# Patient Record
Sex: Male | Born: 1955 | Race: White | Hispanic: No | Marital: Married | Smoking: Former smoker
Health system: Southern US, Community
[De-identification: ages and names within clinical notes are randomized; demographics above are authoritative.]

## PROBLEM LIST (undated history)

## (undated) DIAGNOSIS — I219 Acute myocardial infarction, unspecified: Secondary | ICD-10-CM

## (undated) DIAGNOSIS — I10 Essential (primary) hypertension: Secondary | ICD-10-CM

## (undated) DIAGNOSIS — M1990 Unspecified osteoarthritis, unspecified site: Secondary | ICD-10-CM

## (undated) DIAGNOSIS — E119 Type 2 diabetes mellitus without complications: Secondary | ICD-10-CM

## (undated) DIAGNOSIS — K219 Gastro-esophageal reflux disease without esophagitis: Secondary | ICD-10-CM

## (undated) DIAGNOSIS — I2109 ST elevation (STEMI) myocardial infarction involving other coronary artery of anterior wall: Secondary | ICD-10-CM

## (undated) DIAGNOSIS — I2510 Atherosclerotic heart disease of native coronary artery without angina pectoris: Secondary | ICD-10-CM

## (undated) DIAGNOSIS — F419 Anxiety disorder, unspecified: Secondary | ICD-10-CM

## (undated) DIAGNOSIS — L4050 Arthropathic psoriasis, unspecified: Secondary | ICD-10-CM

## (undated) HISTORY — PX: CARDIAC CATHETERIZATION: SHX172

## (undated) HISTORY — PX: COLONOSCOPY: SHX174

## (undated) HISTORY — DX: Type 2 diabetes mellitus without complications: E11.9

## (undated) HISTORY — PX: EYE SURGERY: SHX253

## (undated) HISTORY — DX: Essential (primary) hypertension: I10

## (undated) HISTORY — PX: OTHER SURGICAL HISTORY: SHX169

## (undated) HISTORY — DX: Atherosclerotic heart disease of native coronary artery without angina pectoris: I25.10

## (undated) HISTORY — PX: KNEE ARTHROPLASTY: SHX992

---
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
## 1993-05-25 DIAGNOSIS — I2109 ST elevation (STEMI) myocardial infarction involving other coronary artery of anterior wall: Secondary | ICD-10-CM

## 1993-05-25 HISTORY — DX: ST elevation (STEMI) myocardial infarction involving other coronary artery of anterior wall: I21.09

## 1999-07-31 ENCOUNTER — Ambulatory Visit (HOSPITAL_COMMUNITY): Admission: RE | Admit: 1999-07-31 | Discharge: 1999-07-31 | Payer: Self-pay | Admitting: Cardiovascular Disease

## 2007-01-19 ENCOUNTER — Ambulatory Visit: Payer: Self-pay | Admitting: Cardiology

## 2007-03-09 ENCOUNTER — Encounter: Admission: RE | Admit: 2007-03-09 | Discharge: 2007-03-09 | Payer: Self-pay | Admitting: Rheumatology

## 2008-08-05 IMAGING — CR DG CHEST 2V
2 series · 2 of 2 positions shown · non-contrast
Comparison: None.

CLINICAL DATA: TB screening.  Ex-smoker.
 CHEST - 2 VIEW:

[w chest pa]
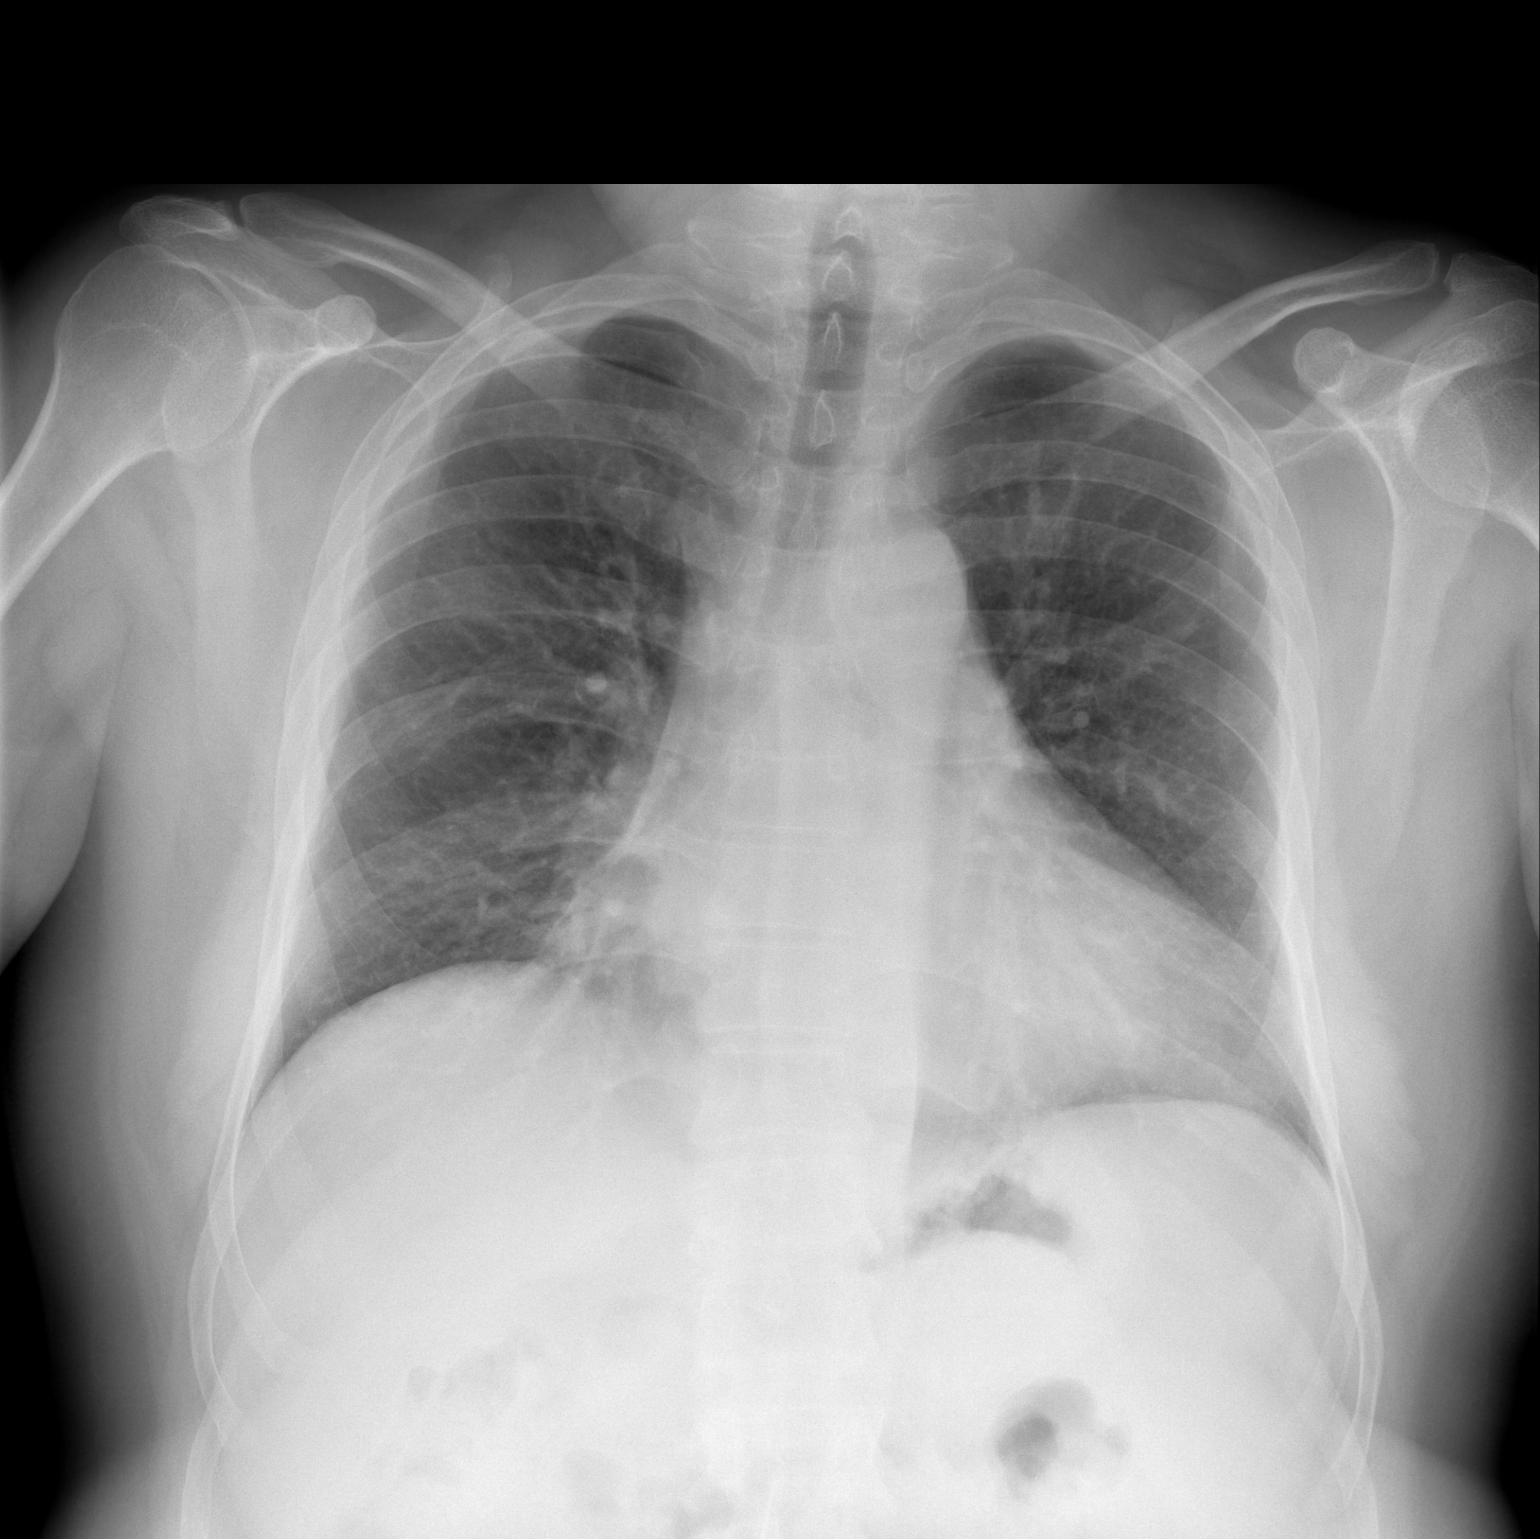

[w chest lat]
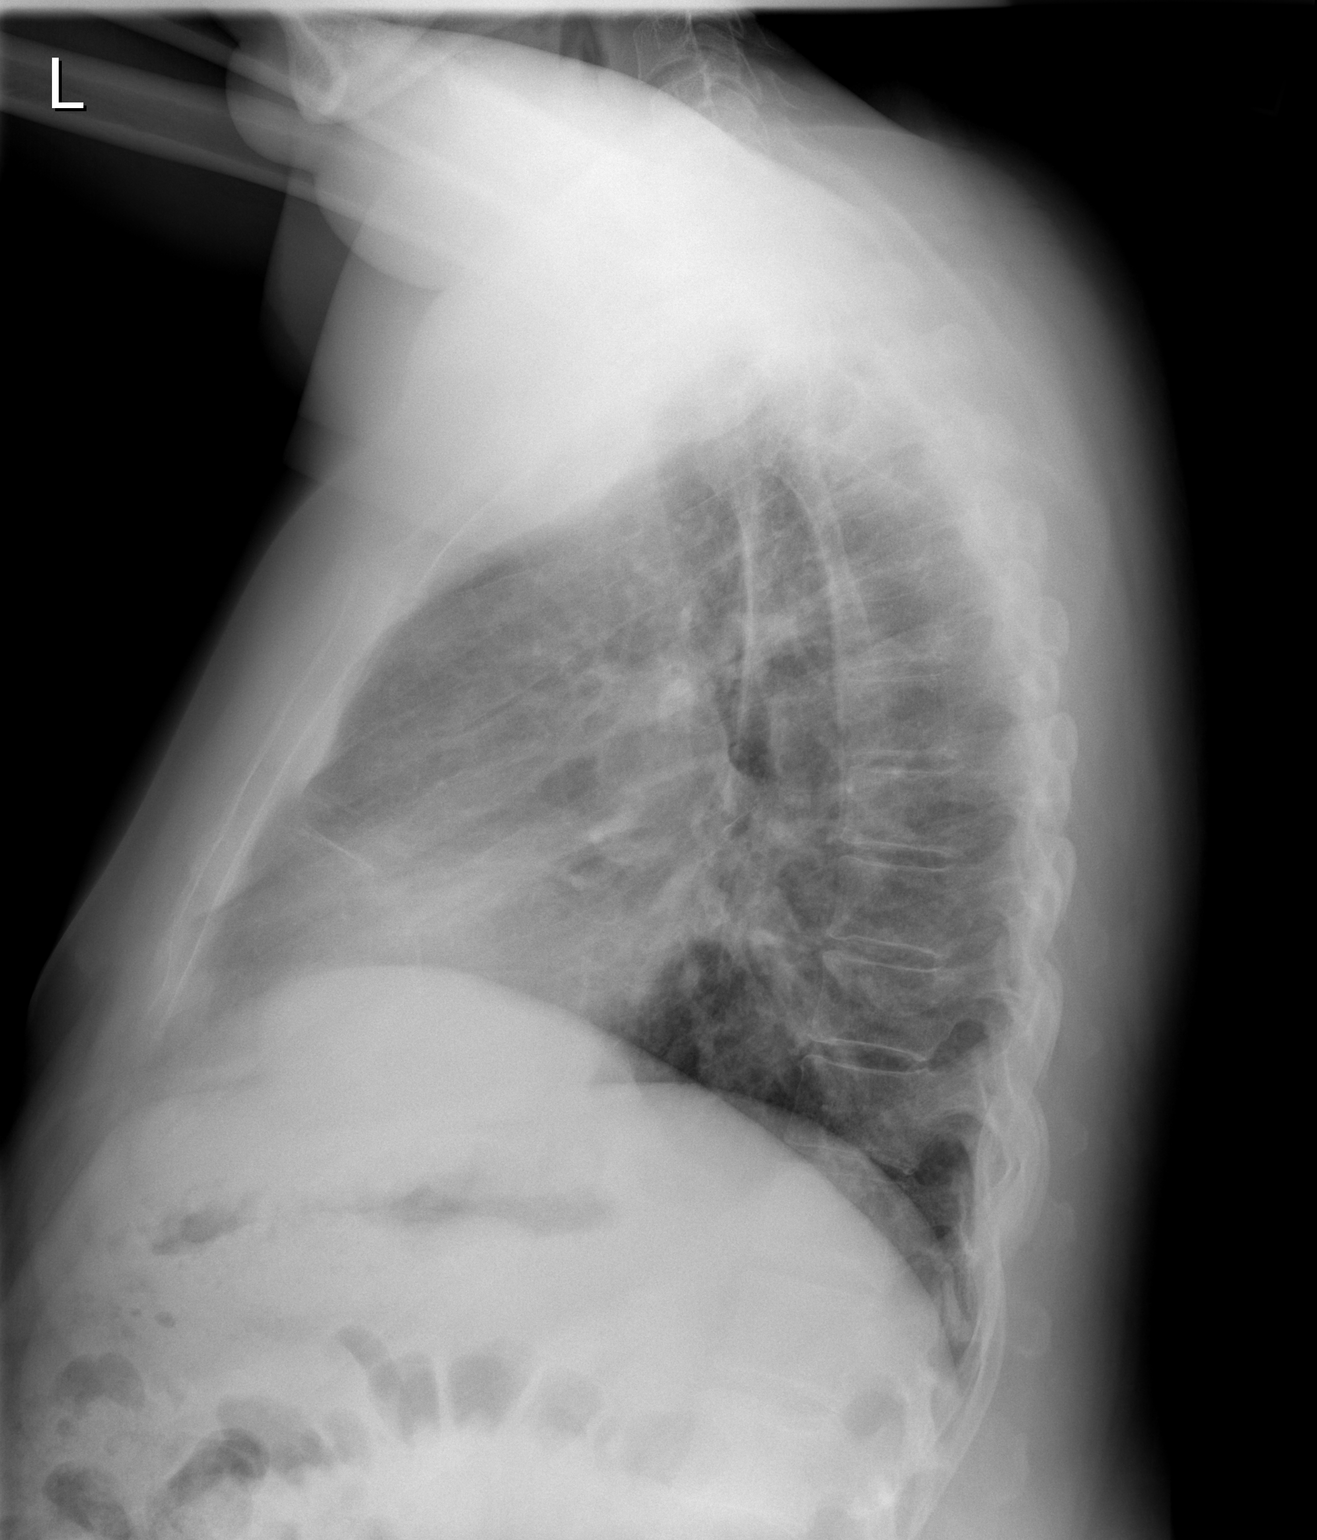

[2 of 2 positions shown; findings below may reference images not displayed]

FINDINGS: Trachea is midline.  Heart size is mildly enlarged.   There is a scar anteriorly on the lateral view.    Lungs are otherwise clear.  No pleural fluid.
IMPRESSION: No acute findings.   No evidence of active tuberculosis.

## 2010-10-10 NOTE — Cardiovascular Report (Signed)
[place]. [facility]  Patient:    Brandon Frazier, Brandon Frazier                          MRN: [id number] Adm. Date:  [id number] Disc. Date: [id number] Attending:  Colon Branch CC:         Dr. Margo Common                        Cardiac Catheterization  INDICATIONS:  Previous angioplasty of the left anterior descending artery in 1995 with an acute anterior MI, recurrent chest pain.  FINDINGS: 1. The left main coronary artery had 20% discrete stenosis. 2. The left anterior descending artery was widely patent at the site of the    previous angioplasty site, and there was 30% multiple discrete lesions in the    mid and distal vessel. 3. The diagonal branches were all small and unchanged from previously. 4. The circumflex coronary artery consisted primarily of one large obtuse marginal    branch which was normal. 5. The right coronary artery was small in caliber.  There were no significant    stenoses.  There was one posterolateral branch and one PDA.  An RAO ventriculography revealed mild anterior apical wall hypokinesis which is  unchanged from previous catheterization.  The ejection fraction is in the 50% range.  There was no gradient across the aortic valve and no MR.  HEMODYNAMIC DATA:  Left ventricular pressure was 130/21, and aortic pressure was 138/74.  IMPRESSION:  The patients chest pain would appear to be noncardiac.  He had no restenosis in his LAD and has not progressed any disease in his right coronary r circumflex.  Continued medical therapy is warranted.  We did Perclose the right  femoral artery at the end of the case with moderately good hemostasis.  The patient had a small hematoma.  He will be watched in the day hospital area and probably  discharged later today.  One gram of Ancef was given in the lab. DD:  07/31/99 TD:  08/01/99 Job: [id number]

## 2010-10-30 ENCOUNTER — Encounter: Payer: Self-pay | Admitting: Family Medicine

## 2010-10-30 ENCOUNTER — Ambulatory Visit (INDEPENDENT_AMBULATORY_CARE_PROVIDER_SITE_OTHER): Payer: BC Managed Care – PPO | Admitting: Family Medicine

## 2010-10-30 VITALS — BP 127/70 | HR 38 | Ht 68.0 in | Wt 175.0 lb

## 2010-10-30 DIAGNOSIS — M25552 Pain in left hip: Secondary | ICD-10-CM | POA: Insufficient documentation

## 2010-10-30 DIAGNOSIS — M25559 Pain in unspecified hip: Secondary | ICD-10-CM

## 2010-10-30 DIAGNOSIS — M25562 Pain in left knee: Secondary | ICD-10-CM

## 2010-10-30 DIAGNOSIS — M25569 Pain in unspecified knee: Secondary | ICD-10-CM

## 2010-10-30 DIAGNOSIS — M2170 Unequal limb length (acquired), unspecified site: Secondary | ICD-10-CM

## 2010-10-30 DIAGNOSIS — M25561 Pain in right knee: Secondary | ICD-10-CM | POA: Insufficient documentation

## 2010-10-30 DIAGNOSIS — M25551 Pain in right hip: Secondary | ICD-10-CM

## 2010-10-30 NOTE — Assessment & Plan Note (Signed)
Rt leg 1.5cm shorter than the left - likely contributing to knee & hip pain. - Heel wedge placed in right shoe to correct difference - this was comfortable in office & provided adequate correction.  Should wear this in all shoes. - Advance activity as tolerated. - f/u prn

## 2010-10-30 NOTE — Assessment & Plan Note (Signed)
Bilateral hip pain likely secondary to leg length difference, although noted to have tight hip muscles. - Heel lift given as noted above - Start lower extremity stretches as shown - Advance activity as tolerated - f/u prn

## 2010-10-30 NOTE — Progress Notes (Signed)
Subjective:    Patient ID: Brandon Frazier, male    DOB: 16-May-1956, 55 y.o.   MRN: [id number]  Knee Pain   Hip Pain    Brandon Frazier present for bilateral knee pain x 2-3 months, with L > R. He is an avid runner but can no longer run one block without experiencing pain.  Pain is located on medial aspects of the knees. Pain flares periodically and is worse with running vs walking. Pt denies any injury but has a history of L knee arthroscopy in the past due to meniscal dysfunction x 2, by Dr Sharion Settler.  He has had Synvisc injections in the last year, but he is not sure if they were helpful. He also has noticed bilateral hip pain, esp with standing for long period of time or getting out of a car.  He takes Aleve occasionally, which has helped, but he is concerned about taking this for an extended period of time.  He has a history of psoriatic arthritis and receiving methotrexate and embrel injections under the care of Dr Nicholes Rough.  PMH: psoriatic arthritis, CAD, HTN, GERD, hyperlipidemia PSH: Lt knee arthroscopy x 2 as noted above ALL: NKDA  Review of Systems  No fever, chills, falling, injury    Objective:   Physical Exam  GEN:  Nad, ao x 3 SKIN: no rashes/lesions RESP: respirations 15 & unlabored MUSC: - Knees: No swelling or redness on exam.  No effusion. No tenderness along joint line.  FROM with mild patello-femoral crepitus.  No ligamentous laxity.  Neg McMurray, neg patellar apprehension, neg patellar grind. - Hips: Palpation did not elicit pain at hip joint, greater troch, or piriformis.  Slightly limited hip internal rotation - Lt >Rt, otherwise Hip flexion and extension intact.  Hip adductors and abductors with good strength.  Neg FABER, neg Ober b/l. - Feet: moderate arch, no foot breakdown - Gait: walks with right foot externally rotated.  Elevation of left hemi-pelvis & noted to have left leg 1.5cm longer than right.   Neurovascularly intact distally.  Assessment & Plan:

## 2010-10-30 NOTE — Progress Notes (Signed)
Subjective:    Patient ID: Brandon Frazier, male    DOB: Jan 10, 1956, 55 y.o.   MRN: [id number]  HPI Brandon Frazier present for bilateral knee pain x 2-3 months, with L > R. He is an avid runner but can no longer run one block without experiencing pain.  Pain is located on medial aspects of the knees. Pain flares periodically and is worse with running vs walking. Pt denies any injury but has a history of L knee arthroscopy in the past due to meniscal dysfunction x 2, by Dr Sharion Settler.  He has had Synvisc injections in the last year, but he is not sure if they were helpful. He also has noticed bilateral hip pain, esp with standing for long period of time or getting out of a car.  He takes Aleve occasionally, which has helped, but he is concerned about taking this for an extended period of time.  He has a history of psoriatic arthritis and receiving methotrexate and embrel injections under the care of Dr Nicholes Rough.  Review of Systems No fever, chills, falling, injury    Objective:   Physical Exam GEN:  Nad, aox 3 MUSC: Knees: No swelling or redness on exam.  No effusion. No tenderness along joint line.  +tenderness along medial epicondyle. +Crepitus.  ROM intact to flexion and extension, anterior and posterior drawer signs wnl.  Lachman's wnl.   Hips:  Palpation did not elicit pain at hip joint.  Hip internal rotation limited compared to external rotation.  Hip flexion and extension intact.  Hip adductors and abductors intact Straight leg raises against  bilateral internal rotation mildly limited.  Leg length: 90 cm on R, 91.5 cm on L    Assessment & Plan:

## 2010-10-30 NOTE — Assessment & Plan Note (Addendum)
Bilateral knee pain - Lt>Rt, likely from leg length discrepancy - We added heel lift to Rt shoe for leg length difference, this should also help with pain. - May have underlying DJD given hx of arthroscopy x 2 which may also be contributing to knee pain - Advance activity as tolerated - f/u prn

## 2010-12-09 ENCOUNTER — Ambulatory Visit (INDEPENDENT_AMBULATORY_CARE_PROVIDER_SITE_OTHER): Payer: BC Managed Care – PPO | Admitting: Sports Medicine

## 2010-12-09 ENCOUNTER — Encounter: Payer: Self-pay | Admitting: Sports Medicine

## 2010-12-09 VITALS — BP 127/82 | HR 41

## 2010-12-09 DIAGNOSIS — M1712 Unilateral primary osteoarthritis, left knee: Secondary | ICD-10-CM

## 2010-12-09 DIAGNOSIS — M2170 Unequal limb length (acquired), unspecified site: Secondary | ICD-10-CM

## 2010-12-09 DIAGNOSIS — M1710 Unilateral primary osteoarthritis, unspecified knee: Secondary | ICD-10-CM

## 2010-12-09 DIAGNOSIS — M25562 Pain in left knee: Secondary | ICD-10-CM

## 2010-12-09 DIAGNOSIS — L409 Psoriasis, unspecified: Secondary | ICD-10-CM

## 2010-12-09 DIAGNOSIS — L408 Other psoriasis: Secondary | ICD-10-CM

## 2010-12-09 DIAGNOSIS — M25569 Pain in unspecified knee: Secondary | ICD-10-CM

## 2010-12-09 MED ORDER — TRAMADOL HCL 50 MG PO TABS: 50.0000 mg | ORAL_TABLET | Freq: Four times a day (QID) | ORAL | Status: AC | PRN

## 2010-12-09 NOTE — Patient Instructions (Addendum)
1. Wear wedges daily in the left shoe.  2. Do straight leg raises daily, 3 sets of 15.  3. Try Devil's Claw or Glucosamine Chondroitin for pain.  4. May use tramadol for continued pain.  5. Limit running for now. Walking, elliptical, and the stationary bike are cardio options.  6. Follow up in 2 months.

## 2010-12-09 NOTE — Progress Notes (Signed)
Subjective:    Patient ID: Brandon Frazier, male    DOB: Aug 07, 1955, 55 y.o.   MRN: [id number]  HPI  Pt presents to clinic for f/u of rt knee pain which he states has completely resolved.  Continues to use heel lift for leg length discrepency. Now presents with lt medial knee pain which is painful intermittently.    Notices pain mostly during and after running or leg workouts, does not have pain with walking, biking, stair climber.  Has hx of arthroscopic surgery on lt knee 1990 and 1997.  Also has had synvisc and cortisone injections for arthritis done by rheumatologist on lt knee.  He has psoriatic arthritis which primarily affected his hands and responded to Enabrel and MTX. Also has bilat posterior heel and AT pain L>R, worse in the mornings.  Pt runs/walks 2-3 times per week, and does weight training 3-4 days per week.    Review of Systems     Objective:   Physical Exam NAD    L knee - Lacks full extension by 5 deg, rt knee lack sfull extension by 2 deg L knee - 125 deg flexion, rt knee 160 deg flexion Neg Mcmurray's  Good abduction strength lt VMO atrophy on lt Lt leg 92.5 cm, rt leg 94 cm    MSK ultrasound There is an effusion that extends about 6 cm above his patella in the left knee. The right medial joint line show significant spurring and arthritic change. There is a loss of cartilage in the mid joint line Quadriceps tendon shows some spurring Patellar tendon unremarkable  Scans of bilateral Achilles tendons were unremarkable    Assessment & Plan:

## 2010-12-10 DIAGNOSIS — L409 Psoriasis, unspecified: Secondary | ICD-10-CM | POA: Insufficient documentation

## 2010-12-10 DIAGNOSIS — M1712 Unilateral primary osteoarthritis, left knee: Secondary | ICD-10-CM | POA: Insufficient documentation

## 2010-12-10 NOTE — Assessment & Plan Note (Signed)
Felt wedged heel lift to left shoe that corrects the left by about 1 cm

## 2010-12-10 NOTE — Assessment & Plan Note (Signed)
This is pretty well controlled with Advil or Aleve and he will continue with these medications  Continue other medications as before  He can consider cortisone injection of the left knee if the pain worsens but the right knee seems very stable

## 2010-12-10 NOTE — Assessment & Plan Note (Signed)
He was given some alternative medication choices  We also placed a felt wedge and left shoe  The flexion contracture the left knee effectively makes the left leg 1/2 cm shorter He was more comfortable walking after this was placed  We discussed limiting activities that might aggravate the knee and substituting other exercises

## 2011-06-04 ENCOUNTER — Other Ambulatory Visit: Payer: Self-pay | Admitting: *Deleted

## 2011-06-04 MED ORDER — KETOPROFEN POWD: Status: DC

## 2011-06-08 ENCOUNTER — Other Ambulatory Visit: Payer: Self-pay | Admitting: *Deleted

## 2011-06-08 MED ORDER — KETOPROFEN POWD: Status: DC

## 2013-08-17 ENCOUNTER — Encounter (HOSPITAL_COMMUNITY): Payer: Self-pay | Admitting: Dietician

## 2013-08-17 NOTE — Progress Notes (Signed)
[name] Diabetes Class Completion  Date:August 17, 2013  Time: 1730  Pt attended [facility]'s Diabetes Group Education Class on August 17, 2013.   Patient was educated on the following topics:   -Survival skills (signs and symptoms of hyperglycemia and hypoglycemia, treatment for hypoglycemia, ideal levels for fasting and postprandial blood sugars, goal Hgb A1c level, foot care basics)  -Recommendations for physical activity   -Carbohydrate metabolism in relation to diabetes   -Meal planning (sources of carbohydrate, carbohydrate counting, meal planning strategies, food label reading, and portion control).  Handouts provided:  -"Diabetes and You: Taking Charge of Your Health"  -"Carbohydrate Counting and Meal Planning"  -"Your Guide to Better Office Visits"   Brandon Kjos A. Brandon Frazier, RD, LDN  

## 2014-02-13 ENCOUNTER — Ambulatory Visit: Payer: Self-pay | Admitting: Orthopedic Surgery

## 2014-02-14 ENCOUNTER — Encounter (HOSPITAL_BASED_OUTPATIENT_CLINIC_OR_DEPARTMENT_OTHER): Payer: Self-pay | Admitting: *Deleted

## 2014-02-14 NOTE — Progress Notes (Signed)
Bring all medications and pack an overnight bag. Dr. Al Corpus reviewed note from Dr. Evette Doffing for surgery. Coming tomorrow for BMET,CBC and EKG.

## 2014-02-15 ENCOUNTER — Encounter (HOSPITAL_BASED_OUTPATIENT_CLINIC_OR_DEPARTMENT_OTHER)
Admission: RE | Admit: 2014-02-15 | Discharge: 2014-02-15 | Disposition: A | Discharge status: Home / Self Care | Payer: BC Managed Care – PPO | Source: Ambulatory Visit | Attending: Orthopedic Surgery | Admitting: Orthopedic Surgery

## 2014-02-15 DIAGNOSIS — M1710 Unilateral primary osteoarthritis, unspecified knee: Secondary | ICD-10-CM | POA: Insufficient documentation

## 2014-02-15 DIAGNOSIS — Z01810 Encounter for preprocedural cardiovascular examination: Secondary | ICD-10-CM | POA: Insufficient documentation

## 2014-02-15 DIAGNOSIS — I10 Essential (primary) hypertension: Secondary | ICD-10-CM | POA: Diagnosis not present

## 2014-02-15 DIAGNOSIS — K219 Gastro-esophageal reflux disease without esophagitis: Secondary | ICD-10-CM | POA: Diagnosis not present

## 2014-02-15 DIAGNOSIS — Z87891 Personal history of nicotine dependence: Secondary | ICD-10-CM | POA: Diagnosis not present

## 2014-02-15 DIAGNOSIS — I252 Old myocardial infarction: Secondary | ICD-10-CM | POA: Diagnosis not present

## 2014-02-15 DIAGNOSIS — L4050 Arthropathic psoriasis, unspecified: Secondary | ICD-10-CM | POA: Diagnosis not present

## 2014-02-15 DIAGNOSIS — F411 Generalized anxiety disorder: Secondary | ICD-10-CM | POA: Diagnosis not present

## 2014-02-15 LAB — CBC
HEMATOCRIT: 43.3 % (ref 39.0–52.0)
HEMOGLOBIN: 15.1 g/dL (ref 13.0–17.0)
MCH: 31.7 pg (ref 26.0–34.0)
MCHC: 34.9 g/dL (ref 30.0–36.0)
MCV: 91 fL (ref 78.0–100.0)
Platelets: 209 10*3/uL (ref 150–400)
RBC: 4.76 MIL/uL (ref 4.22–5.81)
RDW: 13.1 % (ref 11.5–15.5)
WBC: 5.1 10*3/uL (ref 4.0–10.5)

## 2014-02-15 LAB — BASIC METABOLIC PANEL
ANION GAP: 13 (ref 5–15)
BUN: 15 mg/dL (ref 6–23)
CALCIUM: 9.4 mg/dL (ref 8.4–10.5)
CHLORIDE: 102 meq/L (ref 96–112)
CO2: 28 meq/L (ref 19–32)
CREATININE: 0.8 mg/dL (ref 0.50–1.35)
GFR calc Af Amer: >90 mL/min (ref >90)
GFR calc non Af Amer: >90 mL/min (ref >90)
Glucose, Bld: 137 mg/dL — ABNORMAL HIGH (ref 70–99)
Potassium: 4.7 mEq/L (ref 3.7–5.3)
Sodium: 143 mEq/L (ref 137–147)

## 2014-02-16 ENCOUNTER — Ambulatory Visit (HOSPITAL_BASED_OUTPATIENT_CLINIC_OR_DEPARTMENT_OTHER)
Admission: RE | Admit: 2014-02-16 | Discharge: 2014-02-17 | Disposition: A | Discharge status: Home / Self Care | Payer: 59 | Source: Ambulatory Visit | Attending: Orthopedic Surgery | Admitting: Orthopedic Surgery

## 2014-02-16 ENCOUNTER — Encounter (HOSPITAL_BASED_OUTPATIENT_CLINIC_OR_DEPARTMENT_OTHER): Payer: 59 | Admitting: Anesthesiology

## 2014-02-16 ENCOUNTER — Ambulatory Visit (HOSPITAL_BASED_OUTPATIENT_CLINIC_OR_DEPARTMENT_OTHER): Payer: 59 | Admitting: Anesthesiology

## 2014-02-16 ENCOUNTER — Encounter (HOSPITAL_BASED_OUTPATIENT_CLINIC_OR_DEPARTMENT_OTHER)
Admission: RE | Disposition: A | Discharge status: Home / Self Care | Payer: Self-pay | Source: Ambulatory Visit | Attending: Orthopedic Surgery

## 2014-02-16 ENCOUNTER — Encounter (HOSPITAL_BASED_OUTPATIENT_CLINIC_OR_DEPARTMENT_OTHER): Payer: Self-pay

## 2014-02-16 DIAGNOSIS — I252 Old myocardial infarction: Secondary | ICD-10-CM | POA: Insufficient documentation

## 2014-02-16 DIAGNOSIS — F411 Generalized anxiety disorder: Secondary | ICD-10-CM | POA: Insufficient documentation

## 2014-02-16 DIAGNOSIS — I10 Essential (primary) hypertension: Secondary | ICD-10-CM | POA: Insufficient documentation

## 2014-02-16 DIAGNOSIS — Z87891 Personal history of nicotine dependence: Secondary | ICD-10-CM | POA: Insufficient documentation

## 2014-02-16 DIAGNOSIS — L4050 Arthropathic psoriasis, unspecified: Secondary | ICD-10-CM | POA: Insufficient documentation

## 2014-02-16 DIAGNOSIS — M1710 Unilateral primary osteoarthritis, unspecified knee: Secondary | ICD-10-CM | POA: Diagnosis present

## 2014-02-16 DIAGNOSIS — M1712 Unilateral primary osteoarthritis, left knee: Secondary | ICD-10-CM

## 2014-02-16 DIAGNOSIS — K219 Gastro-esophageal reflux disease without esophagitis: Secondary | ICD-10-CM | POA: Insufficient documentation

## 2014-02-16 HISTORY — DX: Gastro-esophageal reflux disease without esophagitis: K21.9

## 2014-02-16 HISTORY — DX: Essential (primary) hypertension: I10

## 2014-02-16 HISTORY — PX: PARTIAL KNEE ARTHROPLASTY: SHX2174

## 2014-02-16 HISTORY — DX: Anxiety disorder, unspecified: F41.9

## 2014-02-16 HISTORY — DX: Unspecified osteoarthritis, unspecified site: M19.90

## 2014-02-16 HISTORY — DX: Acute myocardial infarction, unspecified: I21.9

## 2014-02-16 SURGERY — ARTHROPLASTY, KNEE, UNICOMPARTMENTAL
Anesthesia: General | Site: Knee | Laterality: Left

## 2014-02-16 MED ORDER — ONDANSETRON HCL 4 MG PO TABS: 4.0000 mg | ORAL_TABLET | Freq: Three times a day (TID) | ORAL | Status: DC | PRN

## 2014-02-16 MED ORDER — FENTANYL CITRATE 0.05 MG/ML IJ SOLN
INTRAMUSCULAR | Status: AC
  Filled 2014-02-16: qty 2

## 2014-02-16 MED ORDER — MORPHINE SULFATE 10 MG/ML IJ SOLN
INTRAMUSCULAR | Status: AC
  Filled 2014-02-16: qty 1

## 2014-02-16 MED ORDER — HYDROMORPHONE HCL 1 MG/ML IJ SOLN: 0.5000 mg | INTRAMUSCULAR | Status: DC | PRN

## 2014-02-16 MED ORDER — CEFAZOLIN SODIUM 1-5 GM-% IV SOLN
1.0000 g | Freq: Four times a day (QID) | INTRAVENOUS | Status: AC
  Administered 2014-02-16 – 2014-02-17 (×3): 1 g via INTRAVENOUS
  Filled 2014-02-16: qty 50

## 2014-02-16 MED ORDER — OXYCODONE HCL 5 MG PO TABS: 5.0000 mg | ORAL_TABLET | ORAL | Status: DC | PRN

## 2014-02-16 MED ORDER — EPHEDRINE SULFATE 50 MG/ML IJ SOLN
INTRAMUSCULAR | Status: DC | PRN
  Administered 2014-02-16: 10 mg via INTRAVENOUS

## 2014-02-16 MED ORDER — CEFAZOLIN SODIUM 1-5 GM-% IV SOLN
INTRAVENOUS | Status: AC
  Filled 2014-02-16: qty 50

## 2014-02-16 MED ORDER — METHOCARBAMOL 500 MG PO TABS: 500.0000 mg | ORAL_TABLET | Freq: Four times a day (QID) | ORAL | Status: DC | PRN

## 2014-02-16 MED ORDER — OXYCODONE HCL 5 MG PO TABS: 5.0000 mg | ORAL_TABLET | Freq: Once | ORAL | Status: AC | PRN

## 2014-02-16 MED ORDER — PANTOPRAZOLE SODIUM 40 MG PO TBEC: 40.0000 mg | DELAYED_RELEASE_TABLET | Freq: Every day | ORAL | Status: DC

## 2014-02-16 MED ORDER — MIDAZOLAM HCL 2 MG/2ML IJ SOLN
INTRAMUSCULAR | Status: AC
  Filled 2014-02-16: qty 2

## 2014-02-16 MED ORDER — ONDANSETRON HCL 4 MG/2ML IJ SOLN: 4.0000 mg | Freq: Once | INTRAMUSCULAR | Status: AC | PRN

## 2014-02-16 MED ORDER — OXYCODONE HCL 5 MG/5ML PO SOLN: 5.0000 mg | Freq: Once | ORAL | Status: AC | PRN

## 2014-02-16 MED ORDER — FENTANYL CITRATE 0.05 MG/ML IJ SOLN
50.0000 ug | INTRAMUSCULAR | Status: DC | PRN
  Administered 2014-02-16: 100 ug via INTRAVENOUS

## 2014-02-16 MED ORDER — METHOCARBAMOL 1000 MG/10ML IJ SOLN: 500.0000 mg | Freq: Four times a day (QID) | INTRAVENOUS | Status: DC | PRN

## 2014-02-16 MED ORDER — METOCLOPRAMIDE HCL 5 MG PO TABS: 5.0000 mg | ORAL_TABLET | Freq: Three times a day (TID) | ORAL | Status: DC | PRN

## 2014-02-16 MED ORDER — PHENYLEPHRINE HCL 10 MG/ML IJ SOLN
10.0000 mg | INTRAVENOUS | Status: DC | PRN
  Administered 2014-02-16: 50 ug/min via INTRAVENOUS

## 2014-02-16 MED ORDER — MORPHINE SULFATE 10 MG/ML IJ SOLN
INTRAMUSCULAR | Status: DC | PRN
Start: 2014-02-16 — End: 2014-02-16
  Administered 2014-02-16: 2 mg via INTRAVENOUS

## 2014-02-16 MED ORDER — SIMVASTATIN 20 MG PO TABS
20.0000 mg | ORAL_TABLET | Freq: Every day | ORAL | Status: DC
  Administered 2014-02-16: 20 mg via ORAL

## 2014-02-16 MED ORDER — ZOLPIDEM TARTRATE 5 MG PO TABS: 5.0000 mg | ORAL_TABLET | Freq: Every evening | ORAL | Status: DC | PRN

## 2014-02-16 MED ORDER — DOCUSATE SODIUM 100 MG PO CAPS
100.0000 mg | ORAL_CAPSULE | Freq: Two times a day (BID) | ORAL | Status: DC
Start: 2014-02-16 — End: 2014-02-17

## 2014-02-16 MED ORDER — BENAZEPRIL-HYDROCHLOROTHIAZIDE 20-25 MG PO TABS: 1.0000 | ORAL_TABLET | Freq: Every day | ORAL | Status: DC

## 2014-02-16 MED ORDER — BISACODYL 10 MG RE SUPP: 10.0000 mg | Freq: Every day | RECTAL | Status: DC | PRN

## 2014-02-16 MED ORDER — SODIUM CHLORIDE 0.9 % IV SOLN
INTRAVENOUS | Status: DC
  Administered 2014-02-16: 15:00:00 via INTRAVENOUS

## 2014-02-16 MED ORDER — METOCLOPRAMIDE HCL 5 MG/ML IJ SOLN: 5.0000 mg | Freq: Three times a day (TID) | INTRAMUSCULAR | Status: DC | PRN

## 2014-02-16 MED ORDER — LACTATED RINGERS IV SOLN
INTRAVENOUS | Status: DC
  Administered 2014-02-16: 11:00:00 via INTRAVENOUS

## 2014-02-16 MED ORDER — PROPOFOL 10 MG/ML IV BOLUS
INTRAVENOUS | Status: DC | PRN
  Administered 2014-02-16: 200 mg via INTRAVENOUS

## 2014-02-16 MED ORDER — MAGNESIUM CITRATE PO SOLN: 1.0000 | Freq: Once | ORAL | Status: AC | PRN

## 2014-02-16 MED ORDER — OXYCODONE-ACETAMINOPHEN 5-325 MG PO TABS
1.0000 | ORAL_TABLET | ORAL | Status: DC | PRN
  Administered 2014-02-16: 1 via ORAL
  Administered 2014-02-16: 2 via ORAL
  Administered 2014-02-16: 1 via ORAL
  Administered 2014-02-17 (×2): 2 via ORAL
  Filled 2014-02-16 (×2): qty 2
  Filled 2014-02-16 (×2): qty 1
  Filled 2014-02-16: qty 2

## 2014-02-16 MED ORDER — BACLOFEN 10 MG PO TABS: 10.0000 mg | ORAL_TABLET | Freq: Three times a day (TID) | ORAL | Status: DC

## 2014-02-16 MED ORDER — POLYETHYLENE GLYCOL 3350 17 G PO PACK: 17.0000 g | PACK | Freq: Every day | ORAL | Status: DC | PRN

## 2014-02-16 MED ORDER — FOLIC ACID 1 MG PO TABS: 1.0000 mg | ORAL_TABLET | Freq: Every day | ORAL | Status: DC

## 2014-02-16 MED ORDER — PAROXETINE HCL 10 MG PO TABS
10.0000 mg | ORAL_TABLET | Freq: Every day | ORAL | Status: DC
  Administered 2014-02-16: 10 mg via ORAL

## 2014-02-16 MED ORDER — MIDAZOLAM HCL 2 MG/2ML IJ SOLN
1.0000 mg | INTRAMUSCULAR | Status: DC | PRN
  Administered 2014-02-16: 2 mg via INTRAVENOUS

## 2014-02-16 MED ORDER — DEXAMETHASONE SODIUM PHOSPHATE 4 MG/ML IJ SOLN
INTRAMUSCULAR | Status: DC | PRN
  Administered 2014-02-16: 10 mg via INTRAVENOUS

## 2014-02-16 MED ORDER — RIVAROXABAN 10 MG PO TABS: 10.0000 mg | ORAL_TABLET | Freq: Every day | ORAL | Status: DC

## 2014-02-16 MED ORDER — ONDANSETRON HCL 4 MG PO TABS: 4.0000 mg | ORAL_TABLET | Freq: Four times a day (QID) | ORAL | Status: DC | PRN

## 2014-02-16 MED ORDER — HYDROMORPHONE HCL 1 MG/ML IJ SOLN: 0.2500 mg | INTRAMUSCULAR | Status: DC | PRN

## 2014-02-16 MED ORDER — BUPIVACAINE-EPINEPHRINE (PF) 0.5% -1:200000 IJ SOLN
INTRAMUSCULAR | Status: DC | PRN
  Administered 2014-02-16: 30 mL via PERINEURAL

## 2014-02-16 MED ORDER — LIDOCAINE HCL (CARDIAC) 20 MG/ML IV SOLN
INTRAVENOUS | Status: DC | PRN
  Administered 2014-02-16: 60 mg via INTRAVENOUS

## 2014-02-16 MED ORDER — SENNA-DOCUSATE SODIUM 8.6-50 MG PO TABS: 2.0000 | ORAL_TABLET | Freq: Every day | ORAL | Status: DC

## 2014-02-16 MED ORDER — CEFAZOLIN SODIUM-DEXTROSE 2-3 GM-% IV SOLR
2.0000 g | INTRAVENOUS | Status: AC
  Administered 2014-02-16: 2 g via INTRAVENOUS

## 2014-02-16 MED ORDER — SENNA 8.6 MG PO TABS: 1.0000 | ORAL_TABLET | Freq: Two times a day (BID) | ORAL | Status: DC

## 2014-02-16 MED ORDER — ONDANSETRON HCL 4 MG/2ML IJ SOLN: 4.0000 mg | Freq: Four times a day (QID) | INTRAMUSCULAR | Status: DC | PRN

## 2014-02-16 MED ORDER — CEFAZOLIN SODIUM-DEXTROSE 2-3 GM-% IV SOLR
INTRAVENOUS | Status: AC
  Filled 2014-02-16: qty 50

## 2014-02-16 MED ORDER — OXYCODONE-ACETAMINOPHEN 10-325 MG PO TABS: 1.0000 | ORAL_TABLET | Freq: Four times a day (QID) | ORAL | Status: DC | PRN

## 2014-02-16 SURGICAL SUPPLY — 71 items
BANDAGE ELASTIC 6 VELCRO ST LF (GAUZE/BANDAGES/DRESSINGS) ×3 IMPLANT
BANDAGE ESMARK 6X9 LF (GAUZE/BANDAGES/DRESSINGS) ×1 IMPLANT
BENZOIN TINCTURE PRP APPL 2/3 (GAUZE/BANDAGES/DRESSINGS) ×3 IMPLANT
BLADE SURG 10 STRL SS (BLADE) ×3 IMPLANT
BLADE SURG 15 STRL LF DISP TIS (BLADE) ×2 IMPLANT
BLADE SURG 15 STRL SS (BLADE) ×4
BNDG ESMARK 6X9 LF (GAUZE/BANDAGES/DRESSINGS) ×3
BOWL SMART MIX CTS (DISPOSABLE) ×3 IMPLANT
CANISTER SUCT 1200ML W/VALVE (MISCELLANEOUS) IMPLANT
CAPT KNEE OXFORD ×3 IMPLANT
CEMENT HV SMART SET (Cement) ×3 IMPLANT
CLOSURE STERI-STRIP 1/2X4 (GAUZE/BANDAGES/DRESSINGS) ×1
CLSR STERI-STRIP ANTIMIC 1/2X4 (GAUZE/BANDAGES/DRESSINGS) ×2 IMPLANT
COVER TABLE BACK 60X90 (DRAPES) ×3 IMPLANT
CUFF TOURNIQUET SINGLE 34IN LL (TOURNIQUET CUFF) ×3 IMPLANT
DECANTER SPIKE VIAL GLASS SM (MISCELLANEOUS) IMPLANT
DRAPE EXTREMITY TIBURON (DRAPES) ×3 IMPLANT
DRAPE U 20/CS (DRAPES) ×3 IMPLANT
DRAPE U-SHAPE 47X51 STRL (DRAPES) ×3 IMPLANT
DRSG PAD ABDOMINAL 8X10 ST (GAUZE/BANDAGES/DRESSINGS) ×3 IMPLANT
DURAPREP 26ML APPLICATOR (WOUND CARE) ×3 IMPLANT
ELECT REM PT RETURN 9FT ADLT (ELECTROSURGICAL) ×3
ELECTRODE REM PT RTRN 9FT ADLT (ELECTROSURGICAL) ×1 IMPLANT
FACESHIELD WRAPAROUND (MASK) ×6 IMPLANT
GAUZE SPONGE 4X4 12PLY STRL (GAUZE/BANDAGES/DRESSINGS) ×3 IMPLANT
GLOVE BIO SURGEON STRL SZ7.5 (GLOVE) ×3 IMPLANT
GLOVE BIO SURGEON STRL SZ8 (GLOVE) ×3 IMPLANT
GLOVE BIOGEL PI IND STRL 7.0 (GLOVE) ×2 IMPLANT
GLOVE BIOGEL PI IND STRL 8 (GLOVE) ×2 IMPLANT
GLOVE BIOGEL PI INDICATOR 7.0 (GLOVE) ×4
GLOVE BIOGEL PI INDICATOR 8 (GLOVE) ×4
GLOVE ECLIPSE 6.5 STRL STRAW (GLOVE) ×3 IMPLANT
GLOVE ORTHO TXT STRL SZ7.5 (GLOVE) ×3 IMPLANT
GOWN STRL REUS W/ TWL LRG LVL3 (GOWN DISPOSABLE) ×1 IMPLANT
GOWN STRL REUS W/ TWL XL LVL3 (GOWN DISPOSABLE) ×2 IMPLANT
GOWN STRL REUS W/TWL LRG LVL3 (GOWN DISPOSABLE) ×2
GOWN STRL REUS W/TWL XL LVL3 (GOWN DISPOSABLE) ×4
HANDPIECE INTERPULSE COAX TIP (DISPOSABLE) ×2
IMMOBILIZER KNEE 22 UNIV (SOFTGOODS) IMPLANT
IMMOBILIZER KNEE 24 THIGH 36 (MISCELLANEOUS) ×1 IMPLANT
IMMOBILIZER KNEE 24 UNIV (MISCELLANEOUS) ×3
MANIFOLD NEPTUNE II (INSTRUMENTS) ×3 IMPLANT
NS IRRIG 1000ML POUR BTL (IV SOLUTION) ×3 IMPLANT
PACK ARTHROSCOPY DSU (CUSTOM PROCEDURE TRAY) ×3 IMPLANT
PACK BASIN DAY SURGERY FS (CUSTOM PROCEDURE TRAY) ×3 IMPLANT
PENCIL BUTTON HOLSTER BLD 10FT (ELECTRODE) ×3 IMPLANT
SAWBLADE OXFORD PARTIAL (BLADE) ×3 IMPLANT
SET HNDPC FAN SPRY TIP SCT (DISPOSABLE) ×1 IMPLANT
SHEET MEDIUM DRAPE 40X70 STRL (DRAPES) IMPLANT
SLEEVE SCD COMPRESS KNEE MED (MISCELLANEOUS) ×3 IMPLANT
SPONGE LAP 18X18 X RAY DECT (DISPOSABLE) ×3 IMPLANT
STAPLER VISISTAT 35W (STAPLE) IMPLANT
SUCTION FRAZIER TIP 10 FR DISP (SUCTIONS) ×3 IMPLANT
SUT ETHILON 3 0 PS 1 (SUTURE) IMPLANT
SUT ETHILON 4 0 PS 2 18 (SUTURE) IMPLANT
SUT MNCRL AB 4-0 PS2 18 (SUTURE) IMPLANT
SUT VIC AB 0 CT1 27 (SUTURE) ×2
SUT VIC AB 0 CT1 27XBRD ANBCTR (SUTURE) ×1 IMPLANT
SUT VIC AB 2-0 SH 27 (SUTURE) ×2
SUT VIC AB 2-0 SH 27XBRD (SUTURE) ×1 IMPLANT
SUT VIC AB 3-0 SH 27 (SUTURE)
SUT VIC AB 3-0 SH 27X BRD (SUTURE) IMPLANT
SUT VICRYL 3-0 CR8 SH (SUTURE) ×3 IMPLANT
SUT VICRYL 4-0 PS2 18IN ABS (SUTURE) IMPLANT
SYR BULB IRRIGATION 50ML (SYRINGE) ×3 IMPLANT
TOWEL OR 17X24 6PK STRL BLUE (TOWEL DISPOSABLE) ×6 IMPLANT
TOWEL OR NON WOVEN STRL DISP B (DISPOSABLE) ×6 IMPLANT
TUBE CONNECTING 20'X1/4 (TUBING)
TUBE CONNECTING 20X1/4 (TUBING) IMPLANT
UNDERPAD 30X30 INCONTINENT (UNDERPADS AND DIAPERS) IMPLANT
YANKAUER SUCT BULB TIP NO VENT (SUCTIONS) ×3 IMPLANT

## 2014-02-16 NOTE — Anesthesia Preprocedure Evaluation (Addendum)
Anesthesia Evaluation  Patient identified by MRN, date of birth, ID band Patient awake    Reviewed: Allergy & Precautions, H&P , NPO status , Patient's Chart, lab work & pertinent test results  Airway Mallampati: I TM Distance: >3 FB Neck ROM: Full    Dental  (+) Teeth Intact, Dental Advisory Given   Pulmonary former smoker,  breath sounds clear to auscultation        Cardiovascular hypertension, Pt. on medications + Past MI Rhythm:Regular Rate:Normal     Neuro/Psych    GI/Hepatic GERD-  Medicated and Controlled,  Endo/Other    Renal/GU      Musculoskeletal   Abdominal   Peds  Hematology   Anesthesia Other Findings   Reproductive/Obstetrics                          Anesthesia Physical Anesthesia Plan  ASA: III  Anesthesia Plan: General   Post-op Pain Management:    Induction: Intravenous  Airway Management Planned: LMA  Additional Equipment:   Intra-op Plan:   Post-operative Plan: Extubation in OR  Informed Consent: I have reviewed the patients History and Physical, chart, labs and discussed the procedure including the risks, benefits and alternatives for the proposed anesthesia with the patient or authorized representative who has indicated his/her understanding and acceptance.   Dental advisory given  Plan Discussed with: CRNA and Anesthesiologist  Anesthesia Plan Comments:         Anesthesia Quick Evaluation

## 2014-02-16 NOTE — H&P (Signed)
PREOPERATIVE H&P  Chief Complaint: LEFT KNEE DEGENERATIVE ARTRITIS  HPI: Brandon Frazier is a 58 y.o. male who presents for preoperative history and physical with a diagnosis of LEFT KNEE DEGENERATIVE ARTRITIS. Symptoms are rated as moderate to severe, and have been worsening.  This is significantly impairing activities of daily living.  He has elected for surgical management. He has failed injections, activity modification, anti-inflammatories, and assistive devices.    Past Medical History  Diagnosis Date  . Hypertension   . Myocardial infarction     1997  . Anxiety   . GERD (gastroesophageal reflux disease)   . Arthritis     psoriatic arthritis   Past Surgical History  Procedure Laterality Date  . Left knee arthroscopy      1990, 1997  . Cardiac catheterization      [id number]   History   Social History  . Marital Status: Married    Spouse Name: N/A    Number of Children: N/A  . Years of Education: N/A   Social History Main Topics  . Smoking status: Former Research scientist (life sciences)  . Smokeless tobacco: Never Used     Comment: Quit smoking 18 years ago  . Alcohol Use: Yes     Comment: Drinks 1-2 times a year  . Drug Use: No  . Sexual Activity: Not on file   Other Topics Concern  . Not on file   Social History Narrative  . No narrative on file   No family history on file. Allergies  Allergen Reactions  . Aspirin Other (See Comments)    Burns stomach   Prior to Admission medications   Medication Sig Start Date End Date Taking? Authorizing Provider  Adalimumab (HUMIRA PEN [place]) Inject 40 mg into the skin.   Yes Historical Provider, MD  benazepril-hydrochlorthiazide (LOTENSIN HCT) 20-25 MG per tablet Take 1 tablet by mouth daily.      Historical Provider, MD  folic acid (FOLVITE) 1 MG tablet Take 1 mg by mouth Twice daily. 12/03/10   Historical Provider, MD  METHOTREXATE SODIUM IJ Inject 1 mL as directed once a week.     Historical Provider, MD  omeprazole (PRILOSEC) 20 MG capsule  Take 20 mg by mouth daily.      Historical Provider, MD  PARoxetine (PAXIL) 10 MG tablet Take 10 mg by mouth daily.      Historical Provider, MD  pravastatin (PRAVACHOL) 40 MG tablet Take 40 mg by mouth daily.      Historical Provider, MD     Positive ROS: All other systems have been reviewed and were otherwise negative with the exception of those mentioned in the HPI and as above.  Physical Exam: General: Alert, no acute distress Cardiovascular: No pedal edema Respiratory: No cyanosis, no use of accessory musculature GI: No organomegaly, abdomen is soft and non-tender Skin: No lesions in the area of chief complaint Neurologic: Sensation intact distally Psychiatric: Patient is competent for consent with normal mood and affect Lymphatic: No axillary or cervical lymphadenopathy  MUSCULOSKELETAL: Anterior cruciate ligament is intact, range of motion is 0-130, stable to varus and valgus stress but he does have 0 laxity. No patellar grind.  X-rays demonstrate varus osteoarthritis with anteromedial degenerative changes with subchondral sclerosis, osteophyte formation and loss of joint space.  Assessment: LEFT KNEE DEGENERATIVE ARTRITIS  Plan: Plan for Procedure(s): LEFT KNEE UNI ARTHROPLASTY  The risks benefits and alternatives were discussed with the patient including but not limited to the risks of nonoperative treatment, versus surgical intervention  including infection, bleeding, nerve injury,  blood clots, cardiopulmonary complications, morbidity, mortality, among others, and they were willing to proceed.   Johnny Bridge, MD Cell [phone]   02/16/2014 7:27 AM  '

## 2014-02-16 NOTE — Op Note (Signed)
02/16/2014  1:27 PM  PATIENT:  Brandon Frazier    PRE-OPERATIVE DIAGNOSIS:  LEFT KNEE DEGENERATIVE ARTRITIS  POST-OPERATIVE DIAGNOSIS:  Same  PROCEDURE:  LEFT KNEE UNI ARTHROPLASTY  SURGEON:  Johnny Bridge, MD  PHYSICIAN ASSISTANT: Joya Gaskins, OPA-C, present and scrubbed throughout the case, critical for completion in a timely fashion, and for retraction, instrumentation, and closure.  ANESTHESIA:   General  PREOPERATIVE INDICATIONS:  Brandon Frazier is a  58 y.o. male with a diagnosis of LEFT KNEE DEGENERATIVE ARTRITIS who failed conservative measures and elected for surgical management.    The risks benefits and alternatives were discussed with the patient preoperatively including but not limited to the risks of infection, bleeding, nerve injury, cardiopulmonary complications, blood clots, the need for revision surgery, among others, and the patient was willing to proceed.  OPERATIVE IMPLANTS: Biomet Oxford mobile bearing medial compartment arthroplasty femur size small, tibia size C, bearing size 4.  OPERATIVE FINDINGS: Endstage grade 4 medial compartment osteoarthritis. No significant changes in the lateral or patellofemoral joint.  The ACL was intact.  OPERATIVE PROCEDURE: The patient was brought to the operating room placed in supine position. General anesthesia was administered. IV antibiotics were given. The lower extremity was placed in the legholder and prepped and draped in usual sterile fashion.  Time out was performed.  The leg was elevated and exsanguinated and the tourniquet was inflated. Anteromedial incision was performed, and I took care to preserve the MCL. Parapatellar incision was carried out, and the osteophytes were excised, along with the medial meniscus and a small portion of the fat pad.  The extra medullary tibial cutting jig was applied, using the spoon and the 76mm G-Clamp and the 2 mm shim, and I took care to protect the anterior cruciate ligament  insertion and the tibial spine. The medial collateral ligament was also protected, and I resected my proximal tibia, matching the anatomic slope.   The proximal tibial bony cut was removed in one piece, and I turned my attention to the femur.  The intramedullary femoral rod was placed using the drill, and then using the appropriate reference, I assembled the femoral jig, setting my posterior cutting block. I resected my posterior femur, used the 0 spigot for the anterior femur, and then measured my gap.   I then used the 4 mill to match the extension gap to the flexion gap.  The gaps were then measured again with the appropriate feeler gauges. Once I had balanced flexion and extension gaps, I then completed the preparation of the femur.  I milled off the anterior aspect of the distal femur to prevent impingement. I also exposed the tibia, and selected the above-named component, and then used the cutting jig to prepare the keel slot on the tibia. I also used the awl to curette out the bone to complete the preparation of the keel. The back wall was intact.  I then placed trial components, and it was found to have excellent motion, and appropriate balance.  I then cemented the components into place, cementing the tibia first, removing all excess cement, and then cementing the femur.  All loose cement was removed.  The real polyethylene insert was applied manually, and the knee was taken through functional range of motion, and found to have excellent stability and restoration of joint motion, with excellent balance.  The wounds were irrigated copiously, and the parapatellar tissue closed with Vicryl, followed by Vicryl for the subcutaneous tissue, with routine closure with Steri-Strips and  sterile gauze.  The tourniquet was released, and the patient was awakened and extubated and returned to PACU in stable and satisfactory condition. There were no complications.

## 2014-02-16 NOTE — Transfer of Care (Signed)
Immediate Anesthesia Transfer of Care Note  Patient: Brandon Frazier  Procedure(s) Performed: Procedure(s): LEFT KNEE UNI ARTHROPLASTY (Left)  Patient Location: PACU  Anesthesia Type:General  Level of Consciousness: awake and sedated  Airway & Oxygen Therapy: Patient Spontanous Breathing and Patient connected to face mask oxygen  Post-op Assessment: Report given to PACU RN and Post -op Vital signs reviewed and stable  Post vital signs: Reviewed and stable  Complications: No apparent anesthesia complications

## 2014-02-16 NOTE — Progress Notes (Signed)
Assisted Dr. Al Corpus with left, ultrasound guided, femoral block. Side rails up, monitors on throughout procedure. See vital signs in flow sheet. Tolerated Procedure well.

## 2014-02-16 NOTE — Anesthesia Postprocedure Evaluation (Signed)
  Anesthesia Post-op Note  Patient: Brandon Frazier  Procedure(s) Performed: Procedure(s): LEFT KNEE UNI ARTHROPLASTY (Left)  Patient Location: PACU  Anesthesia Type: General with femoral block   Level of Consciousness: awake, alert  and oriented  Airway and Oxygen Therapy: Patient Spontanous Breathing  Post-op Pain: none  Post-op Assessment: Post-op Vital signs reviewed  Post-op Vital Signs: Reviewed  Last Vitals:  Filed Vitals:   02/16/14 1425  BP:   Pulse: 65  Temp:   Resp: 18    Complications: No apparent anesthesia complications

## 2014-02-16 NOTE — Anesthesia Procedure Notes (Addendum)
Anesthesia Regional Block:  Femoral nerve block  Pre-Anesthetic Checklist: ,, timeout performed, Correct Patient, Correct Site, Correct Laterality, Correct Procedure, Correct Position, site marked, Risks and benefits discussed,  Surgical consent,  Pre-op evaluation,  At surgeon's request and post-op pain management  Laterality: Left and Lower  Prep: chloraprep       Needles:  Injection technique: Single-shot  Needle Type: Echogenic Needle     Needle Length: 9cm 9 cm Needle Gauge: 21 and 21 G    Additional Needles:  Procedures: ultrasound guided (picture in chart) Femoral nerve block Narrative:  Start time: 02/16/2014 11:23 AM End time: 02/16/2014 11:29 AM Injection made incrementally with aspirations every 5 mL.  Performed by: Personally  Anesthesiologist: Lorrene Reid, MD   Procedure Name: LMA Insertion Performed by: Terrance Mass Pre-anesthesia Checklist: Patient identified, Timeout performed, Emergency Drugs available, Suction available and Patient being monitored Patient Re-evaluated:Patient Re-evaluated prior to inductionOxygen Delivery Method: Circle system utilized Preoxygenation: Pre-oxygenation with 100% oxygen Ventilation: Mask ventilation without difficulty LMA: LMA inserted LMA Size: 4.0 Number of attempts: 1 Placement Confirmation: positive ETCO2 Tube secured with: Tape Dental Injury: Teeth and Oropharynx as per pre-operative assessment

## 2014-02-16 NOTE — Discharge Instructions (Signed)
Diet: As you were doing prior to hospitalization  ° °Shower:  May shower but keep the wounds dry, use an occlusive plastic wrap, NO SOAKING IN TUB.  If the bandage gets wet, change with a clean dry gauze. ° °Dressing:  You may change your dressing 3-5 days after surgery.  Then change the dressing daily with sterile gauze dressing.   ° °There are sticky tapes (steri-strips) on your wounds and all the stitches are absorbable.  Leave the steri-strips in place when changing your dressings, they will peel off with time, usually 2-3 weeks. ° °Activity:  Increase activity slowly as tolerated, but follow the weight bearing instructions below.  No lifting or driving for 6 weeks. ° °Weight Bearing:   As tolerated.   ° °To prevent constipation: you may use a stool softener such as - ° °Colace (over the counter) 100 mg by mouth twice a day  °Drink plenty of fluids (prune juice may be helpful) and high fiber foods °Miralax (over the counter) for constipation as needed.   ° °Itching:  If you experience itching with your medications, try taking only a single pain pill, or even half a pain pill at a time.  You may take up to 10 pain pills per day, and you can also use benadryl over the counter for itching or also to help with sleep.  ° °Precautions:  If you experience chest pain or shortness of breath - call 911 immediately for transfer to the hospital emergency department!! ° °If you develop a fever greater that 101 F, purulent drainage from wound, increased redness or drainage from wound, or calf pain -- Call the office at [phone]                                                °Follow- Up Appointment:  Please call for an appointment to be seen in 2 weeks [place] - [phone] ° ° ° ° ° °Post Anesthesia Home Care Instructions ° °Activity: °Get plenty of rest for the remainder of the day. A responsible adult should stay with you for 24 hours following the procedure.  °For the next 24 hours, DO NOT: °-Drive a car °-Operate  machinery °-Drink alcoholic beverages °-Take any medication unless instructed by your physician °-Make any legal decisions or sign important papers. ° °Meals: °Start with liquid foods such as gelatin or soup. Progress to regular foods as tolerated. Avoid greasy, spicy, heavy foods. If nausea and/or vomiting occur, drink only clear liquids until the nausea and/or vomiting subsides. Call your physician if vomiting continues. ° °Special Instructions/Symptoms: °Your throat may feel dry or sore from the anesthesia or the breathing tube placed in your throat during surgery. If this causes discomfort, gargle with warm salt water. The discomfort should disappear within 24 hours. ° ° °Regional Anesthesia Blocks ° °1. Numbness or the inability to move the "blocked" extremity may last from 3-48 hours after placement. The length of time depends on the medication injected and your individual response to the medication. If the numbness is not going away after 48 hours, call your surgeon. ° °2. The extremity that is blocked will need to be protected until the numbness is gone and the  Strength has returned. Because you cannot feel it, you will need to take extra care to avoid injury. Because it may be weak, you may have difficulty   moving it or using it. You may not know what position it is in without looking at it while the block is in effect.  3. For blocks in the legs and feet, returning to weight bearing and walking needs to be done carefully. You will need to wait until the numbness is entirely gone and the strength has returned. You should be able to move your leg and foot normally before you try and bear weight or walk. You will need someone to be with you when you first try to ensure you do not fall and possibly risk injury.  4. Bruising and tenderness at the needle site are common side effects and will resolve in a few days.  5. Persistent numbness or new problems with movement should be communicated to the surgeon  or the [place] [phone] [place] [phone]).   Call your surgeon if you experience:   1.  Fever over 101.0. 2.  Inability to urinate. 3.  Nausea and/or vomiting. 4.  Extreme swelling or bruising at the surgical site. 5.  Continued bleeding from the incision. 6.  Increased pain, redness or drainage from the incision. 7.  Problems related to your pain medication. 8. Any change in color, movement and/or sensation 9. Any problems and/or concerns

## 2014-02-17 DIAGNOSIS — M1710 Unilateral primary osteoarthritis, unspecified knee: Secondary | ICD-10-CM | POA: Diagnosis not present

## 2014-02-19 ENCOUNTER — Encounter (HOSPITAL_BASED_OUTPATIENT_CLINIC_OR_DEPARTMENT_OTHER): Payer: Self-pay | Admitting: Orthopedic Surgery

## 2016-04-01 ENCOUNTER — Telehealth: Payer: Self-pay | Admitting: Radiology

## 2016-04-01 DIAGNOSIS — Z9225 Personal history of immunosupression therapy: Secondary | ICD-10-CM

## 2016-04-01 NOTE — Telephone Encounter (Signed)
Refill request received via fax for Humira /briova  

## 2016-04-01 NOTE — Telephone Encounter (Signed)
Last visit 7.20.17 Next visit 1.15.18 Labs 02/03/16 Missing TB gold was due in July   Will call patient

## 2016-04-02 ENCOUNTER — Other Ambulatory Visit: Payer: Self-pay | Admitting: Surgery

## 2016-04-03 MED ORDER — ADALIMUMAB 40 MG/0.8ML ~~LOC~~ AJKT: 1.0000 "pen " | AUTO-INJECTOR | SUBCUTANEOUS | 2 refills | Status: DC

## 2016-04-03 NOTE — Telephone Encounter (Signed)
I spoke to patient he states TB gold not drawn, was due in July, he is asking if okay to wait unitl his next set of labs in Dec, or if you need this now. Please advise, if ok to refill Humira, and let him get TB gold with next labs

## 2016-04-03 NOTE — Telephone Encounter (Signed)
Ok to wait till December for labs. Please fill Humira.

## 2016-04-03 NOTE — Telephone Encounter (Signed)
Called patient/ Humira has been sent in lab order has been mailed to him ok to have the TB gold with  Next set of labs

## 2016-04-21 ENCOUNTER — Other Ambulatory Visit (HOSPITAL_COMMUNITY): Payer: Self-pay | Admitting: *Deleted

## 2016-04-21 NOTE — Pre-Procedure Instructions (Signed)
Brandon Frazier  04/21/2016     Your procedure is scheduled on Thursday, April 23, 2016 at 3:30 PM.   Report to [facility] Entrance "A" Admitting Office at 1:30 PM.   Call this number if you have problems the morning of surgery: [phone]   Remember:  Do not eat food or drink liquids after midnight tonight.  Take these medicines the morning of surgery with A SIP OF WATER: Levothyroxine (Synthroid), Omeprazole (Prilosec)   Do not wear jewelry.  Do not wear lotions, powders, or cologne.  Men may shave face and neck.  Do not bring valuables to the hospital.  [facility] is not responsible for any belongings or valuables.  Contacts, dentures or bridgework may not be worn into surgery.  Leave your suitcase in the car.  After surgery it may be brought to your room.  For patients admitted to the hospital, discharge time will be determined by your treatment team.  Patients discharged the day of surgery will not be allowed to drive home.   Special instructions:  [facility] - Preparing for Surgery  Before surgery, you can play an important role.  Because skin is not sterile, your skin needs to be as free of germs as possible.  You can reduce the number of germs on you skin by washing with CHG (chlorahexidine gluconate) soap before surgery.  CHG is an antiseptic cleaner which kills germs and bonds with the skin to continue killing germs even after washing.  Please DO NOT use if you have an allergy to CHG or antibacterial soaps.  If your skin becomes reddened/irritated stop using the CHG and inform your nurse when you arrive at Short Stay.  Do not shave (including legs and underarms) for at least 48 hours prior to the first CHG shower.  You may shave your face.  Please follow these instructions carefully:   1.  Shower with CHG Soap the night before surgery and the                    morning of Surgery.  2.  If you choose to wash your hair, wash your hair first as usual with  your       normal shampoo.  3.  After you shampoo, rinse your hair and body thoroughly to remove the shampoo.  4.  Use CHG as you would any other liquid soap.  You can apply chg directly       to the skin and wash gently with scrungie or a clean washcloth.  5.  Apply the CHG Soap to your body ONLY FROM THE NECK DOWN.        Do not use on open wounds or open sores.  Avoid contact with your eyes, ears, mouth and genitals (private parts).  Wash genitals (private parts) with your normal soap.  6.  Wash thoroughly, paying special attention to the area where your surgery        will be performed.  7.  Thoroughly rinse your body with warm water from the neck down.  8.  DO NOT shower/wash with your normal soap after using and rinsing off       the CHG Soap.  9.  Pat yourself dry with a clean towel.            10.  Wear clean pajamas.            11.  Place clean sheets on your bed the night  of your first shower and do not        sleep with pets.  Day of Surgery  Do not apply any lotions the morning of surgery.  Please wear clean clothes to the hospital.   Please read over the fact sheets that you were given.

## 2016-04-22 ENCOUNTER — Encounter (HOSPITAL_COMMUNITY): Payer: Self-pay

## 2016-04-22 ENCOUNTER — Encounter (HOSPITAL_COMMUNITY)
Admission: RE | Admit: 2016-04-22 | Discharge: 2016-04-22 | Disposition: A | Discharge status: Home / Self Care | Payer: 59 | Source: Ambulatory Visit | Attending: Surgery | Admitting: Surgery

## 2016-04-22 DIAGNOSIS — Z885 Allergy status to narcotic agent status: Secondary | ICD-10-CM | POA: Diagnosis not present

## 2016-04-22 DIAGNOSIS — E785 Hyperlipidemia, unspecified: Secondary | ICD-10-CM | POA: Diagnosis not present

## 2016-04-22 DIAGNOSIS — Z96652 Presence of left artificial knee joint: Secondary | ICD-10-CM | POA: Diagnosis not present

## 2016-04-22 DIAGNOSIS — K420 Umbilical hernia with obstruction, without gangrene: Secondary | ICD-10-CM | POA: Diagnosis not present

## 2016-04-22 DIAGNOSIS — I2510 Atherosclerotic heart disease of native coronary artery without angina pectoris: Secondary | ICD-10-CM | POA: Diagnosis not present

## 2016-04-22 DIAGNOSIS — Z886 Allergy status to analgesic agent status: Secondary | ICD-10-CM | POA: Diagnosis not present

## 2016-04-22 DIAGNOSIS — I252 Old myocardial infarction: Secondary | ICD-10-CM | POA: Diagnosis not present

## 2016-04-22 DIAGNOSIS — K219 Gastro-esophageal reflux disease without esophagitis: Secondary | ICD-10-CM | POA: Diagnosis not present

## 2016-04-22 DIAGNOSIS — I10 Essential (primary) hypertension: Secondary | ICD-10-CM | POA: Diagnosis not present

## 2016-04-22 DIAGNOSIS — D119 Benign neoplasm of major salivary gland, unspecified: Secondary | ICD-10-CM | POA: Diagnosis not present

## 2016-04-22 DIAGNOSIS — L4050 Arthropathic psoriasis, unspecified: Secondary | ICD-10-CM | POA: Diagnosis not present

## 2016-04-22 DIAGNOSIS — Z87891 Personal history of nicotine dependence: Secondary | ICD-10-CM | POA: Diagnosis not present

## 2016-04-22 DIAGNOSIS — M1990 Unspecified osteoarthritis, unspecified site: Secondary | ICD-10-CM | POA: Diagnosis not present

## 2016-04-22 HISTORY — DX: Type 2 diabetes mellitus without complications: E11.9

## 2016-04-22 HISTORY — DX: Arthropathic psoriasis, unspecified: L40.50

## 2016-04-22 LAB — BASIC METABOLIC PANEL
ANION GAP: 8 (ref 5–15)
BUN: 18 mg/dL (ref 6–20)
CO2: 27 mmol/L (ref 22–32)
Calcium: 9.3 mg/dL (ref 8.9–10.3)
Chloride: 102 mmol/L (ref 101–111)
Creatinine, Ser: 0.84 mg/dL (ref 0.61–1.24)
GLUCOSE: 106 mg/dL — AB (ref 65–99)
POTASSIUM: 4 mmol/L (ref 3.5–5.1)
Sodium: 137 mmol/L (ref 135–145)

## 2016-04-22 LAB — CBC
HEMATOCRIT: 44.8 % (ref 39.0–52.0)
HEMOGLOBIN: 14.6 g/dL (ref 13.0–17.0)
MCH: 28.3 pg (ref 26.0–34.0)
MCHC: 32.6 g/dL (ref 30.0–36.0)
MCV: 86.8 fL (ref 78.0–100.0)
Platelets: 257 10*3/uL (ref 150–400)
RBC: 5.16 MIL/uL (ref 4.22–5.81)
RDW: 17.4 % — ABNORMAL HIGH (ref 11.5–15.5)
WBC: 5 10*3/uL (ref 4.0–10.5)

## 2016-04-22 NOTE — H&P (Signed)
Brandon Frazier  Location: [facility] Surgery Patient #: [id number] DOB: [birth year] Married / Language: English / Race: White Male  History of Present Illness   The patient is a 60 year old male who presents with a complaint of umbilical hernia.   The PCP is Dr. Dorris Carnes. Karilyn Cota  The patient was referred by Dr. Dorris Carnes. Gos[place].  He comes by himself.   He has decided to fix his umbilical hernia. He thinks he is active enough and the hernia bothers him enough to getting repaired seems appropriate. He also over the last several weeks developed left groin pain. It is unclear whether has a hernia there or not. He feels no specific incident cuased the pain. He does not see a bulge or mass in his left groin. He does with certain sitting positions or activities have left groin pain. I did not feel a hernia on my exam. I can check his peritoneal cavity at the time of surgery, but would not fix a hernia is seen since I cannot feel one.  History of health and umbilical hernia: The patient has done a good job find to improve his health. At one time he was a smoker. He had a heart attack about 20 years ago. He has stopped smoking, lost weight, and increasing exercise. He noticed an umbilical hernia about 2007. The hernia has not changed much in size. He has no pain on exercise. He has increased his physical exercise to start [employer]. He notices the hernia, but it does not bother him. In trying to lose weight he does not want the hernia to get worse.  Plan: 1) laparoscopic repair of umbilical hernia. He has some specific dates for the hernia. He wants to wait until after Thanksgiving. So he wants to wait until after 11/29. Then he does not want the surgery 12/11. These are payroll days.  I discussed the indications and complications of hernia surgery with the patient. I discussed both the laparoscopic and open approach to hernia repair.. The potential risks  of hernia surgery include, but are not limited to, bleeding, infection, open surgery, nerve injury, and recurrence of the hernia. I provided the patient literature about hernia surgery.  Past Medical History: 1. HTN 2. CAD MI in 1997 3. Hyperlipidemia 4. Left knee arthroplasty - 9/265/2015 - [facility] 5. Psoriatic arthritis - on Humira and methotrexate sees dr. Titus Dubin 6. GERD 7. Left pelvic kidney 8. Last colonoscopy - 2010 - [facility]  Social History: Married. Has two children - 43 and 1 yo Works as a Catering manager for [employer]   Other Problems Kandis Cocking, MD; 04/02/2016 4:59 PM) Arthritis Gastroesophageal Reflux Disease Myocardial infarction Thyroid Disease  Allergies (Sonya Bynum, CMA; 04/02/2016 4:05 PM) Aspirin Low Dose *ANALGESICS - NonNarcotic* Codeine Phosphate *ANALGESICS - OPIOID*  Medication History (Sonya Bynum, CMA; 04/02/2016 4:05 PM) Benazepril-Hydrochlorothiazide (20-25MG  Tablet, Oral) Active. Folic Acid (1MG  Tablet, Oral) Active. Pravastatin Sodium (40MG  Tablet, Oral) Active. Methotrexate Sodium (50MG /2ML Solution, Injection) Active. PriLOSEC OTC (20MG  Tablet DR, Oral) Active. Levothyroxine Sodium ( Tablet, Oral) Active. Testosterone Cypionate (200MG /ML Solution, Intramuscular) Active. PARoxetine HCl (10MG  Tablet, Oral) Active. Humira (40MG /0.8ML Prefill Syr Kit, Subcutaneous) Active. Vitamin D3 (10000UNIT Capsule, Oral) Active. Medications Reconciled  Vitals (Sonya Bynum CMA; 04/02/2016 4:05 PM) 04/02/2016 4:05 PM Weight: 177 lb Height: 68in Body Surface Area: 1.94 m Body Mass Index: 26.91 kg/m  Pulse: 73 (Regular)  BP: 130/76 (Sitting, Left Arm, Standard)   Physical Exam  General: WN tanned WM alert and generally healthy  appearing. Skin: Inspection and palpation of the skin unremarkable.  Eyes: Conjunctivae white, pupils equal. Face, ears, nose, mouth, and throat: Face - normal.  Normal ears and nose. Lips and teeth normal.  Neck: Supple. No mass. Trachea midline. No thyroid mass.  Lymph Nodes: No supraclavicular or cervical adenopathy. No inguinal adenopathy.  Lungs: Normal respiratory effort. Clear to auscultation and symmetric breath sounds. Cardiovascular: Regular rate and rythm. Normal auscultation of the heart. No murmur or rub.  Abdomen: Soft. No mass. Liver and spleen not palpable. Normal bowel sounds. No abdominal scars.  He has a small umbilical hernia - the fascial defect is about 1 cm. The skin is slighly thinned over the umbilicus. In a standing and laying position I examined both groins. I do no feel a hernia or mass.  Musculoskeletal/extremities: Normal gait. Good strength and ROM in upper and lower extremities.   Neurologic: Grossly intact to motor and sensory function.    Assessment & Plan Brandon Hua H. Ezzard Standing MD; 04/02/2016 7:12 PM) 1.  UMBILICAL HERNIA WITHOUT OBSTRUCTION OR GANGRENE (K42.9)  Plan:   1) laparoscopic umbilical hernia repair   Pt Education - Pamphlet Given - Hernia Surgery: discussed with patient and provided information.  2.  LEFT GROIN PAIN (R10.32)  3. HTN 4. CAD MI in 1997 5. Hyperlipidemia 6. Left knee arthroplasty - 9/265/2015 - [facility] 7. Psoriatic arthritis - on Humira and methotrexate sees dr. Titus Dubin 8. GERD 9. Left pelvic kidney   Ovidio Kin, MD, [facility] Surgery Pager: [phone] Office phone:  [phone]

## 2016-04-22 NOTE — Progress Notes (Signed)
Pt has hx of MI in 1997 and had "Repro" given for it. Saw a cardiologist for several years, but no longer has one. States Dr. Anastasio Champion is his PCP. Pt denies any chest pain or sob. Pt very active with Crossfit and running. Pt states he was on Metformin for about 2 years around 2010 to 2012, has lost weight and gotten into shape. States his last A1C was 5.5 in August, 2017 and he does not check his blood sugar at home.

## 2016-04-23 ENCOUNTER — Encounter (HOSPITAL_COMMUNITY)
Admission: RE | Disposition: A | Discharge status: Home / Self Care | Payer: Self-pay | Source: Ambulatory Visit | Attending: Surgery

## 2016-04-23 ENCOUNTER — Ambulatory Visit (HOSPITAL_COMMUNITY)
Admission: RE | Admit: 2016-04-23 | Discharge: 2016-04-23 | Disposition: A | Discharge status: Home / Self Care | Payer: 59 | Source: Ambulatory Visit | Attending: Surgery | Admitting: Surgery

## 2016-04-23 ENCOUNTER — Ambulatory Visit (HOSPITAL_COMMUNITY): Payer: 59 | Admitting: Certified Registered Nurse Anesthetist

## 2016-04-23 ENCOUNTER — Encounter (HOSPITAL_COMMUNITY): Payer: Self-pay | Admitting: *Deleted

## 2016-04-23 DIAGNOSIS — I2510 Atherosclerotic heart disease of native coronary artery without angina pectoris: Secondary | ICD-10-CM | POA: Insufficient documentation

## 2016-04-23 DIAGNOSIS — Z885 Allergy status to narcotic agent status: Secondary | ICD-10-CM | POA: Insufficient documentation

## 2016-04-23 DIAGNOSIS — L4050 Arthropathic psoriasis, unspecified: Secondary | ICD-10-CM | POA: Insufficient documentation

## 2016-04-23 DIAGNOSIS — I10 Essential (primary) hypertension: Secondary | ICD-10-CM | POA: Insufficient documentation

## 2016-04-23 DIAGNOSIS — Z96652 Presence of left artificial knee joint: Secondary | ICD-10-CM | POA: Insufficient documentation

## 2016-04-23 DIAGNOSIS — E785 Hyperlipidemia, unspecified: Secondary | ICD-10-CM | POA: Insufficient documentation

## 2016-04-23 DIAGNOSIS — D119 Benign neoplasm of major salivary gland, unspecified: Secondary | ICD-10-CM | POA: Insufficient documentation

## 2016-04-23 DIAGNOSIS — Z886 Allergy status to analgesic agent status: Secondary | ICD-10-CM | POA: Insufficient documentation

## 2016-04-23 DIAGNOSIS — M1990 Unspecified osteoarthritis, unspecified site: Secondary | ICD-10-CM | POA: Insufficient documentation

## 2016-04-23 DIAGNOSIS — K219 Gastro-esophageal reflux disease without esophagitis: Secondary | ICD-10-CM | POA: Insufficient documentation

## 2016-04-23 DIAGNOSIS — Z87891 Personal history of nicotine dependence: Secondary | ICD-10-CM | POA: Insufficient documentation

## 2016-04-23 DIAGNOSIS — K420 Umbilical hernia with obstruction, without gangrene: Secondary | ICD-10-CM | POA: Insufficient documentation

## 2016-04-23 DIAGNOSIS — I252 Old myocardial infarction: Secondary | ICD-10-CM | POA: Insufficient documentation

## 2016-04-23 HISTORY — PX: INSERTION OF MESH: SHX5868

## 2016-04-23 HISTORY — PX: UMBILICAL HERNIA REPAIR: SHX196

## 2016-04-23 LAB — GLUCOSE, CAPILLARY: Glucose-Capillary: 95 mg/dL (ref 65–99)

## 2016-04-23 SURGERY — REPAIR, HERNIA, UMBILICAL, LAPAROSCOPIC
Anesthesia: General | Site: Abdomen

## 2016-04-23 MED ORDER — GABAPENTIN 300 MG PO CAPS
300.0000 mg | ORAL_CAPSULE | ORAL | Status: AC
  Administered 2016-04-23: 300 mg via ORAL
  Filled 2016-04-23: qty 1

## 2016-04-23 MED ORDER — FENTANYL CITRATE (PF) 100 MCG/2ML IJ SOLN
INTRAMUSCULAR | Status: AC
  Filled 2016-04-23: qty 4

## 2016-04-23 MED ORDER — LIDOCAINE 2% (20 MG/ML) 5 ML SYRINGE
INTRAMUSCULAR | Status: AC
  Filled 2016-04-23: qty 5

## 2016-04-23 MED ORDER — BUPIVACAINE HCL (PF) 0.25 % IJ SOLN
INTRAMUSCULAR | Status: AC
  Filled 2016-04-23: qty 30

## 2016-04-23 MED ORDER — KETOROLAC TROMETHAMINE 30 MG/ML IJ SOLN
INTRAMUSCULAR | Status: AC
  Filled 2016-04-23: qty 1

## 2016-04-23 MED ORDER — ROCURONIUM BROMIDE 10 MG/ML (PF) SYRINGE
PREFILLED_SYRINGE | INTRAVENOUS | Status: AC
  Filled 2016-04-23: qty 10

## 2016-04-23 MED ORDER — CHLORHEXIDINE GLUCONATE CLOTH 2 % EX PADS: 6.0000 | MEDICATED_PAD | Freq: Once | CUTANEOUS | Status: DC

## 2016-04-23 MED ORDER — ROCURONIUM BROMIDE 100 MG/10ML IV SOLN
INTRAVENOUS | Status: DC | PRN
  Administered 2016-04-23: 50 mg via INTRAVENOUS

## 2016-04-23 MED ORDER — LIDOCAINE HCL (CARDIAC) 20 MG/ML IV SOLN
INTRAVENOUS | Status: DC | PRN
  Administered 2016-04-23: 50 mg via INTRAVENOUS

## 2016-04-23 MED ORDER — ACETAMINOPHEN 500 MG PO TABS
1000.0000 mg | ORAL_TABLET | ORAL | Status: AC
  Administered 2016-04-23: 1000 mg via ORAL
  Filled 2016-04-23: qty 2

## 2016-04-23 MED ORDER — PROPOFOL 10 MG/ML IV BOLUS
INTRAVENOUS | Status: DC | PRN
  Administered 2016-04-23: 180 mg via INTRAVENOUS

## 2016-04-23 MED ORDER — ONDANSETRON HCL 4 MG/2ML IJ SOLN: 4.0000 mg | Freq: Once | INTRAMUSCULAR | Status: DC | PRN

## 2016-04-23 MED ORDER — ONDANSETRON HCL 4 MG/2ML IJ SOLN
INTRAMUSCULAR | Status: DC | PRN
  Administered 2016-04-23: 4 mg via INTRAVENOUS

## 2016-04-23 MED ORDER — SODIUM CHLORIDE 0.9 % IR SOLN
Status: DC | PRN
  Administered 2016-04-23: 1000 mL

## 2016-04-23 MED ORDER — ONDANSETRON HCL 4 MG/2ML IJ SOLN
INTRAMUSCULAR | Status: AC
  Filled 2016-04-23: qty 2

## 2016-04-23 MED ORDER — MIDAZOLAM HCL 5 MG/5ML IJ SOLN
INTRAMUSCULAR | Status: DC | PRN
  Administered 2016-04-23: 2 mg via INTRAVENOUS

## 2016-04-23 MED ORDER — KETOROLAC TROMETHAMINE 30 MG/ML IJ SOLN
30.0000 mg | Freq: Once | INTRAMUSCULAR | Status: AC
  Administered 2016-04-23: 30 mg via INTRAVENOUS

## 2016-04-23 MED ORDER — CEFAZOLIN SODIUM-DEXTROSE 2-4 GM/100ML-% IV SOLN
2.0000 g | INTRAVENOUS | Status: AC
  Administered 2016-04-23: 2 g via INTRAVENOUS
  Filled 2016-04-23: qty 100

## 2016-04-23 MED ORDER — HYDROCODONE-ACETAMINOPHEN 5-325 MG PO TABS: 1.0000 | ORAL_TABLET | Freq: Four times a day (QID) | ORAL | 0 refills | Status: DC | PRN

## 2016-04-23 MED ORDER — FENTANYL CITRATE (PF) 100 MCG/2ML IJ SOLN
INTRAMUSCULAR | Status: DC | PRN
  Administered 2016-04-23: 50 ug via INTRAVENOUS
  Administered 2016-04-23: 100 ug via INTRAVENOUS
  Administered 2016-04-23: 50 ug via INTRAVENOUS

## 2016-04-23 MED ORDER — SUGAMMADEX SODIUM 200 MG/2ML IV SOLN
INTRAVENOUS | Status: AC
  Filled 2016-04-23: qty 2

## 2016-04-23 MED ORDER — SUGAMMADEX SODIUM 200 MG/2ML IV SOLN
INTRAVENOUS | Status: DC | PRN
  Administered 2016-04-23: 200 mg via INTRAVENOUS

## 2016-04-23 MED ORDER — FENTANYL CITRATE (PF) 100 MCG/2ML IJ SOLN: 25.0000 ug | INTRAMUSCULAR | Status: DC | PRN

## 2016-04-23 MED ORDER — MIDAZOLAM HCL 2 MG/2ML IJ SOLN
INTRAMUSCULAR | Status: AC
  Filled 2016-04-23: qty 2

## 2016-04-23 MED ORDER — BUPIVACAINE HCL (PF) 0.25 % IJ SOLN
INTRAMUSCULAR | Status: DC | PRN
  Administered 2016-04-23: 30 mL

## 2016-04-23 MED ORDER — LACTATED RINGERS IV SOLN
INTRAVENOUS | Status: DC
  Administered 2016-04-23: 14:00:00 via INTRAVENOUS

## 2016-04-23 SURGICAL SUPPLY — 48 items
APPLIER CLIP ROT 10 11.4 M/L (STAPLE)
BINDER ABDOMINAL 12 ML 46-62 (SOFTGOODS) ×3 IMPLANT
BLADE SURG ROTATE 9660 (MISCELLANEOUS) IMPLANT
CANISTER SUCTION 2500CC (MISCELLANEOUS) IMPLANT
CLIP APPLIE ROT 10 11.4 M/L (STAPLE) IMPLANT
COVER SURGICAL LIGHT HANDLE (MISCELLANEOUS) ×3 IMPLANT
DECANTER SPIKE VIAL GLASS SM (MISCELLANEOUS) ×3 IMPLANT
DERMABOND ADHESIVE PROPEN (GAUZE/BANDAGES/DRESSINGS) ×2
DERMABOND ADVANCED (GAUZE/BANDAGES/DRESSINGS) ×2
DERMABOND ADVANCED .7 DNX12 (GAUZE/BANDAGES/DRESSINGS) ×1 IMPLANT
DERMABOND ADVANCED .7 DNX6 (GAUZE/BANDAGES/DRESSINGS) ×1 IMPLANT
DEVICE SECURE STRAP 25 ABSORB (INSTRUMENTS) ×3 IMPLANT
DEVICE TROCAR PUNCTURE CLOSURE (ENDOMECHANICALS) IMPLANT
DRAPE INCISE IOBAN 66X45 STRL (DRAPES) ×3 IMPLANT
DRAPE LAPAROSCOPIC ABDOMINAL (DRAPES) ×3 IMPLANT
ELECT REM PT RETURN 9FT ADLT (ELECTROSURGICAL) ×3
ELECTRODE REM PT RTRN 9FT ADLT (ELECTROSURGICAL) ×1 IMPLANT
GLOVE BIOGEL PI IND STRL 6.5 (GLOVE) ×1 IMPLANT
GLOVE BIOGEL PI IND STRL 7.0 (GLOVE) ×1 IMPLANT
GLOVE BIOGEL PI INDICATOR 6.5 (GLOVE) ×2
GLOVE BIOGEL PI INDICATOR 7.0 (GLOVE) ×2
GLOVE SURG SIGNA 7.5 PF LTX (GLOVE) ×3 IMPLANT
GLOVE SURG SS PI 6.5 STRL IVOR (GLOVE) ×3 IMPLANT
GOWN STRL REUS W/ TWL LRG LVL3 (GOWN DISPOSABLE) ×2 IMPLANT
GOWN STRL REUS W/ TWL XL LVL3 (GOWN DISPOSABLE) ×1 IMPLANT
GOWN STRL REUS W/TWL LRG LVL3 (GOWN DISPOSABLE) ×4
GOWN STRL REUS W/TWL XL LVL3 (GOWN DISPOSABLE) ×2
KIT BASIN OR (CUSTOM PROCEDURE TRAY) ×3 IMPLANT
KIT ROOM TURNOVER OR (KITS) ×3 IMPLANT
MARKER SKIN DUAL TIP RULER LAB (MISCELLANEOUS) ×3 IMPLANT
MESH PARIETEX 4.7 (Mesh General) ×3 IMPLANT
NEEDLE SPNL 22GX3.5 QUINCKE BK (NEEDLE) ×3 IMPLANT
NS IRRIG 1000ML POUR BTL (IV SOLUTION) ×3 IMPLANT
PAD ABD 8X10 STRL (GAUZE/BANDAGES/DRESSINGS) ×3 IMPLANT
PAD ARMBOARD 7.5X6 YLW CONV (MISCELLANEOUS) ×6 IMPLANT
SCALPEL HARMONIC ACE (MISCELLANEOUS) IMPLANT
SCISSORS LAP 5X35 DISP (ENDOMECHANICALS) ×3 IMPLANT
SET IRRIG TUBING LAPAROSCOPIC (IRRIGATION / IRRIGATOR) ×3 IMPLANT
SPONGE GAUZE 4X4 12PLY STER LF (GAUZE/BANDAGES/DRESSINGS) ×3 IMPLANT
SUT MON AB 5-0 PS2 18 (SUTURE) ×3 IMPLANT
SUT NOVA 0 T19/GS 22DT (SUTURE) ×3 IMPLANT
TOWEL OR 17X24 6PK STRL BLUE (TOWEL DISPOSABLE) ×3 IMPLANT
TOWEL OR 17X26 10 PK STRL BLUE (TOWEL DISPOSABLE) ×3 IMPLANT
TRAY FOLEY CATH 16FRSI W/METER (SET/KITS/TRAYS/PACK) IMPLANT
TRAY LAPAROSCOPIC MC (CUSTOM PROCEDURE TRAY) ×3 IMPLANT
TROCAR XCEL NON-BLD 11X100MML (ENDOMECHANICALS) ×3 IMPLANT
TROCAR XCEL NON-BLD 5MMX100MML (ENDOMECHANICALS) ×6 IMPLANT
TUBING INSUFFLATION (TUBING) ×3 IMPLANT

## 2016-04-23 NOTE — Anesthesia Procedure Notes (Signed)
Procedure Name: Intubation Date/Time: 04/23/2016 4:13 PM Performed by: Candis Shine Pre-anesthesia Checklist: Patient identified, Emergency Drugs available, Suction available and Patient being monitored Patient Re-evaluated:Patient Re-evaluated prior to inductionOxygen Delivery Method: Circle System Utilized Preoxygenation: Pre-oxygenation with 100% oxygen Intubation Type: IV induction Ventilation: Mask ventilation without difficulty Laryngoscope Size: Mac, 3 and 4 Grade View: Grade III Tube type: Oral Tube size: 7.5 mm Number of attempts: 1 Airway Equipment and Method: Stylet and Oral airway Placement Confirmation: ETT inserted through vocal cords under direct vision,  positive ETCO2 and breath sounds checked- equal and bilateral Secured at: 22 cm Tube secured with: Tape Dental Injury: Teeth and Oropharynx as per pre-operative assessment  Difficulty Due To: Difficulty was unanticipated and Difficult Airway- due to anterior larynx Future Recommendations: Recommend- induction with short-acting agent, and alternative techniques readily available Comments: DL x 1 by CRNA with Mac 3, grade III view, unable to pass ETT through cords. DL x 1 by Dr. Willona Phariss Robert with Mac 4, grade III view, 7.5 ETT successfully passed through open vocal cords with cricoid pressure.

## 2016-04-23 NOTE — Anesthesia Preprocedure Evaluation (Signed)
Anesthesia Evaluation  Patient identified by MRN, date of birth, ID band Patient awake    Reviewed: Allergy & Precautions, NPO status , Patient's Chart, lab work & pertinent test results  Airway Mallampati: II  TM Distance: >3 FB Neck ROM: Full    Dental  (+) Teeth Intact, Dental Advisory Given   Pulmonary former smoker,    breath sounds clear to auscultation       Cardiovascular hypertension,  Rhythm:Regular Rate:Normal     Neuro/Psych    GI/Hepatic   Endo/Other  diabetes  Renal/GU      Musculoskeletal   Abdominal   Peds  Hematology   Anesthesia Other Findings   Reproductive/Obstetrics                             Anesthesia Physical Anesthesia Plan  ASA: III  Anesthesia Plan: General   Post-op Pain Management:    Induction: Intravenous  Airway Management Planned: Oral ETT  Additional Equipment:   Intra-op Plan:   Post-operative Plan: Extubation in OR  Informed Consent: I have reviewed the patients History and Physical, chart, labs and discussed the procedure including the risks, benefits and alternatives for the proposed anesthesia with the patient or authorized representative who has indicated his/her understanding and acceptance.   Dental advisory given  Plan Discussed with: CRNA and Anesthesiologist  Anesthesia Plan Comments:         Anesthesia Quick Evaluation  

## 2016-04-23 NOTE — Transfer of Care (Signed)
Immediate Anesthesia Transfer of Care Note  Patient: Brandon Frazier  Procedure(s) Performed: Procedure(s): LAPAROSCOPIC UMBILICAL HERNIA (N/A) INSERTION OF MESH (N/A)  Patient Location: PACU  Anesthesia Type:General  Level of Consciousness: awake, alert  and oriented  Airway & Oxygen Therapy: Patient Spontanous Breathing  Post-op Assessment: Report given to RN, Post -op Vital signs reviewed and stable and Patient moving all extremities X 4  Post vital signs: Reviewed and stable  Last Vitals:  Vitals:   04/23/16 1343  BP: (!) 143/76  Pulse: 65  Resp: 20  Temp: 36.8 C    Last Pain:  Vitals:   04/23/16 1343  TempSrc: Oral      Patients Stated Pain Goal: 1 ([id number])  Complications: No apparent anesthesia complications

## 2016-04-23 NOTE — Interval H&P Note (Signed)
History and Physical Interval Note:  04/23/2016 3:40 PM  Brandon Frazier  has presented today for surgery, with the diagnosis of umbilical hernia  The various methods of treatment have been discussed with the patient and family.  Wife in room.  Will look laparoscopically for inguinal hernias, particularly on the left.  After consideration of risks, benefits and other options for treatment, the patient has consented to  Procedure(s): LAPAROSCOPIC UMBILICAL HERNIA (N/A) INSERTION OF MESH (N/A) as a surgical intervention .  The patient's history has been reviewed, patient examined, no change in status, stable for surgery.  I have reviewed the patient's chart and labs.  Questions were answered to the patient's satisfaction.     Johnnye Sandford H

## 2016-04-23 NOTE — Op Note (Signed)
OPERATIVE NOTE  04/23/2016  5:08 PM  PATIENT:  Brandon Frazier, 60 y.o., male, MRN: [id number]  PREOP DIAGNOSIS:  umbilical hernia  POSTOP DIAGNOSIS:   Incarcerated umbilical hernia, no evidence of inguinal hernias    (Photos at the end of the note)  PROCEDURE:   Procedure(s): LAPAROSCOPIC UMBILICAL HERNIA, INSERTION OF MESH  SURGEON:   Ovidio Kin, M.D.  ASSISTANT:   None  ANESTHESIA:   general  Anesthesiologist: Val Eagle, MD CRNA: Jed Limerick, CRNA; Nils Pyle, CRNA  General  EBL:  minimal  ml  BLOOD ADMINISTERED: none  DRAINS: none   LOCAL MEDICATIONS USED:   30 cc 1/4% marcaine  SPECIMEN:   None  COUNTS CORRECT:  YES  INDICATIONS FOR PROCEDURE:  Brandon Frazier is a 60 y.o. (DOB: [birth year]) white male whose primary care physician is Wilson Singer, MD and comes for repair of umbilical hernia.   The indications and risks of the surgery were explained to the patient.  The risks include, but are not limited to, infection, bleeding, and nerve injury.  OPERATIVE NOTE:  The patient was taken to Operating room 2 at [facility].  He underwent a general anesthesia.  He was given 2 grams of Ancef at the beginning of the operation.   A time out was held and the surgical checklist run.   The abdomen was prepped with Cholroprep and sterilely draped.  I covered the abdomen with a Ioban drape.   I accessed the LUQ with a 5 mm trocar. This trocar was upsized to a 10 mm trocar to insert the mesh.  An additional 5 mm trocar was placed in the left mid abdomen.   I carried out an abdominal exploration.  His liver, stomach, and gall bladder were unremarkable.  He has complained of some left groin pain and wondered whether he had a left inguinal hernia.  But I saw no evidence of a hernia in either groin.  Photos were taken.   He had a 2.5 cm defect at the umbilicus.  There was some omentum incarcerated in the defect.  The hernia was reduced without  difficulty.   Then I placed a 12 cm round Parietex mesh in the abdomen.  It had 4 holding sutures of 0 Novafil.  These were tied down to secure the mesh to the anterior abdominal wall.  I then used 25 Securestrap tacks to tack the mesh to the anterior abdominal wall.   The abdomen was deflated.  The mesh was inspected and there were no defects around the edge.   The trocars were then removed.  There was no bleeding at the trocar sites.  The incisions were closed with 4-0 Monocryl and painted with DermaBond. The puncture sites were painted with DermaBond.   The sponge and needle count were correct.  The patient had an abdominal binder placed.  The patient was transferred to the recovery room in good condition.    Type of repair - mesh  (choices - primary suture, mesh, or component)  Name of mesh - Parietex  Size of mesh - Length 12 cm, Width 12 cm (round)  Mesh overlap - 5 cm  Placement of mesh - beneath fascia and into the peritoneal cavity  (choices - beneath fascia and into peritoneal cavity, beneath fascia but external to peritoneal cavity, between the muscle and fascia, above or external to fascia)   Parietex mesh in place   Left inguinal area - no hernia   Right inguinal area -  though it looks like a hernia - it is not   Omentum stuck in umbilical hernia   Ovidio Kin, MD, [facility] Pager: [phone] Office phone:  [phone]

## 2016-04-23 NOTE — Progress Notes (Signed)
Dr Lucia Gaskins at bedside, feels pt good to DC to home tonight and pt does not need to void prior to DC.  New order for IV Toradol.

## 2016-04-23 NOTE — Discharge Instructions (Signed)
[facility] [place] [facility] - DISCHARGE INSTRUCTIONS TO PATIENT  Activity:  Driving - may drive in 3 to 4 days, if doing well and off the pain meds   Lifting - No lifting more than 15 pounds for one month.  Wound Care:   Leave bandages on for 2 days.  Then you may remove the bandages and shower.        Wear the abdominal binder while out of bed for at least one month.  Diet:  As tolerated.        Drink plenty of water.  Follow up appointment:  You already have an appt in January 2018.  Call Dr. Pollie Friar office [facility]) at [phone] for any problem or question.  Medications and dosages:  Resume your home medications.  You have a prescription for:  Vicodin  Call Dr. Lucia Gaskins or his office  [phone]) if you have:  Temperature greater than 100.4,  Persistent nausea and vomiting,  Severe uncontrolled pain,  Redness, tenderness, or signs of infection (pain, swelling, redness, odor or green/yellow discharge around the site),  Difficulty breathing, headache or visual disturbances,  Any other questions or concerns you may have after discharge.  In an emergency, call 911 or go to an Emergency Department at a nearby hospital.

## 2016-04-23 NOTE — Anesthesia Postprocedure Evaluation (Signed)
Anesthesia Post Note  Patient: Brandon Frazier  Procedure(s) Performed: Procedure(s) (LRB): LAPAROSCOPIC UMBILICAL HERNIA (N/A) INSERTION OF MESH (N/A)  Patient location during evaluation: PACU Anesthesia Type: General Level of consciousness: awake and alert Pain management: pain level controlled Vital Signs Assessment: post-procedure vital signs reviewed and stable Respiratory status: spontaneous breathing, nonlabored ventilation, respiratory function stable and patient connected to nasal cannula oxygen Cardiovascular status: blood pressure returned to baseline and stable Postop Assessment: no signs of nausea or vomiting Anesthetic complications: no    Last Vitals:  Vitals:   04/23/16 1720 04/23/16 1753  BP: 121/78   Pulse: 72 66  Resp: 16 20  Temp: 36.8 C     Last Pain:  Vitals:   04/23/16 1753  TempSrc:   PainSc: [place] Eulogia Dismore

## 2016-04-24 ENCOUNTER — Encounter (HOSPITAL_COMMUNITY): Payer: Self-pay | Admitting: Surgery

## 2016-05-05 ENCOUNTER — Encounter: Payer: Self-pay | Admitting: Rheumatology

## 2016-05-11 ENCOUNTER — Telehealth: Payer: Self-pay | Admitting: Rheumatology

## 2016-05-11 NOTE — Telephone Encounter (Signed)
Patient had labs done on 05/05/16 and had the tb gold done. Patient would like to know if the results had been sent over. Please call patient if you haven't received lab work and he will have it resent.

## 2016-05-11 NOTE — Telephone Encounter (Signed)
Patient advised that we have not received the labs. Patient advised it would probably be best to have them resent. Patient verbalized understanding.

## 2016-05-12 ENCOUNTER — Telehealth: Payer: Self-pay | Admitting: Rheumatology

## 2016-05-12 NOTE — Telephone Encounter (Signed)
Patient states its been about 10 years since he had a pneumonia vaccine; can he have another one?

## 2016-05-13 NOTE — Telephone Encounter (Signed)
Patient advised to discuss with PCP if he needs another pneumonia vaccine. Patient advised if his PCP recommends it is okay for him to have.

## 2016-05-13 NOTE — Telephone Encounter (Signed)
Attempted to contact the patient and left message for patient to call the office.  

## 2016-06-05 DIAGNOSIS — Z79899 Other long term (current) drug therapy: Secondary | ICD-10-CM | POA: Insufficient documentation

## 2016-06-05 DIAGNOSIS — Z96652 Presence of left artificial knee joint: Secondary | ICD-10-CM | POA: Insufficient documentation

## 2016-06-05 DIAGNOSIS — Z8679 Personal history of other diseases of the circulatory system: Secondary | ICD-10-CM | POA: Insufficient documentation

## 2016-06-05 DIAGNOSIS — L4050 Arthropathic psoriasis, unspecified: Secondary | ICD-10-CM | POA: Insufficient documentation

## 2016-06-05 DIAGNOSIS — Z8639 Personal history of other endocrine, nutritional and metabolic disease: Secondary | ICD-10-CM | POA: Insufficient documentation

## 2016-06-05 NOTE — Progress Notes (Signed)
Office Visit Note  Patient: Brandon Frazier             Date of Birth: 1955-08-07           MRN: [id number]             PCP: Wilson Singer, MD Referring:  Visit Date: 06/08/2016 Occupation: @GUAROCC @    Subjective:  Pain hands   History of Present Illness: Brandon Frazier is a 61 y.o. male with history of psoriatic arthritis and psoriasis. According to heavy has been doing well since the last visit. His been very active exercising and has been taking these medications on regular basis. He has some stiffness in his hands. He has morning stiffness lasting for about an hour. He had umbilical hernia repair in November 2017 from which she recovered well. He is currently having a sinus infection. Now he is having some right-sided facial pain and  in his right ear is popping   He reports greenish brown discharge from his nose.  Activities of Daily Living:  Patient reports morning stiffness for1 hour.   Patient Denies nocturnal pain.  Difficulty dressing/grooming: Denies Difficulty climbing stairs: Denies Difficulty getting out of chair: Denies Difficulty using hands for taps, buttons, cutlery, and/or writing: Denies   Review of Systems  Constitutional: Positive for weight loss. Negative for fatigue, night sweats and weakness ( ).       Intentional weight loss  HENT: Negative for mouth sores, mouth dryness and nose dryness.   Eyes: Negative for redness and dryness.  Respiratory: Negative for shortness of breath and difficulty breathing.        Postnasal drip  Cardiovascular: Negative for chest pain, palpitations, hypertension, irregular heartbeat and swelling in legs/feet.  Gastrointestinal: Negative for constipation and diarrhea.  Endocrine: Negative for increased urination.  Musculoskeletal: Positive for arthralgias, joint pain and morning stiffness. Negative for joint swelling, myalgias, muscle weakness, muscle tenderness and myalgias.  Skin: Negative for color change, rash, hair  loss, nodules/bumps, skin tightness, ulcers and sensitivity to sunlight.  Allergic/Immunologic: Negative for susceptible to infections.  Neurological: Negative for dizziness, fainting, memory loss and night sweats.  Hematological: Negative for swollen glands.  Psychiatric/Behavioral: Negative for depressed mood and sleep disturbance. The patient is not nervous/anxious.     PMFS History:  Patient Active Problem List   Diagnosis Date Noted  . Psoriatic arthropathy (HCC) 06/05/2016  . History of hypertension 06/05/2016  . History of diabetes mellitus 06/05/2016  . History of coronary artery disease 06/05/2016  . History of hyperlipidemia 06/05/2016  . High risk medication use 06/05/2016  . Status post left partial knee replacement 06/05/2016  . Primary localized osteoarthrosis, lower leg 02/16/2014  . 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 Psoriasis 12/10/2010  . Knee pain, bilateral 10/30/2010  . Hip pain, bilateral 10/30/2010  . Leg length discrepancy 10/30/2010    Past Medical History:  Diagnosis Date  . Anxiety   . Arthritis    psoriatic arthritis  . Diabetes mellitus without complication (HCC)    no longer on medications, A1C was 5.5 in Aug. 2017. does not check blood sugar at home  . GERD (gastroesophageal reflux disease)   . Hypertension   . Myocardial infarction    1997  . Psoriatic arthritis (HCC)     Family History  Problem Relation Age of Onset  . GER disease Mother   . Heart disease Mother   . Liver cancer Father    Past Surgical History:  Procedure Laterality Date  .  CARDIAC CATHETERIZATION     5621,3086  . COLONOSCOPY    . EYE SURGERY Bilateral    lasik  . INSERTION OF MESH N/A 04/23/2016   Procedure: INSERTION OF MESH;  Surgeon: Ovidio Kin, MD;  [facility]: [facility] OR;  Service: General;  Laterality: N/A;  . left knee arthroscopy     1990, 1997  . PARTIAL KNEE ARTHROPLASTY Left 02/16/2014   Procedure: LEFT KNEE UNI ARTHROPLASTY;  Surgeon: Eulas Post, MD;   Location: [facility];  Service: Orthopedics;  Laterality: Left;  . UMBILICAL HERNIA REPAIR N/A 04/23/2016   Procedure: LAPAROSCOPIC UMBILICAL HERNIA;  Surgeon: Ovidio Kin, MD;  Location: [facility] OR;  Service: General;  Laterality: N/A;   Social History   Social History Narrative  . No narrative on file     Objective: Vital Signs: BP (!) 119/52 (BP Location: Left Arm, Patient Position: Sitting, Cuff Size: Normal)   Pulse (!) 51   Resp 14   Ht 5\' 8"  (1.727 m)   Wt 176 lb (79.8 kg)   BMI 26.76 kg/m    Physical Exam  Constitutional: He is oriented to person, place, and time. He appears well-developed and well-nourished.  HENT:  Head: Normocephalic and atraumatic.  Maxillary sinus tenderness some, postnasal drip. Positive cervical lymphadenopathy.  Eyes: Conjunctivae and EOM are normal. Pupils are equal, round, and reactive to light.  Neck: Normal range of motion. Neck supple.  Cardiovascular: Normal rate, regular rhythm and normal heart sounds.   Pulmonary/Chest: Effort normal and breath sounds normal.  Abdominal: Soft. Bowel sounds are normal.  Neurological: He is alert and oriented to person, place, and time.  Skin: Skin is warm and dry. Capillary refill takes less than 2 seconds.  Psychiatric: He has a normal mood and affect. His behavior is normal.  Nursing note and vitals reviewed.    Musculoskeletal Exam: C-spine and thoracic lumbar spine good range of motion. Shoulder joints elbow joints wrist joint MCPs PIPs DIPs were good range of motion. He has some thickening of PIP/DIP joints bilaterally. Hip joints knee joints ankles MTPs PIPs DIPs with good range of motion. With no synovitis. No SI joint tenderness.  CDAI Exam: CDAI Homunculus Exam:   Joint Counts:  CDAI Tender Joint count: 0 CDAI Swollen Joint count: 0  Global Assessments:  Patient Global Assessment: 1 Provider Global Assessment: 1  CDAI Calculated Score: 2    Investigation: Findings:    12/21/2014 negative TB gold 09/05/2014 X-rays of bilateral feet, 3 views, compared to March 2013 shows PIP and DIP joint space narrowing.  Posterior calcaneal spurring.  No change from 2013.    X-rays of hands, 2 views, shows minimal PIP joint space narrowing.  No erosions.  No change from July 2013 visit.   In May of 2009, SGPT was 42 and repeat on June 24th was within normal limits.  His last labs included CBC, SGOT, SGPT, and creatinine.   03/04/2007 negative hepatitis panel  02/04/2016 CBC normal, CMP normal,   Admission on 04/23/2016, Discharged on 04/23/2016  Component Date Value Ref Range Status  . Glucose-Capillary 04/23/2016 95  65 - 99 mg/dL Final  . Comment 1 57/84/6962 Notify RN   Final  Hospital Outpatient Visit on 04/22/2016  Component Date Value Ref Range Status  . Sodium 04/22/2016 137  135 - 145 mmol/L Final  . Potassium 04/22/2016 4.0  3.5 - 5.1 mmol/L Final  . Chloride 04/22/2016 102  101 - 111 mmol/L Final  . CO2 04/22/2016 27  22 - 32 mmol/L Final  . Glucose, Bld 04/22/2016 106* 65 - 99 mg/dL Final  . BUN 03/47/4259 18  6 - 20 mg/dL Final  . Creatinine, Ser 04/22/2016 0.84  0.61 - 1.24 mg/dL Final  . Calcium 56/38/7564 9.3  8.9 - 10.3 mg/dL Final  . GFR calc non Af Amer 04/22/2016 >60  >60 mL/min Final  . GFR calc Af Amer 04/22/2016 >60  >60 mL/min Final   Comment: (NOTE) The eGFR has been calculated using the CKD EPI equation. This calculation has not been validated in all clinical situations. eGFR's persistently <60 mL/min signify possible Chronic Kidney Disease.   . Anion gap 04/22/2016 8  5 - 15 Final  . WBC 04/22/2016 5.0  4.0 - 10.5 K/uL Final  . RBC 04/22/2016 5.16  4.22 - 5.81 MIL/uL Final  . Hemoglobin 04/22/2016 14.6  13.0 - 17.0 g/dL Final  . HCT 33/29/5188 44.8  39.0 - 52.0 % Final  . MCV 04/22/2016 86.8  78.0 - 100.0 fL Final  . MCH 04/22/2016 28.3  26.0 - 34.0 pg Final  . MCHC 04/22/2016 32.6  30.0 - 36.0 g/dL Final  . RDW 41/66/0630 17.4*  11.5 - 15.5 % Final  . Platelets 04/22/2016 257  150 - 400 K/uL Final     Imaging: No results found.  Speciality Comments: No specialty comments available.    Procedures:  No procedures performed Allergies: Aspirin and Codeine   Assessment / Plan:     Visit Diagnoses: Psoriatic arthropathy (HCC): He has been doing well she has no synovitis on examination today he is tolerating his medications well.  Psoriasis: He has no active lesions.  High risk medication use - he is on Humira every other week 40 mg subcutaneously, methotrexate 1 ML subcutaneously per week, folic acid 2 g by mouth daily. His labs have been normal. I'll give him a standing order Strid check labs in March and then every 3 months to monitor for drug toxicity.  Sinusitis with post nasal drip: He is requesting antibiotics he states his mother-in-law is very sick in the hospital and he won't be able to go to the doctor. Per his request Given him amoxicillin 500 mg by mouth 3 times a day 10 days supply will be given. Side effects were reviewed. I also advised him that he can stop has some Humira if this infection does not get better but he is taking his injection today.  Status post left partial knee replacement: Doing well  His other medical problems are listed as follows:  History of hypertension  History of diabetes mellitus  History of coronary artery disease  History of hyperlipidemia   TB Gold due on December 2018  Orders: Orders Placed This Encounter  Procedures  . CMP14+EGFR  . CBC with Differential/Platelet   Meds ordered this encounter  Medications  . amoxicillin (AMOXIL) 500 MG capsule    Sig: Take 1 capsule (500 mg total) by mouth 3 (three) times daily.    Dispense:  30 capsule    Refill:  0    Face-to-face time spent with patient was 30 minutes. 50% of time was spent in counseling and coordination of care.  Follow-Up Instructions: Return in about 5 months (around 11/06/2016) for Psoriatic  arthritis.   Pollyann Savoy, MD  Note - This record has been created using Animal nutritionist.  Chart creation errors have been sought, but may not always  have been located. Such creation errors do not reflect on  the  standard of medical care.

## 2016-06-08 ENCOUNTER — Ambulatory Visit (INDEPENDENT_AMBULATORY_CARE_PROVIDER_SITE_OTHER): Payer: 59 | Admitting: Rheumatology

## 2016-06-08 ENCOUNTER — Encounter: Payer: Self-pay | Admitting: Rheumatology

## 2016-06-08 VITALS — BP 119/52 | HR 51 | Resp 14 | Ht 68.0 in | Wt 176.0 lb

## 2016-06-08 DIAGNOSIS — Z79899 Other long term (current) drug therapy: Secondary | ICD-10-CM | POA: Diagnosis not present

## 2016-06-08 DIAGNOSIS — Z8679 Personal history of other diseases of the circulatory system: Secondary | ICD-10-CM | POA: Diagnosis not present

## 2016-06-08 DIAGNOSIS — L4050 Arthropathic psoriasis, unspecified: Secondary | ICD-10-CM

## 2016-06-08 DIAGNOSIS — Z96652 Presence of left artificial knee joint: Secondary | ICD-10-CM

## 2016-06-08 DIAGNOSIS — L409 Psoriasis, unspecified: Secondary | ICD-10-CM

## 2016-06-08 DIAGNOSIS — Z8639 Personal history of other endocrine, nutritional and metabolic disease: Secondary | ICD-10-CM | POA: Diagnosis not present

## 2016-06-08 MED ORDER — AMOXICILLIN 500 MG PO CAPS: 500.0000 mg | ORAL_CAPSULE | Freq: Three times a day (TID) | ORAL | 0 refills | Status: DC

## 2016-06-08 NOTE — Progress Notes (Signed)
Rheumatology Medication Review by a Pharmacist Does the patient feel that his/her medications are working for him/her?  Yes Has the patient been experiencing any side effects to the medications prescribed?  No Does the patient have any problems obtaining medications?  No  Issues to address at subsequent visits: None   Pharmacist comments:  Armeen Capulong is a pleasant 61 yo M who presents for follow up of his psoriatic arthritis.  He is currently taking Humira 40 mg every other week, methotrexate 25 mg SQ weekly, and folic acid 2 mg daily.  Patient had most recent standing labs on 05/05/16 at which time CBC was normal and CMP showed BUN of 20, CL of 95, and anion gap of 18.  Patient will be due for standing labs again in March 2018.  He reports he also had his TB Gold done at that same time.  We have not received the results of that test yet.  Patient called [facility] during visit and had them fax the TB Gold results.  TB Gold was negative (05/05/16).  He will be due for TB Gold again in December 2018.  Patient denies any questions or concerns regarding his medications at this time.    Elisabeth Most, Pharm.D., BCPS, CPP Clinical Pharmacist Pager: [phone] Phone: [phone] 06/08/2016 8:38 AM

## 2016-06-08 NOTE — Patient Instructions (Signed)
Standing Labs We placed an order today for your standing lab work.    Please come back and get your standing labs in March 2018 and every 3 months  We have open lab Monday through Friday from 8:30-11:30 AM and 1:30-4 PM at the office of Dr. Tresa Moore, PA.   The office is located at [address] [place], [place], [place] [address] No appointment is necessary.   Labs are drawn by [facility].  You may receive a bill from [facility] for your lab work.

## 2016-06-23 ENCOUNTER — Telehealth: Payer: Self-pay | Admitting: Radiology

## 2016-06-23 MED ORDER — ADALIMUMAB 40 MG/0.8ML ~~LOC~~ AJKT: 1.0000 "pen " | AUTO-INJECTOR | SUBCUTANEOUS | 2 refills | Status: DC

## 2016-06-23 NOTE — Telephone Encounter (Signed)
TB Gold was negative (05/05/16 Last visit 06/08/16 Next visit 11/06/16 CBC CMP 05/05/16 Ok to refill per Dr Estanislado Pandy

## 2016-06-23 NOTE — Telephone Encounter (Signed)
Refill request received via fax for Humira [place]   

## 2016-07-06 ENCOUNTER — Other Ambulatory Visit: Payer: Self-pay | Admitting: Rheumatology

## 2016-07-06 NOTE — Telephone Encounter (Signed)
Last Visit: 06/08/16 Next Visit: 11/06/16 Labs: 05/05/16 WNL  Okay to refill MTX?

## 2016-08-28 ENCOUNTER — Telehealth: Payer: Self-pay | Admitting: Radiology

## 2016-08-28 ENCOUNTER — Other Ambulatory Visit: Payer: Self-pay | Admitting: Radiology

## 2016-08-28 DIAGNOSIS — Z79899 Other long term (current) drug therapy: Secondary | ICD-10-CM

## 2016-08-28 NOTE — Telephone Encounter (Signed)
I have released labs/ [facility] called, they think they are [facility] so this is what was released.

## 2016-09-02 ENCOUNTER — Telehealth: Payer: Self-pay | Admitting: *Deleted

## 2016-09-02 NOTE — Telephone Encounter (Signed)
Lab received from [facility] drawn on 08/28/16  CBC and CMP WNL   Left message to advise patient

## 2016-09-08 ENCOUNTER — Telehealth: Payer: Self-pay | Admitting: Rheumatology

## 2016-09-08 NOTE — Telephone Encounter (Signed)
Ann from [facility] called wanting to get the ICD-10 code for the patient's Humira.  CB#[id number].  Thank you.

## 2016-09-08 NOTE — Telephone Encounter (Signed)
Spoke with [place] and provided ICD 10 code.

## 2016-09-14 ENCOUNTER — Other Ambulatory Visit: Payer: Self-pay | Admitting: Rheumatology

## 2016-09-14 NOTE — Telephone Encounter (Signed)
06/08/16 last visit 11/06/16 next visit  Labs this month WNL, sent for scanning Ok to refill per Dr Estanislado Pandy

## 2016-09-14 NOTE — Telephone Encounter (Signed)
Lab received from [facility] drawn on 08/28/16  CBC and CMP WNL

## 2016-11-06 ENCOUNTER — Ambulatory Visit: Payer: 59 | Admitting: Rheumatology

## 2016-11-10 ENCOUNTER — Other Ambulatory Visit: Payer: Self-pay | Admitting: Rheumatology

## 2016-11-10 NOTE — Telephone Encounter (Signed)
ok 

## 2016-11-10 NOTE — Telephone Encounter (Signed)
06/08/16 last visit 11/06/16 next visit  Labs: 08/28/16 WNL  Okay to refill MTX?

## 2016-11-24 NOTE — Progress Notes (Signed)
Office Visit Note  Patient: Brandon Frazier             Date of Birth: [birth year]           MRN: [id number]             PCP: Wilson Singer, MD Referring: Wilson Singer, MD Visit Date: 11/30/2016 Occupation: @GUAROCC @    Subjective:  Medication Management   History of Present Illness: Brandon Frazier is a 61 y.o. male with history of psoriatic arthritis and psoriasis. He states he's been experiencing some stiffness in his hands and hips today. He's been working out and going to [employer] 5 times a week. He denies any joint swelling. He has occasional stiffness in his left partial knee replacement.  Occasional stiffness in am hip joint is noted.  Activities of Daily Living:  Patient reports morning stiffness for 10 minutes.   Patient Denies nocturnal pain.  Difficulty dressing/grooming: Denies Difficulty climbing stairs: Denies Difficulty getting out of chair: Denies Difficulty using hands for taps, buttons, cutlery, and/or writing: Denies   Review of Systems  Constitutional: Negative for fatigue, night sweats and weakness ( ).  HENT: Negative for mouth sores, mouth dryness and nose dryness.   Eyes: Negative for redness and dryness.  Respiratory: Negative for shortness of breath and difficulty breathing.   Cardiovascular: Negative for chest pain, palpitations, hypertension, irregular heartbeat and swelling in legs/feet.  Gastrointestinal: Negative for constipation and diarrhea.  Endocrine: Negative for increased urination.  Musculoskeletal: Positive for arthralgias, joint pain and morning stiffness. Negative for joint swelling, myalgias, muscle weakness, muscle tenderness and myalgias.  Skin: Negative for color change, rash, hair loss, nodules/bumps, skin tightness, ulcers and sensitivity to sunlight.  Allergic/Immunologic: Negative for susceptible to infections.  Neurological: Negative for dizziness, fainting, memory loss and night sweats.  Hematological: Negative for  swollen glands.  Psychiatric/Behavioral: Negative for depressed mood and sleep disturbance. The patient is not nervous/anxious.     PMFS History:  Patient Active Problem List   Diagnosis Date Noted  . Psoriatic arthropathy (HCC) 06/05/2016  . History of hypertension 06/05/2016  . History of diabetes mellitus 06/05/2016  . History of coronary artery disease 06/05/2016  . History of hyperlipidemia 06/05/2016  . High risk medication use 06/05/2016  . Status post left partial knee replacement 06/05/2016  . Primary localized osteoarthrosis, lower leg 02/16/2014  . 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 Psoriasis 12/10/2010  . Knee pain, bilateral 10/30/2010  . Hip pain, bilateral 10/30/2010  . Leg length discrepancy 10/30/2010    Past Medical History:  Diagnosis Date  . Anxiety   . Arthritis    psoriatic arthritis  . Diabetes mellitus without complication (HCC)    no longer on medications, A1C was 5.5 in Aug. 2017. does not check blood sugar at home  . GERD (gastroesophageal reflux disease)   . Hypertension   . Myocardial infarction (HCC)    1997  . Psoriatic arthritis (HCC)     Family History  Problem Relation Age of Onset  . GER disease Mother   . Heart disease Mother   . Liver cancer Father    Past Surgical History:  Procedure Laterality Date  . CARDIAC CATHETERIZATION     2595,6387  . COLONOSCOPY    . EYE SURGERY Bilateral    lasik  . INSERTION OF MESH N/A 04/23/2016   Procedure: INSERTION OF MESH;  Surgeon: Ovidio Kin, MD;  Location: [facility] OR;  Service: General;  Laterality: N/A;  .  left knee arthroscopy     1990, 1997  . PARTIAL KNEE ARTHROPLASTY Left 02/16/2014   Procedure: LEFT KNEE UNI ARTHROPLASTY;  Surgeon: Eulas Post, MD;  Location: [facility];  Service: Orthopedics;  Laterality: Left;  . UMBILICAL HERNIA REPAIR N/A 04/23/2016   Procedure: LAPAROSCOPIC UMBILICAL HERNIA;  Surgeon: Ovidio Kin, MD;  Location: [facility] OR;  Service: General;  Laterality:  N/A;   Social History   Social History Narrative  . No narrative on file     Objective: Vital Signs: BP 134/64   Pulse 74   Resp 16   Ht 5\' 8"  (1.727 m)   Wt 175 lb (79.4 kg)   BMI 26.61 kg/m    Physical Exam  Constitutional: He is oriented to person, place, and time. He appears well-developed and well-nourished.  HENT:  Head: Normocephalic and atraumatic.  Eyes: Conjunctivae and EOM are normal. Pupils are equal, round, and reactive to light.  Neck: Normal range of motion. Neck supple.  Cardiovascular: Normal rate, regular rhythm and normal heart sounds.   Pulmonary/Chest: Effort normal and breath sounds normal.  Abdominal: Soft. Bowel sounds are normal.  Neurological: He is alert and oriented to person, place, and time.  Skin: Skin is warm and dry. Capillary refill takes less than 2 seconds.  Psychiatric: He has a normal mood and affect. His behavior is normal.  Nursing note and vitals reviewed.    Musculoskeletal Exam: C-spine and thoracic lumbar spine good range of motion. Shoulder joints elbow joints wrist joints MCPs PIPs with good range of motion. He has right first MCP bilateral DIP PIP thickening but no synovitis. Hip joints are good range of motion. He has left partial knee replacement which appears to be doing well without any warmth swelling or effusion. There was no evidence of plantar fasciitis or Achilles tendinitis. He has some DIP PIP thickening of his toes consistent with osteoarthritis.  CDAI Exam: CDAI Homunculus Exam:   Joint Counts:  CDAI Tender Joint count: 0 CDAI Swollen Joint count: 0  Global Assessments:  Patient Global Assessment: 4 Provider Global Assessment: 2  CDAI Calculated Score: 6    Investigation: Findings:  12/12 2017 negative TB gold  11/27/2016 CMP normal, CBC normal  CBC Latest Ref Rng & Units 04/22/2016 02/15/2014  WBC 4.0 - 10.5 K/uL 5.0 5.1  Hemoglobin 13.0 - 17.0 g/dL 60.6 30.1  Hematocrit 60.1 - 52.0 % 44.8 43.3    Platelets 150 - 400 K/uL 257 209   CMP Latest Ref Rng & Units 04/22/2016 02/15/2014  Glucose 65 - 99 mg/dL 093(A) 355(D)  BUN 6 - 20 mg/dL 18 15  Creatinine 3.22 - 1.24 mg/dL 0.25 4.27  Sodium 062 - 145 mmol/L 137 143  Potassium 3.5 - 5.1 mmol/L 4.0 4.7  Chloride 101 - 111 mmol/L 102 102  CO2 22 - 32 mmol/L 27 28  Calcium 8.9 - 10.3 mg/dL 9.3 9.4    Imaging: No results found.  Speciality Comments: No specialty comments available.    Procedures:  No procedures performed Allergies: Aspirin and Codeine   Assessment / Plan:     Visit Diagnoses: Psoriatic arthropathy (HCC): He does not have any active synovitis on examination today. Although he complains of some stiffness.  Psoriasis no active psoriasis lesions were noted.  High risk medication use - Methotrexate 1 mL subcutaneous every week, folic acid 2 mg by mouth daily and Humira 40 mg subcutaneous every other week - Plan: Quantiferon tb gold assay (blood). His labs  were normal in July. We will continue to monitor his labs every 3 months  Hip pain, bilateral: Pain is tolerable currently  Status post left partial knee replacement: Doing fairly well  History of coronary artery disease: Followed up by cardiology. He is on statins  History of hyperlipidemia  History of hypertension: His blood pressure is well controlled  History of diabetes mellitus    Orders: Orders Placed This Encounter  Procedures  . Quantiferon tb gold assay (blood)   Meds ordered this encounter  Medications  . folic acid (FOLVITE) 1 MG tablet    Sig: Take 2 tablets (2 mg total) by mouth daily.    Dispense:  180 tablet    Refill:  3    Face-to-face time spent with patient was 30 minutes. 50% of time was spent in counseling and coordination of care.  Follow-Up Instructions: Return in about 5 months (around 05/02/2017) for Psoriatic arthritis Psorisis.   Pollyann Savoy, MD  Note - This record has been created using Animal nutritionist.   Chart creation errors have been sought, but may not always  have been located. Such creation errors do not reflect on  the standard of medical care.

## 2016-11-26 ENCOUNTER — Ambulatory Visit: Payer: 59 | Admitting: Rheumatology

## 2016-11-30 ENCOUNTER — Encounter: Payer: Self-pay | Admitting: Rheumatology

## 2016-11-30 ENCOUNTER — Ambulatory Visit (INDEPENDENT_AMBULATORY_CARE_PROVIDER_SITE_OTHER): Payer: 59 | Admitting: Rheumatology

## 2016-11-30 VITALS — BP 134/64 | HR 74 | Resp 16 | Ht 68.0 in | Wt 175.0 lb

## 2016-11-30 DIAGNOSIS — Z96652 Presence of left artificial knee joint: Secondary | ICD-10-CM

## 2016-11-30 DIAGNOSIS — M25552 Pain in left hip: Secondary | ICD-10-CM

## 2016-11-30 DIAGNOSIS — Z79899 Other long term (current) drug therapy: Secondary | ICD-10-CM

## 2016-11-30 DIAGNOSIS — M25551 Pain in right hip: Secondary | ICD-10-CM

## 2016-11-30 DIAGNOSIS — L409 Psoriasis, unspecified: Secondary | ICD-10-CM

## 2016-11-30 DIAGNOSIS — L4050 Arthropathic psoriasis, unspecified: Secondary | ICD-10-CM | POA: Diagnosis not present

## 2016-11-30 DIAGNOSIS — Z8679 Personal history of other diseases of the circulatory system: Secondary | ICD-10-CM

## 2016-11-30 DIAGNOSIS — Z8639 Personal history of other endocrine, nutritional and metabolic disease: Secondary | ICD-10-CM

## 2016-11-30 MED ORDER — FOLIC ACID 1 MG PO TABS: 2.0000 mg | ORAL_TABLET | Freq: Every day | ORAL | 3 refills | Status: DC

## 2016-11-30 NOTE — Patient Instructions (Addendum)
Standing Labs We placed an order today for your standing lab work.    Please come back and get your standing labs in October 2018 and every 3 months.  You will be due for your TB Gold in December 2018.    We have open lab Monday through Friday from 8:30-11:30 AM and 1:30-4 PM at the office of Dr. Bo Merino.   The office is located at [address] [place], [place], [place] [address] No appointment is necessary.   Labs are drawn by [facility].  You may receive a bill from [facility] for your lab work. If you have any questions regarding directions or hours of operation,  please call [phone].    Natural anti-inflammatories  You can purchase these at [employer], [employer] or online.  . Turmeric (capsules)  . Ginger (ginger root or capsules)  . Omega 3 (Fish, flax seeds, chia seeds, walnuts, almonds)  . Tart cherry (dried or extract)   Patient should be under the care of a physician while taking these supplements. This may not be reproduced without the permission of Dr. Bo Merino.

## 2016-11-30 NOTE — Progress Notes (Signed)
Rheumatology Medication Review by a Pharmacist Does the patient feel that his/her medications are working for him/her?  Yes Has the patient been experiencing any side effects to the medications prescribed?  No Does the patient have any problems obtaining medications?  No  Issues to address at subsequent visits: None   Pharmacist comments:  Brandon Frazier is a pleasant 61 yo M who presents for follow up of his psoriatic arthritis.  He is currently taking Humira 40 mg every other week, methotrexate 25 mg SQ weekly, and folic acid 2 mg daily.  Patient had standing labs on 11/27/16 which were normal.  Patient will be due for standing labs again in October 2018.  Most recent TB Gold was negative on 05/05/16.  He will be due for TB Gold again in December 2018.  Patient denies any questions or concerns regarding his medications at this time.   Elisabeth Most, Pharm.D., BCPS, CPP Clinical Pharmacist Pager: [phone] Phone: [phone] 11/30/2016 9:48 AM

## 2017-01-22 ENCOUNTER — Telehealth: Payer: Self-pay | Admitting: Rheumatology

## 2017-01-22 NOTE — Telephone Encounter (Signed)
Patient has had a recent insurance change, and will need a rx for Humira called into a new pharmacy. Pharmacy [facility] Fax# [phone] Please call patient if any problems or concerns.

## 2017-01-27 ENCOUNTER — Other Ambulatory Visit: Payer: Self-pay | Admitting: *Deleted

## 2017-01-27 MED ORDER — ADALIMUMAB 40 MG/0.8ML ~~LOC~~ AJKT: 1.0000 "pen " | AUTO-INJECTOR | SUBCUTANEOUS | 0 refills | Status: DC

## 2017-01-27 NOTE — Telephone Encounter (Signed)
Refill request received via fax for Humira  Last Visit: 11/30/16 Next Visit: 05/03/17 Labs: 11/27/16 WNL TB Gold: 1212/17 Neg  Okay to refill per Dr. Estanislado Pandy

## 2017-01-29 ENCOUNTER — Telehealth: Payer: Self-pay | Admitting: Rheumatology

## 2017-01-29 NOTE — Telephone Encounter (Signed)
Called patient to verify new insurance information. The last card information uploaded to pt profile is from 06/08/16. Will need new information before I can submit a prior auth.   Left message for pt to call back.  Giovanni Biby, [place], CPhT 2:20 PM

## 2017-01-29 NOTE — Telephone Encounter (Signed)
[facility] needs prior authorization for Humira 40mg  injection. Patient has had a change in insurance. New insurance in system.

## 2017-02-01 NOTE — Telephone Encounter (Addendum)
Was able to find the patient's insurance information through the RX30 search option.   BIN: [id number] ID: [id number] GP: [id number]  A prior authorization for HUMIRA was submitted to patients insurance via cover my meds. Will update once we receive a response.   Kashia Brossard, [place], CPhT 10:02 AM

## 2017-02-01 NOTE — Telephone Encounter (Signed)
Received a fax from [facility] regarding a prior authorization approval for [place] from 02/01/2017 to 02/02/2019.   Reference number:[id number] Phone number:[phone]  Will send document to scan center.  Called patient to update him. Lett message on voicemail.  Lyllie Cobbins, [place], CPhT 11:12 AM

## 2017-02-02 NOTE — Telephone Encounter (Signed)
Okay to give sample per Dr. Estanislado Pandy

## 2017-02-02 NOTE — Telephone Encounter (Signed)
Patient called to say that his pharmacy is having processing his Humira Rx. They are getting are getting a rejection that a PA is needed. Verified the PA information with him. The authorizaiton was approved through [facility]. He now has a BCBS plan that started 12/23/16.  BIN: [id number] PCN: ADV ID: [id number] GP: [id number]  Will submit a PA to pt new plan. He is all out of meds. Can we give him a sample? His is able to come in on 02/03/17.   Thanks!  Heydy Montilla, [place], CPhT 4:38 PM

## 2017-02-03 ENCOUNTER — Telehealth: Payer: Self-pay

## 2017-02-03 MED ORDER — ADALIMUMAB 40 MG/0.8ML ~~LOC~~ AJKT
1.0000 | AUTO-INJECTOR | SUBCUTANEOUS | 0 refills | Status: DC
Start: 2017-02-03 — End: 2018-03-08

## 2017-02-03 NOTE — Telephone Encounter (Signed)
A prior authorization for HUMIRA was submitted to patients new insurance via cover my meds.  BIN: [id number] PCN: ADV ID: [id number] GP: [id number]   Received a favorable outcome from cover my meds. No additional information was given. Will update once we receive a response.   Azam Gervasi, [place], CPhT 11:27 AM

## 2017-02-03 NOTE — Telephone Encounter (Signed)
Received a fax from [place] regarding a prior authorization approval for Humira from 02/03/2017 to 02/03/2019.   Reference number:[id number]  Will send document to scan center.  Called patient to update him. Left message on voicemail.  Monroe Toure, [place], CPhT 1:47 PM

## 2017-02-03 NOTE — Telephone Encounter (Signed)
Signed out a sample to him it is in refrigerator. Brandon Frazier has advised patient to pick up

## 2017-02-03 NOTE — Addendum Note (Signed)
Addended byCandice Camp on: 02/03/2017 10:50 AM   Modules accepted: Orders

## 2017-02-07 ENCOUNTER — Other Ambulatory Visit: Payer: Self-pay | Admitting: Rheumatology

## 2017-02-08 ENCOUNTER — Telehealth: Payer: Self-pay

## 2017-02-08 MED ORDER — METHOTREXATE SODIUM CHEMO INJECTION 50 MG/2ML: 25.0000 mg | INTRAMUSCULAR | 0 refills | Status: DC

## 2017-02-08 NOTE — Telephone Encounter (Signed)
Patient returned call. He states that the pharmacy did not have any record on the pt profile. Patient is out of refills of MTX and would like to [facility].   Can you send new rx for pt? Thanks!  Jamaia Brum, [place], CPhT 1:57 PM

## 2017-02-08 NOTE — Addendum Note (Signed)
Addended by: Carole Binning on: 02/08/2017 02:22 PM   Modules accepted: Orders

## 2017-02-08 NOTE — Telephone Encounter (Signed)
Last Visit: 11/30/16 Next Visit: 05/03/17 Labs: 11/27/16 WNL  Okay to refill per Dr. Estanislado Pandy

## 2017-02-08 NOTE — Telephone Encounter (Signed)
Patient called with an update stating that he should received his HUMIRA from the specialty pharmacy within the next few days. He also states that he requested a refill for [facility] at [facility]. They told him that it was denied. After reviewing his profile, I did not find a refill request from the pharmacy. Have the pharmacy send the request again, it could have gone to the wrong clinic. Patient voiced understanding and denied any questions at this time.   Brandon Frazier, [place], CPhT 1:49 PM

## 2017-03-10 ENCOUNTER — Telehealth: Payer: Self-pay

## 2017-03-10 DIAGNOSIS — Z79899 Other long term (current) drug therapy: Secondary | ICD-10-CM

## 2017-03-10 NOTE — Telephone Encounter (Addendum)
Patient called to say that he is due for labs. The labs that was previously put in is no longer available. He would like to have have his labs done tomorrow evening. He gets his labs done at a [facility] facility:   [facility]  Fax: [phone] Phone: [phone]  Will you send in labs for him? Thanks!  Tykerria Mccubbins, [place], CPhT 3:41 PM

## 2017-03-11 NOTE — Telephone Encounter (Signed)
Lab orders faxed.

## 2017-03-19 ENCOUNTER — Ambulatory Visit (INDEPENDENT_AMBULATORY_CARE_PROVIDER_SITE_OTHER): Payer: BC Managed Care – PPO | Admitting: *Deleted

## 2017-03-19 DIAGNOSIS — Z23 Encounter for immunization: Secondary | ICD-10-CM | POA: Diagnosis not present

## 2017-04-20 NOTE — Progress Notes (Deleted)
Office Visit Note  Patient: Brandon Frazier             Date of Birth: 1956/01/16           MRN: [id number]             PCP: Wilson Singer, MD Referring: Wilson Singer, MD Visit Date: 05/03/2017 Occupation: @GUAROCC @    Subjective:  No chief complaint on file.   History of Present Illness: Brandon Frazier is a 61 y.o. male ***   Activities of Daily Living:  Patient reports morning stiffness for *** {minute/hour:19697}.   Patient {ACTIONS;DENIES/REPORTS:21021675::"Denies"} nocturnal pain.  Difficulty dressing/grooming: {ACTIONS;DENIES/REPORTS:21021675::"Denies"} Difficulty climbing stairs: {ACTIONS;DENIES/REPORTS:21021675::"Denies"} Difficulty getting out of chair: {ACTIONS;DENIES/REPORTS:21021675::"Denies"} Difficulty using hands for taps, buttons, cutlery, and/or writing: {ACTIONS;DENIES/REPORTS:21021675::"Denies"}   No Rheumatology ROS completed.   PMFS History:  Patient Active Problem List   Diagnosis Date Noted  . Psoriatic arthropathy (HCC) 06/05/2016  . History of hypertension 06/05/2016  . History of diabetes mellitus 06/05/2016  . History of coronary artery disease 06/05/2016  . History of hyperlipidemia 06/05/2016  . High risk medication use 06/05/2016  . Status post left partial knee replacement 06/05/2016  . Primary localized osteoarthrosis, lower leg 02/16/2014  . 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 Psoriasis 12/10/2010  . Knee pain, bilateral 10/30/2010  . Hip pain, bilateral 10/30/2010  . Leg length discrepancy 10/30/2010    Past Medical History:  Diagnosis Date  . Anxiety   . Arthritis    psoriatic arthritis  . Diabetes mellitus without complication (HCC)    no longer on medications, A1C was 5.5 in Aug. 2017. does not check blood sugar at home  . GERD (gastroesophageal reflux disease)   . Hypertension   . Myocardial infarction (HCC)    1997  . Psoriatic arthritis (HCC)     Family History  Problem Relation Age of Onset  . GER disease Mother   .  Heart disease Mother   . Liver cancer Father    Past Surgical History:  Procedure Laterality Date  . CARDIAC CATHETERIZATION     [id number]  . COLONOSCOPY    . EYE SURGERY Bilateral    lasik  . INSERTION OF MESH N/A 04/23/2016   Procedure: INSERTION OF MESH;  Surgeon: Ovidio Kin, MD;  Location: [facility] OR;  Service: General;  Laterality: N/A;  . left knee arthroscopy     1990, 1997  . PARTIAL KNEE ARTHROPLASTY Left 02/16/2014   Procedure: LEFT KNEE UNI ARTHROPLASTY;  Surgeon: Eulas Post, MD;  Location: [facility];  Service: Orthopedics;  Laterality: Left;  . UMBILICAL HERNIA REPAIR N/A 04/23/2016   Procedure: LAPAROSCOPIC UMBILICAL HERNIA;  Surgeon: Ovidio Kin, MD;  Location: [facility] OR;  Service: General;  Laterality: N/A;   Social History   Social History Narrative  . Not on file     Objective: Vital Signs: There were no vitals taken for this visit.   Physical Exam   Musculoskeletal Exam: ***  CDAI Exam: No CDAI exam completed.    Investigation: No additional findings. CBC CMP: 03/11/2017 elevated glucose, otherwise WNL  TB Gold: 05/05/2016 Negative  Imaging: No results found.  Speciality Comments: No specialty comments available.    Procedures:  No procedures performed Allergies: Aspirin and Codeine   Assessment / Plan:     Visit Diagnoses: No diagnosis found.    Orders: No orders of the defined types were placed in this encounter.  No orders of the defined types were placed in  this encounter.   Face-to-face time spent with patient was *** minutes. 50% of time was spent in counseling and coordination of care.  Follow-Up Instructions: No Follow-up on file.   Ellen Henri, CMA  Note - This record has been created using Animal nutritionist.  Chart creation errors have been sought, but may not always  have been located. Such creation errors do not reflect on  the standard of medical care.

## 2017-04-28 ENCOUNTER — Other Ambulatory Visit: Payer: Self-pay | Admitting: *Deleted

## 2017-04-28 MED ORDER — METHOTREXATE SODIUM CHEMO INJECTION 50 MG/2ML: 25.0000 mg | INTRAMUSCULAR | 0 refills | Status: DC

## 2017-04-28 NOTE — Telephone Encounter (Signed)
Refill request received via fax  Last Visit: 11/30/16 Next Visit: 05/03/17 Labs 03/11/17 Elevated Glucose   Okay to refill per Dr. Estanislado Pandy

## 2017-05-03 ENCOUNTER — Ambulatory Visit: Payer: 59 | Admitting: Rheumatology
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
## 2017-05-04 ENCOUNTER — Other Ambulatory Visit: Payer: Self-pay | Admitting: Rheumatology

## 2017-05-04 ENCOUNTER — Ambulatory Visit: Payer: BC Managed Care – PPO | Admitting: Rheumatology

## 2017-05-04 ENCOUNTER — Encounter: Payer: Self-pay | Admitting: Rheumatology

## 2017-05-04 VITALS — BP 125/50 | HR 70 | Resp 18 | Ht 68.0 in | Wt 184.0 lb

## 2017-05-04 DIAGNOSIS — Z8679 Personal history of other diseases of the circulatory system: Secondary | ICD-10-CM

## 2017-05-04 DIAGNOSIS — L4050 Arthropathic psoriasis, unspecified: Secondary | ICD-10-CM | POA: Diagnosis not present

## 2017-05-04 DIAGNOSIS — J069 Acute upper respiratory infection, unspecified: Secondary | ICD-10-CM

## 2017-05-04 DIAGNOSIS — L409 Psoriasis, unspecified: Secondary | ICD-10-CM | POA: Diagnosis not present

## 2017-05-04 DIAGNOSIS — Z96652 Presence of left artificial knee joint: Secondary | ICD-10-CM | POA: Diagnosis not present

## 2017-05-04 DIAGNOSIS — Z79899 Other long term (current) drug therapy: Secondary | ICD-10-CM | POA: Diagnosis not present

## 2017-05-04 DIAGNOSIS — Z8639 Personal history of other endocrine, nutritional and metabolic disease: Secondary | ICD-10-CM | POA: Diagnosis not present

## 2017-05-04 MED ORDER — CLOBETASOL PROPIONATE 0.05 % EX CREA: TOPICAL_CREAM | Freq: Two times a day (BID) | CUTANEOUS | Status: DC

## 2017-05-04 NOTE — Patient Instructions (Addendum)
Standing Labs We placed an order today for your standing lab work.    Please come back and get your standing labs in January and every 3 months TB Gold is due in January  We have open lab Monday through Friday from 8:30-11:30 AM and 1:30-4 PM at the office of Dr. Bo Merino.   The office is located at [address] [place], [place], [place] [address] No appointment is necessary.   Labs are drawn by [facility].  You may receive a bill from [facility] for your lab work. If you have any questions regarding directions or hours of operation,  please call [phone].     Please schedule appointment with a  Dermatologist.

## 2017-05-05 ENCOUNTER — Telehealth: Payer: Self-pay | Admitting: Rheumatology

## 2017-05-05 NOTE — Telephone Encounter (Signed)
[facility] [place] [facility]me doctor wanted him to try. Please send in.

## 2017-05-05 NOTE — Telephone Encounter (Signed)
Last Visit: 05/04/17 Next Visit: 10/01/17 Labs: 03/11/17 Elevated glucose TB Gold: 05/05/16 Neg   Okay to refill per Dr. Estanislado Pandy

## 2017-05-06 MED ORDER — CLOBETASOL PROPIONATE 0.05 % EX CREA
1.0000 | TOPICAL_CREAM | Freq: Two times a day (BID) | CUTANEOUS | 2 refills | Status: DC
Start: 2017-05-06 — End: 2018-01-11

## 2017-05-06 NOTE — Telephone Encounter (Signed)
Prescription sent to the pharmacy as it was originally put in for a clinic administered medication.

## 2017-05-07 ENCOUNTER — Telehealth: Payer: Self-pay

## 2017-05-07 ENCOUNTER — Telehealth: Payer: Self-pay | Admitting: Rheumatology

## 2017-05-07 MED ORDER — AZITHROMYCIN 250 MG PO TABS: ORAL_TABLET | ORAL | 0 refills | Status: DC

## 2017-05-07 NOTE — Telephone Encounter (Signed)
Please call in a prescription for Z-Pak. During his last visit we had discussion regarding congestion and cough. Advised him to hold Humira until infection clears up.

## 2017-05-07 NOTE — Telephone Encounter (Signed)
Patient states he was seen in the office on 05/04/17. Patient states he was offered a prescription for ATB and declined at that time. Patient states he is not feeling any better and cough is getting worse and getting into his chest. Patient's PCP in not in the office today and would like to know if you will be willing to send in the ATB.

## 2017-05-07 NOTE — Telephone Encounter (Signed)
Opened in error

## 2017-05-07 NOTE — Addendum Note (Signed)
Addended by: Carole Binning on: 05/07/2017 05:00 PM   Modules accepted: Orders

## 2017-05-07 NOTE — Telephone Encounter (Signed)
Patient left a message stating he was checking on rx. Please call to advise.

## 2017-05-07 NOTE — Telephone Encounter (Signed)
Patient called stating that he is not feeling any better and  has been coughing more.  [facility] [place]  CB# is [phone].  Please advise.  Thank you.

## 2017-05-07 NOTE — Telephone Encounter (Signed)
Prescription sent to the pharmacy. And patient aware.

## 2017-06-02 ENCOUNTER — Other Ambulatory Visit: Payer: Self-pay | Admitting: *Deleted

## 2017-06-02 ENCOUNTER — Other Ambulatory Visit: Payer: Self-pay

## 2017-06-02 DIAGNOSIS — Z79899 Other long term (current) drug therapy: Secondary | ICD-10-CM

## 2017-06-02 DIAGNOSIS — Z9225 Personal history of immunosupression therapy: Secondary | ICD-10-CM

## 2017-06-03 LAB — CBC WITH DIFFERENTIAL/PLATELET
BASOS ABS: 49 {cells}/uL (ref 0–200)
Basophils Relative: 0.8 %
EOS ABS: 140 {cells}/uL (ref 15–500)
Eosinophils Relative: 2.3 %
HCT: 45.3 % (ref 38.5–50.0)
HEMOGLOBIN: 15.2 g/dL (ref 13.2–17.1)
Lymphs Abs: 2532 cells/uL (ref 850–3900)
MCH: 29.1 pg (ref 27.0–33.0)
MCHC: 33.6 g/dL (ref 32.0–36.0)
MCV: 86.8 fL (ref 80.0–100.0)
MONOS PCT: 12.6 %
MPV: 9.9 fL (ref 7.5–12.5)
NEUTROS ABS: 2611 {cells}/uL (ref 1500–7800)
Neutrophils Relative %: 42.8 %
Platelets: 256 10*3/uL (ref 140–400)
RBC: 5.22 10*6/uL (ref 4.20–5.80)
RDW: 16.1 % — ABNORMAL HIGH (ref 11.0–15.0)
Total Lymphocyte: 41.5 %
WBC mixed population: 769 cells/uL (ref 200–950)
WBC: 6.1 10*3/uL (ref 3.8–10.8)

## 2017-06-03 LAB — COMPLETE METABOLIC PANEL WITH GFR
AG RATIO: 1.6 (calc) (ref 1.0–2.5)
ALBUMIN MSPROF: 4.4 g/dL (ref 3.6–5.1)
ALT: 49 U/L — AB (ref 9–46)
AST: 34 U/L (ref 10–35)
Alkaline phosphatase (APISO): 46 U/L (ref 40–115)
BUN: 25 mg/dL (ref 7–25)
CALCIUM: 9.7 mg/dL (ref 8.6–10.3)
CO2: 29 mmol/L (ref 20–32)
Chloride: 100 mmol/L (ref 98–110)
Creat: 0.77 mg/dL (ref 0.70–1.25)
GFR, EST AFRICAN AMERICAN: 112 mL/min/{1.73_m2} (ref >=60)
GFR, EST NON AFRICAN AMERICAN: 97 mL/min/{1.73_m2} (ref >=60)
Globulin: 2.7 g/dL (calc) (ref 1.9–3.7)
Glucose, Bld: 115 mg/dL — ABNORMAL HIGH (ref 65–99)
POTASSIUM: 4.4 mmol/L (ref 3.5–5.3)
Sodium: 137 mmol/L (ref 135–146)
Total Bilirubin: 1.2 mg/dL (ref 0.2–1.2)
Total Protein: 7.1 g/dL (ref 6.1–8.1)

## 2017-06-03 LAB — TIQ-MISC

## 2017-06-03 LAB — QUANTIFERON-TB GOLD PLUS

## 2017-06-03 NOTE — Progress Notes (Signed)
Avoid NSAIDs and alcohol

## 2017-06-21 ENCOUNTER — Other Ambulatory Visit: Payer: Self-pay

## 2017-06-21 ENCOUNTER — Other Ambulatory Visit: Payer: Self-pay | Admitting: *Deleted

## 2017-06-21 DIAGNOSIS — Z79899 Other long term (current) drug therapy: Secondary | ICD-10-CM

## 2017-06-21 DIAGNOSIS — Z9225 Personal history of immunosupression therapy: Secondary | ICD-10-CM

## 2017-06-23 ENCOUNTER — Telehealth: Payer: Self-pay | Admitting: Rheumatology

## 2017-06-23 LAB — QUANTIFERON-TB GOLD PLUS
Mitogen-NIL: >10 IU/mL
NIL: 0.08 IU/mL
QUANTIFERON-TB GOLD PLUS: NEGATIVE
TB1-NIL: <0 IU/mL

## 2017-06-23 NOTE — Telephone Encounter (Signed)
Patient states he has a cold for the third time since December 2018. Patient states he woke up yesterday morning with a head cold. Patient advised to hold until feeling better.

## 2017-06-23 NOTE — Telephone Encounter (Signed)
Patient called stating that he had questions regarding his medications: Methotrexate and Humira.

## 2017-08-06 ENCOUNTER — Other Ambulatory Visit: Payer: Self-pay | Admitting: Rheumatology

## 2017-08-06 NOTE — Telephone Encounter (Signed)
Last Visit: 05/04/17 Next Visit: 10/01/17 Labs: 06/02/17  ALT 49 TB Gold: 06/02/17 Neg   Okay to refill per Dr. Estanislado Pandy

## 2017-08-31 ENCOUNTER — Other Ambulatory Visit: Payer: Self-pay | Admitting: Rheumatology

## 2017-08-31 NOTE — Telephone Encounter (Signed)
Last Visit: 05/04/17 Next Visit: 10/01/17 Labs: 06/02/17  ALT 49  Okay to refill per Dr. Estanislado Pandy

## 2017-09-17 NOTE — Progress Notes (Signed)
Office Visit Note  Patient: Brandon Frazier             Date of Birth: 1955/06/22           MRN: [id number]             PCP: Wilson Singer, MD Referring: Wilson Singer, MD Visit Date: 10/01/2017 Occupation: @GUAROCC @    Subjective:  Pain in right knee.   History of Present Illness: Brandon Frazier is a 62 y.o. male with history of psoriasis and psoriatic arthritis.  He states about 6 weeks ago he injured his right knee while working out at [employer]  He has been seeing a chiropractor and having massage and TENS unit treatment.  He states the symptoms improved after resting his right knee.  Once the symptoms improved he started working out again and had recurrence of swelling in his knee joint.  He states he had nocturnal pain last night.  None of the other joints or swelling.  He has no recurrence of psoriasis.  The left partial knee replacement is doing well.  Activities of Daily Living:  Patient reports morning stiffness for 5 minute.   Patient Reports nocturnal pain.  Difficulty dressing/grooming: Denies Difficulty climbing stairs: Reports Difficulty getting out of chair: Denies Difficulty using hands for taps, buttons, cutlery, and/or writing: Denies   Review of Systems  Constitutional: Negative for fatigue and night sweats.  HENT: Negative for mouth sores, mouth dryness and nose dryness.   Eyes: Negative for redness and dryness.  Respiratory: Negative for shortness of breath and difficulty breathing.   Cardiovascular: Negative for chest pain, palpitations, hypertension, irregular heartbeat and swelling in legs/feet.  Gastrointestinal: Negative for constipation and diarrhea.  Endocrine: Negative for increased urination.  Musculoskeletal: Positive for arthralgias, joint pain, joint swelling and morning stiffness. Negative for myalgias, muscle weakness, muscle tenderness and myalgias.  Skin: Negative for color change, rash, hair loss, nodules/bumps, skin tightness, ulcers  and sensitivity to sunlight.  Allergic/Immunologic: Negative for susceptible to infections.  Neurological: Negative for dizziness, fainting, memory loss, night sweats and weakness ( ).  Hematological: Negative for swollen glands.  Psychiatric/Behavioral: Positive for sleep disturbance. Negative for depressed mood. The patient is not nervous/anxious.     PMFS History:  Patient Active Problem List   Diagnosis Date Noted  . Psoriatic arthropathy (HCC) 06/05/2016  . History of hypertension 06/05/2016  . History of diabetes mellitus 06/05/2016  . History of coronary artery disease 06/05/2016  . History of hyperlipidemia 06/05/2016  . High risk medication use 06/05/2016  . Status post left partial knee replacement 06/05/2016  . Primary localized osteoarthrosis, lower leg 02/16/2014  . 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 Psoriasis 12/10/2010  . Knee pain, bilateral 10/30/2010  . Hip pain, bilateral 10/30/2010  . Leg length discrepancy 10/30/2010    Past Medical History:  Diagnosis Date  . Anxiety   . Arthritis    psoriatic arthritis  . Diabetes mellitus without complication (HCC)    no longer on medications, A1C was 5.5 in Aug. 2017. does not check blood sugar at home  . GERD (gastroesophageal reflux disease)   . Hypertension   . Myocardial infarction (HCC)    1997  . Psoriatic arthritis (HCC)     Family History  Problem Relation Age of Onset  . GER disease Mother   . Heart disease Mother   . Liver cancer Father    Past Surgical History:  Procedure Laterality Date  . CARDIAC CATHETERIZATION  8119,1478  . COLONOSCOPY    . EYE SURGERY Bilateral    lasik  . INSERTION OF MESH N/A 04/23/2016   Procedure: INSERTION OF MESH;  Surgeon: Ovidio Kin, MD;  Location: [facility] OR;  Service: General;  Laterality: N/A;  . KNEE ARTHROPLASTY    . left knee arthroscopy     1990, 1997  . PARTIAL KNEE ARTHROPLASTY Left 02/16/2014   Procedure: LEFT KNEE UNI ARTHROPLASTY;  Surgeon: Eulas Post,  MD;  Location: [facility];  Service: Orthopedics;  Laterality: Left;  . UMBILICAL HERNIA REPAIR N/A 04/23/2016   Procedure: LAPAROSCOPIC UMBILICAL HERNIA;  Surgeon: Ovidio Kin, MD;  Location: [facility] OR;  Service: General;  Laterality: N/A;   Social History   Social History Narrative  . Not on file     Objective: Vital Signs: BP 133/76 (BP Location: Left Arm, Patient Position: Sitting, Cuff Size: Normal)   Pulse 65   Resp 15   Ht 5\' 8"  (1.727 m)   Wt 190 lb (86.2 kg)   BMI 28.89 kg/m    Physical Exam  Constitutional: He is oriented to person, place, and time. He appears well-developed and well-nourished.  HENT:  Head: Normocephalic and atraumatic.  Eyes: Pupils are equal, round, and reactive to light. Conjunctivae and EOM are normal.  Neck: Normal range of motion. Neck supple.  Cardiovascular: Normal rate, regular rhythm and normal heart sounds.  Pulmonary/Chest: Effort normal and breath sounds normal.  Abdominal: Soft. Bowel sounds are normal.  Neurological: He is alert and oriented to person, place, and time.  Skin: Skin is warm and dry. Capillary refill takes less than 2 seconds.  Psychiatric: He has a normal mood and affect. His behavior is normal.  Nursing note and vitals reviewed.    Musculoskeletal Exam: C-spine some limitation with range of motion.  Thoracic and lumbar spine good range of motion.  Shoulder joints elbow joints wrist joints are good range of motion.  He has DIP PIP thickening in his hands consistent with osteoarthritis.  He has good range of motion of his knee joints.  Left knee is partially replaced which is doing well.  He has warmth and swelling in his right knee joint without any effusion.  Ankle joints MTPs PIPs were in good range of motion.   CDAI Exam: CDAI Homunculus Exam:   Tenderness:  RLE: tibiofemoral  Swelling:  RLE: tibiofemoral  Joint Counts:  CDAI Tender Joint count: 1 CDAI Swollen Joint count: 1  Global  Assessments:  Patient Global Assessment: 1 Provider Global Assessment: 1  CDAI Calculated Score: 4    Investigation: No additional findings.TB Gold: 06/21/2017 Negative  CBC Latest Ref Rng & Units 09/28/2017 06/02/2017 04/22/2016  WBC 3.8 - 10.8 Thousand/uL 5.0 6.1 5.0  Hemoglobin 13.2 - 17.1 g/dL 29.5 62.1 30.8  Hematocrit 38.5 - 50.0 % 47.2 45.3 44.8  Platelets 140 - 400 Thousand/uL 274 256 257   CMP Latest Ref Rng & Units 09/28/2017 06/02/2017 04/22/2016  Glucose 65 - 99 mg/dL 95 657(Q) 469(G)  BUN 7 - 25 mg/dL 18 25 18   Creatinine 0.70 - 1.25 mg/dL 2.95 2.84 1.32  Sodium 135 - 146 mmol/L 136 137 137  Potassium 3.5 - 5.3 mmol/L 4.1 4.4 4.0  Chloride 98 - 110 mmol/L 98 100 102  CO2 20 - 32 mmol/L 30 29 27   Calcium 8.6 - 10.3 mg/dL 9.6 9.7 9.3  Total Protein 6.1 - 8.1 g/dL 7.2 7.1 -  Total Bilirubin 0.2 - 1.2 mg/dL 1.0 1.2 -  AST 10 - 35 U/L 34 34 -  ALT 9 - 46 U/L 47(H) 49(H) -    Imaging: Xr Knee 3 View Right  Result Date: 10/01/2017 Moderate medial compartment narrowing.  Moderate patellofemoral narrowing.  No chondrocalcinosis. Impression: Moderate osteoarthritis and moderate chondromalacia patella.   Speciality Comments: No specialty comments available.    Procedures:  Large Joint Inj on 10/01/2017 9:04 AM Indications: pain Details: 27 G 1.5 in needle, medial approach  Arthrogram: No  Medications: 1.5 mL lidocaine (PF) 1 %; 60 mg triamcinolone acetonide 40 MG/ML Aspirate: 0 mL Outcome: tolerated well, no immediate complications Procedure, treatment alternatives, risks and benefits explained, specific risks discussed. Consent was given by the patient. Immediately prior to procedure a time out was called to verify the correct patient, procedure, equipment, support staff and site/side marked as required. Patient was prepped and draped in the usual sterile fashion.     Allergies: Aspirin and Codeine   Assessment / Plan:     Visit Diagnoses: Psoriatic arthropathy  (HCC)-she has no active synovitis.  He is doing quite well on combination therapy.  Psoriasis-he has no active lesions of psoriasis.  High risk medication use - Methotrexate 1 mL subcutaneous every week, folic acid 2 mg by mouth daily and Humira 40 mg subcutaneous every other week .  His labs have been stable we will continue to monitor every 3 months.  Acute pain of right knee -he had a knee injury while working out at the gym about 6 weeks ago.  He has been having persistent swelling and discomfort.  Plan: XR KNEE 3 VIEW RIGHT.  The x-ray showed moderate osteoarthritis and moderate chondromalacia patella.  As he is having ongoing discomfort and nocturnal pain different treatment options were discussed in the right knee joint was injected with cortisone as described above.  He tolerated the procedure well.  If he has ongoing discomfort he will call us back and we may consider MRI of the knee joint to rule out any internal derangement.  Status post left partial knee replacement-doing well.  History of coronary artery disease - Followed up by cardiology. He is on statins.  History of hypertension-blood pressure is well controlled.  History of diabetes mellitus-his blood sugars have been within normal limits. History of hyperlipidemia -he has been trying weight loss and exercise.   Orders: Orders Placed This Encounter  Procedures  . XR KNEE 3 VIEW RIGHT   Meds ordered this encounter  Medications  . diclofenac sodium (VOLTAREN) 1 % GEL    Sig: Apply 2 g topically 4 (four) times daily.    Dispense:  5 Tube    Refill:  0    Face-to-face time spent with patient was 30 minutes. >50% of time was spent in counseling and coordination of care.  Follow-Up Instructions: Return in about 5 months (around 03/03/2018) for Psoriatic arthritis.   Pollyann Savoy, MD  Note - This record has been created using Animal nutritionist.  Chart creation errors have been sought, but may not always  have been  located. Such creation errors do not reflect on  the standard of medical care.

## 2017-09-28 ENCOUNTER — Other Ambulatory Visit: Payer: Self-pay

## 2017-09-28 DIAGNOSIS — Z79899 Other long term (current) drug therapy: Secondary | ICD-10-CM

## 2017-09-29 LAB — CBC WITH DIFFERENTIAL/PLATELET
BASOS ABS: 40 {cells}/uL (ref 0–200)
BASOS PCT: 0.8 %
Eosinophils Absolute: 50 cells/uL (ref 15–500)
Eosinophils Relative: 1 %
HEMATOCRIT: 47.2 % (ref 38.5–50.0)
HEMOGLOBIN: 16 g/dL (ref 13.2–17.1)
LYMPHS ABS: 2440 {cells}/uL (ref 850–3900)
MCH: 29.6 pg (ref 27.0–33.0)
MCHC: 33.9 g/dL (ref 32.0–36.0)
MCV: 87.2 fL (ref 80.0–100.0)
MPV: 9.8 fL (ref 7.5–12.5)
Monocytes Relative: 11 %
NEUTROS ABS: 1920 {cells}/uL (ref 1500–7800)
Neutrophils Relative %: 38.4 %
Platelets: 274 10*3/uL (ref 140–400)
RBC: 5.41 10*6/uL (ref 4.20–5.80)
RDW: 15.6 % — ABNORMAL HIGH (ref 11.0–15.0)
TOTAL LYMPHOCYTE: 48.8 %
WBC mixed population: 550 cells/uL (ref 200–950)
WBC: 5 10*3/uL (ref 3.8–10.8)

## 2017-09-29 LAB — COMPLETE METABOLIC PANEL WITH GFR
AG RATIO: 1.6 (calc) (ref 1.0–2.5)
ALT: 47 U/L — ABNORMAL HIGH (ref 9–46)
AST: 34 U/L (ref 10–35)
Albumin: 4.4 g/dL (ref 3.6–5.1)
Alkaline phosphatase (APISO): 43 U/L (ref 40–115)
BUN: 18 mg/dL (ref 7–25)
CO2: 30 mmol/L (ref 20–32)
Calcium: 9.6 mg/dL (ref 8.6–10.3)
Chloride: 98 mmol/L (ref 98–110)
Creat: 0.81 mg/dL (ref 0.70–1.25)
GFR, Est African American: 110 mL/min/{1.73_m2} (ref >=60)
GFR, Est Non African American: 95 mL/min/{1.73_m2} (ref >=60)
Globulin: 2.8 g/dL (calc) (ref 1.9–3.7)
Glucose, Bld: 95 mg/dL (ref 65–99)
Potassium: 4.1 mmol/L (ref 3.5–5.3)
SODIUM: 136 mmol/L (ref 135–146)
TOTAL PROTEIN: 7.2 g/dL (ref 6.1–8.1)
Total Bilirubin: 1 mg/dL (ref 0.2–1.2)

## 2017-10-01 ENCOUNTER — Ambulatory Visit (INDEPENDENT_AMBULATORY_CARE_PROVIDER_SITE_OTHER): Payer: Self-pay

## 2017-10-01 ENCOUNTER — Encounter: Payer: Self-pay | Admitting: Rheumatology

## 2017-10-01 ENCOUNTER — Ambulatory Visit: Payer: BC Managed Care – PPO | Admitting: Rheumatology

## 2017-10-01 ENCOUNTER — Telehealth: Payer: Self-pay

## 2017-10-01 VITALS — BP 133/76 | HR 65 | Resp 15 | Ht 68.0 in | Wt 190.0 lb

## 2017-10-01 DIAGNOSIS — L4050 Arthropathic psoriasis, unspecified: Secondary | ICD-10-CM | POA: Diagnosis not present

## 2017-10-01 DIAGNOSIS — Z79899 Other long term (current) drug therapy: Secondary | ICD-10-CM | POA: Diagnosis not present

## 2017-10-01 DIAGNOSIS — Z8679 Personal history of other diseases of the circulatory system: Secondary | ICD-10-CM

## 2017-10-01 DIAGNOSIS — Z96652 Presence of left artificial knee joint: Secondary | ICD-10-CM | POA: Diagnosis not present

## 2017-10-01 DIAGNOSIS — L409 Psoriasis, unspecified: Secondary | ICD-10-CM

## 2017-10-01 DIAGNOSIS — M25561 Pain in right knee: Secondary | ICD-10-CM | POA: Diagnosis not present

## 2017-10-01 DIAGNOSIS — Z8639 Personal history of other endocrine, nutritional and metabolic disease: Secondary | ICD-10-CM

## 2017-10-01 MED ORDER — DICLOFENAC SODIUM 1 % TD GEL: 2.0000 g | Freq: Four times a day (QID) | TRANSDERMAL | 0 refills | Status: DC

## 2017-10-01 MED ORDER — LIDOCAINE HCL (PF) 1 % IJ SOLN
1.5000 mL | INTRAMUSCULAR | Status: AC | PRN
  Administered 2017-10-01: 1.5 mL

## 2017-10-01 MED ORDER — TRIAMCINOLONE ACETONIDE 40 MG/ML IJ SUSP
60.0000 mg | INTRAMUSCULAR | Status: AC | PRN
  Administered 2017-10-01: 60 mg via INTRA_ARTICULAR

## 2017-10-01 NOTE — Telephone Encounter (Signed)
Received a prior authorization for Diclofenac gel from [facility]. Authorization was submitted to pts insruance via cover my meds. Will update once we have a response.   Tanny Harnack 10:35 AM

## 2017-10-01 NOTE — Patient Instructions (Addendum)
Standing Labs We placed an order today for your standing lab work.    Please come back and get your standing labs in August and every 3 months   We have open lab Monday through Friday from 8:30-11:30 AM and 1:30-4:00 PM  at the office of Dr. Dushawn Pusey.   You may experience shorter wait times on Monday and Friday afternoons. The office is located at [address], [address], [place],  [address] No appointment is necessary.   Labs are drawn by [facility].  You may receive a bill from [facility] for your lab work. If you have any questions regarding directions or hours of operation,  please call [phone].    

## 2017-10-04 NOTE — Telephone Encounter (Signed)
Received a fax from [facility] regarding a prior authorization DENIAL for DICLOFENAC GEL. Medication is only approved when pt has a diagnosis of osteoarthritis pain in joints such as feet, ankle, knees, hands, wrists or elbows.     Reference number: [id number] Phone number:[phone]  Will send document to scan center.  Called pt to update. Left voicemail  Tyrese Capriotti, [place], CPhT 9:52 AM

## 2017-11-12 ENCOUNTER — Other Ambulatory Visit: Payer: Self-pay | Admitting: Rheumatology

## 2017-11-12 NOTE — Telephone Encounter (Signed)
Last Visit: 10/01/17 Next Visit: 03/09/18 Labs: 09/28/17 stable Tb Gold: 06/21/17 Neg   Okay to refill per Dr. Estanislado Pandy

## 2017-11-19 ENCOUNTER — Other Ambulatory Visit: Payer: Self-pay | Admitting: Rheumatology

## 2017-11-19 NOTE — Telephone Encounter (Signed)
Last Visit: 10/01/17 Next Visit: 03/09/18 Labs: 09/28/17 stable  Okay to refill per Dr. Estanislado Pandy

## 2018-01-11 ENCOUNTER — Ambulatory Visit: Payer: BC Managed Care – PPO | Admitting: Podiatry

## 2018-01-11 ENCOUNTER — Encounter: Payer: Self-pay | Admitting: Podiatry

## 2018-01-11 ENCOUNTER — Other Ambulatory Visit: Payer: Self-pay

## 2018-01-11 DIAGNOSIS — B351 Tinea unguium: Secondary | ICD-10-CM

## 2018-01-11 DIAGNOSIS — B353 Tinea pedis: Secondary | ICD-10-CM | POA: Diagnosis not present

## 2018-01-11 DIAGNOSIS — Z79899 Other long term (current) drug therapy: Secondary | ICD-10-CM

## 2018-01-11 MED ORDER — KETOCONAZOLE 2 % EX CREA: 1.0000 "application " | TOPICAL_CREAM | Freq: Every day | CUTANEOUS | 0 refills | Status: DC

## 2018-01-11 NOTE — Patient Instructions (Signed)
I took a culture of your toenails today. If you do not hear from me in 2 weeks about the results, give me a call  If was nice to meet you today. If you have any questions or any further concerns, please feel fee to give me a call. You can call our office at [phone] or please feel fee to send me a message through [place].

## 2018-01-11 NOTE — Progress Notes (Signed)
Subjective:    Patient ID: EVEN POLIO, male    DOB: 02-25-1956, 62 y.o.   MRN: [id number]  HPI  62 year old male presents the office today for toenail concerns.  States that nails are thickened and somewhat discolored.  There is no significant pain to the nails and denies any redness or drainage or any swelling.  He also states that his feet also appeal and had a white chalky appearance to them at times.  Said no treatment for either these issues.  He has no other concerns today.  Review of Systems  All other systems reviewed and are negative.  Past Medical History:  Diagnosis Date  . Anxiety   . Arthritis    psoriatic arthritis  . Diabetes mellitus without complication (HCC)    no longer on medications, A1C was 5.5 in Aug. 2017. does not check blood sugar at home  . GERD (gastroesophageal reflux disease)   . Hypertension   . Myocardial infarction (HCC)    1997  . Psoriatic arthritis [facility])     Past Surgical History:  Procedure Laterality Date  . CARDIAC CATHETERIZATION     [id number]  . COLONOSCOPY    . EYE SURGERY Bilateral    lasik  . INSERTION OF MESH N/A 04/23/2016   Procedure: INSERTION OF MESH;  Surgeon: Ovidio Kin, MD;  Location: [facility] OR;  Service: General;  Laterality: N/A;  . KNEE ARTHROPLASTY    . left knee arthroscopy     1990, 1997  . PARTIAL KNEE ARTHROPLASTY Left 02/16/2014   Procedure: LEFT KNEE UNI ARTHROPLASTY;  Surgeon: Eulas Post, MD;  Location: [facility];  Service: Orthopedics;  Laterality: Left;  . UMBILICAL HERNIA REPAIR N/A 04/23/2016   Procedure: LAPAROSCOPIC UMBILICAL HERNIA;  Surgeon: Ovidio Kin, MD;  Location: [facility] OR;  Service: General;  Laterality: N/A;     Current Outpatient Medications:  .  Adalimumab (HUMIRA PEN) 40 MG/0.8ML PNKT, Inject 1 pen into the skin every 14 (fourteen) days. (Patient not taking: Reported on 10/01/2017), Disp: 1 each, Rfl: 0 .  ARMOUR THYROID 60 MG tablet, Take 60 mg by mouth daily., Disp: ,  Rfl: 1 .  benazepril-hydrochlorthiazide (LOTENSIN HCT) 20-25 MG per tablet, Take 1 tablet by mouth daily.  , Disp: , Rfl:  .  folic acid (FOLVITE) 1 MG tablet, Take 2 tablets (2 mg total) by mouth daily., Disp: 180 tablet, Rfl: 3 .  ketoconazole (NIZORAL) 2 % cream, Apply 1 application topically daily., Disp: 60 g, Rfl: 0 .  methotrexate 50 MG/2ML injection, INJECT 1 ML (25 MG TOTAL) INTO THE SKIN ONCE A WEEK., Disp: 12 mL, Rfl: 0 .  omeprazole (PRILOSEC) 20 MG capsule, Take by mouth., Disp: , Rfl:  .  PARoxetine (PAXIL) 10 MG tablet, Take 10 mg by mouth at bedtime. , Disp: , Rfl:  .  pravastatin (PRAVACHOL) 40 MG tablet, Take 40 mg by mouth at bedtime. , Disp: , Rfl:  .  testosterone cypionate (DEPOTESTOSTERONE CYPIONATE) 200 MG/ML injection, Inject 50 mg into the muscle 2 (two) times a week. , Disp: , Rfl: 1  Allergies  Allergen Reactions  . Aspirin Other (See Comments)    Burns stomach (GERD). Denies development of stomach ulcer while on aspirin.   . Codeine Other (See Comments)    Caused me to be hyper        Objective:   Physical Exam  General: AAO x3, NAD  Dermatological: The toenails appear to be hypertrophic, dystrophic ill-defined discoloration  most of the bilateral hallux toenails.  There is no pain in the nails there is no swelling redness or drainage and some of infection.  On the plantar aspect of bilateral feet there is some dry skin present also some mild evidence of tinea pedis with the arch of the foot is dry, erythematous skin.  There is no open lesions identified.  Vascular: Dorsalis Pedis artery and Posterior Tibial artery pedal pulses are 2/4 bilateral with immedate capillary fill time.  There is no pain with calf compression, swelling, warmth, erythema.   Neruologic: Grossly intact via light touch bilateral. Protective threshold with Semmes Wienstein monofilament intact to all pedal sites bilateral.   Musculoskeletal: No gross boney pedal deformities bilateral. No  pain, crepitus, or limitation noted with foot and ankle range of motion bilateral. Muscular strength 5/5 in all groups tested bilateral.  Gait: Unassisted, Nonantalgic.     Assessment & Plan:  Onychodystrophy likely onychomycosis with tinea pedis -Treatment options discussed including all alternatives, risks, and complications -Etiology of symptoms were discussed -Today discussed treatments for nail fungus.  However prior to starting treatment I did debride the nails today I sent this for culture, pathology to [facility] labs.  I will call him once I get the results back. -Prescribed ketoconazole cream for tinea pedis  Vivi Barrack DPM

## 2018-01-12 LAB — COMPLETE METABOLIC PANEL WITH GFR
AG RATIO: 1.5 (calc) (ref 1.0–2.5)
ALBUMIN MSPROF: 4.3 g/dL (ref 3.6–5.1)
ALT: 52 U/L — ABNORMAL HIGH (ref 9–46)
AST: 41 U/L — ABNORMAL HIGH (ref 10–35)
Alkaline phosphatase (APISO): 42 U/L (ref 40–115)
BILIRUBIN TOTAL: 0.9 mg/dL (ref 0.2–1.2)
BUN: 14 mg/dL (ref 7–25)
CO2: 27 mmol/L (ref 20–32)
CREATININE: 0.87 mg/dL (ref 0.70–1.25)
Calcium: 9.4 mg/dL (ref 8.6–10.3)
Chloride: 98 mmol/L (ref 98–110)
GFR, EST NON AFRICAN AMERICAN: 91 mL/min/{1.73_m2} (ref >=60)
GFR, Est African American: 106 mL/min/{1.73_m2} (ref >=60)
GLOBULIN: 2.9 g/dL (ref 1.9–3.7)
Glucose, Bld: 113 mg/dL — ABNORMAL HIGH (ref 65–99)
Potassium: 4.4 mmol/L (ref 3.5–5.3)
Sodium: 135 mmol/L (ref 135–146)
Total Protein: 7.2 g/dL (ref 6.1–8.1)

## 2018-01-12 LAB — CBC WITH DIFFERENTIAL/PLATELET
Basophils Absolute: 42 cells/uL (ref 0–200)
Basophils Relative: 0.9 %
EOS ABS: 99 {cells}/uL (ref 15–500)
Eosinophils Relative: 2.1 %
HCT: 47.8 % (ref 38.5–50.0)
HEMOGLOBIN: 15.5 g/dL (ref 13.2–17.1)
LYMPHS ABS: 2124 {cells}/uL (ref 850–3900)
MCH: 28.5 pg (ref 27.0–33.0)
MCHC: 32.4 g/dL (ref 32.0–36.0)
MCV: 88 fL (ref 80.0–100.0)
MPV: 9.9 fL (ref 7.5–12.5)
Monocytes Relative: 15.2 %
NEUTROS ABS: 1720 {cells}/uL (ref 1500–7800)
Neutrophils Relative %: 36.6 %
Platelets: 251 10*3/uL (ref 140–400)
RBC: 5.43 10*6/uL (ref 4.20–5.80)
RDW: 16.3 % — ABNORMAL HIGH (ref 11.0–15.0)
Total Lymphocyte: 45.2 %
WBC: 4.7 10*3/uL (ref 3.8–10.8)
WBCMIX: 714 {cells}/uL (ref 200–950)

## 2018-01-21 ENCOUNTER — Telehealth: Payer: Self-pay | Admitting: Rheumatology

## 2018-01-21 NOTE — Telephone Encounter (Signed)
Labs drawn on 01/11/18 show elevated AST 41, ALT 52. Patient is one MTX 1 mL weekly and Humira every 14 days. Reviewed by Dr. Estanislado Pandy and patient to decrease MTX 0.8 mL weekly.

## 2018-01-21 NOTE — Telephone Encounter (Signed)
Patient advised of lab results and recommendations. Patient verbalized understanding.  

## 2018-01-21 NOTE — Telephone Encounter (Signed)
Patient called stating he came into our office last week for his labwork and is requesting a return call with the results.

## 2018-01-27 NOTE — Progress Notes (Signed)
LFTs are elevated.  Please notify patient to decrease methotrexate to 0.8 mL subcu weekly.  He was clinically doing well.

## 2018-01-28 ENCOUNTER — Telehealth: Payer: Self-pay | Admitting: Rheumatology

## 2018-01-28 NOTE — Telephone Encounter (Signed)
Patient called stating he was returning your call.  Patient requested that you call back on a temporary # [phone]

## 2018-01-28 NOTE — Telephone Encounter (Signed)
Patient advised of lab results and recommendations. Patient verbalized understanding.  

## 2018-02-08 ENCOUNTER — Other Ambulatory Visit: Payer: Self-pay | Admitting: Rheumatology

## 2018-02-08 NOTE — Telephone Encounter (Signed)
Last Visit: 10/01/17 Next Visit: 03/09/18  Okay to refill per Dr. Estanislado Pandy

## 2018-02-09 ENCOUNTER — Telehealth: Payer: Self-pay | Admitting: Podiatry

## 2018-02-09 ENCOUNTER — Other Ambulatory Visit: Payer: Self-pay | Admitting: Rheumatology

## 2018-02-09 MED ORDER — NONFORMULARY OR COMPOUNDED ITEM: 3 refills | Status: DC

## 2018-02-09 NOTE — Telephone Encounter (Signed)
It does show fungus. Given his other medications (methotrexate) I want to put him on Lamisil but not the best for the liver. Lets do a topical from [facility], the one with the acid to help thin the toenail.

## 2018-02-09 NOTE — Telephone Encounter (Signed)
Last Visit: 10/01/17 Next Visit: 03/09/18 Labs: 02/01/18 Creat 1.35 previously 0.87 GFR 55 Previously 91  TB Gold: 06/21/17 Neg   Okay to refill Humira?

## 2018-02-09 NOTE — Telephone Encounter (Signed)
ok 

## 2018-02-09 NOTE — Telephone Encounter (Signed)
Pt called about his culture results was told to call if he had not heard anything in 3 weeks.

## 2018-02-09 NOTE — Telephone Encounter (Signed)
I informed pt the results had arrived and I had message Dr. Jacqualyn Posey and he would contact me with orders.

## 2018-02-09 NOTE — Telephone Encounter (Signed)
I informed pt of Dr. Leigh Aurora review of fungal results and orders and [place] [phone] would call with the insurance coverage and delivery information. Pt states understanding. Faxed orders to [employer].

## 2018-02-12 ENCOUNTER — Other Ambulatory Visit: Payer: Self-pay | Admitting: Rheumatology

## 2018-02-14 NOTE — Telephone Encounter (Signed)
Last Visit: 10/01/17 Next Visit: 03/09/18 Labs: 02/01/18 Creat 1.35 previously 0.87 GFR 55 Previously 91   Okay to refill per Dr.Deveshwar

## 2018-02-23 NOTE — Progress Notes (Signed)
Office Visit Note  Patient: Brandon Frazier             Date of Birth: [birth year]           MRN: [id number]             PCP: Ignatius Specking, MD Referring: Wilson Singer, MD Visit Date: 03/09/2018 Occupation: @GUAROCC @  Subjective:  Pain in both hands   History of Present Illness: Brandon Frazier is a 62 y.o. male with history of psoriatic arthritis.  Patient has resumed Humira 40 mg sq injections every 2 weeks, MTX 0.8 ml once weekly, and folic acid 2 mg po daily on Saturday.  He missed 1 dose of Humira and 3 doses of methotrexate.  He was being treated for bronchitis and had 2 rounds of antibiotics as well as a IM steroid injection.  He continues to have an occasional dry cough but denies any fevers recently.  He is feeling considerably better.  He states he is having some stiffness and achiness in bilateral hands but denies any joint swelling.  He denies any psoriasis at this time.  He denies any plantar fasciitis or Achilles tendinitis.  He has mild SI joint pain bilaterally.  He states he continues to work out on a daily basis.    Activities of Daily Living:  Patient reports morning stiffness for 20  minutes.   Patient Denies nocturnal pain.  Difficulty dressing/grooming: Denies Difficulty climbing stairs: Denies Difficulty getting out of chair: Denies Difficulty using hands for taps, buttons, cutlery, and/or writing: Denies  Review of Systems  Constitutional: Negative for fatigue and night sweats.  HENT: Negative for mouth sores, mouth dryness and nose dryness.   Eyes: Negative for redness and dryness.  Respiratory: Positive for cough. Negative for hemoptysis, shortness of breath and difficulty breathing.   Cardiovascular: Negative for chest pain, palpitations, hypertension, irregular heartbeat and swelling in legs/feet.  Gastrointestinal: Negative for blood in stool, constipation and diarrhea.  Endocrine: Negative for increased urination.  Genitourinary: Negative for painful  urination.  Musculoskeletal: Positive for morning stiffness. Negative for arthralgias, joint pain, joint swelling, myalgias, muscle weakness, muscle tenderness and myalgias.  Skin: Negative for color change, rash, hair loss, nodules/bumps, skin tightness, ulcers and sensitivity to sunlight.  Allergic/Immunologic: Negative for susceptible to infections.  Neurological: Negative for dizziness, fainting, memory loss, night sweats and weakness.  Hematological: Negative for swollen glands.  Psychiatric/Behavioral: Negative for depressed mood and sleep disturbance. The patient is not nervous/anxious.     PMFS History:  Patient Active Problem List   Diagnosis Date Noted  . Psoriatic arthropathy (HCC) 06/05/2016  . History of hypertension 06/05/2016  . History of diabetes mellitus 06/05/2016  . History of coronary artery disease 06/05/2016  . History of hyperlipidemia 06/05/2016  . High risk medication use 06/05/2016  . Status post left partial knee replacement 06/05/2016  . Primary localized osteoarthrosis, lower leg 02/16/2014  . 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 Psoriasis 12/10/2010  . Knee pain, bilateral 10/30/2010  . Hip pain, bilateral 10/30/2010  . Leg length discrepancy 10/30/2010    Past Medical History:  Diagnosis Date  . Anxiety   . Arthritis    psoriatic arthritis  . Diabetes mellitus without complication (HCC)    no longer on medications, A1C was 5.5 in Aug. 2017. does not check blood sugar at home  . GERD (gastroesophageal reflux disease)   . Hypertension   . Myocardial infarction (HCC)    1997  . Psoriatic  arthritis (HCC)     Family History  Problem Relation Age of Onset  . GER disease Mother   . Heart disease Mother   . Liver cancer Father    Past Surgical History:  Procedure Laterality Date  . CARDIAC CATHETERIZATION     [id number]  . COLONOSCOPY    . EYE SURGERY Bilateral    lasik  . INSERTION OF MESH N/A 04/23/2016   Procedure: INSERTION OF MESH;  Surgeon:  Ovidio Kin, MD;  Location: [facility] OR;  Service: General;  Laterality: N/A;  . KNEE ARTHROPLASTY    . left knee arthroscopy     1990, 1997  . PARTIAL KNEE ARTHROPLASTY Left 02/16/2014   Procedure: LEFT KNEE UNI ARTHROPLASTY;  Surgeon: Eulas Post, MD;  Location: [facility];  Service: Orthopedics;  Laterality: Left;  . UMBILICAL HERNIA REPAIR N/A 04/23/2016   Procedure: LAPAROSCOPIC UMBILICAL HERNIA;  Surgeon: Ovidio Kin, MD;  Location: [facility] OR;  Service: General;  Laterality: N/A;   Social History   Social History Narrative  . Not on file    Objective: Vital Signs: BP (!) 147/82 (BP Location: Left Arm, Patient Position: Sitting, Cuff Size: Normal)   Pulse 75   Resp 15   Ht 5\' 8"  (1.727 m)   Wt 186 lb 6.4 oz (84.6 kg)   BMI 28.34 kg/m    Physical Exam  Constitutional: He is oriented to person, place, and time. He appears well-developed and well-nourished.  HENT:  Head: Normocephalic and atraumatic.  Eyes: Pupils are equal, round, and reactive to light. Conjunctivae and EOM are normal.  Neck: Normal range of motion. Neck supple.  Cardiovascular: Normal rate, regular rhythm and normal heart sounds.  Pulmonary/Chest: Effort normal and breath sounds normal.  Abdominal: Soft. Bowel sounds are normal.  Lymphadenopathy:    He has no cervical adenopathy.  Neurological: He is alert and oriented to person, place, and time.  Skin: Skin is warm and dry. Capillary refill takes less than 2 seconds.  No psoriasis or nail pitting noted.  Psychiatric: He has a normal mood and affect. His behavior is normal.  Nursing note and vitals reviewed.    Musculoskeletal Exam: C-spine slightly limited range of motion.  Thoracic and lumbar spine good range of motion.  No midline spinal tenderness.  Mild bilateral SI joint tenderness.  Shoulder joints, elbow joints, wrist joints, MCPs, PIPs, DIPs good range of motion no synovitis.  He has PIP and DIP synovial thickening.  He is complete  fist formation bilaterally.  Hip joints, knee joints, ankle joints good range of motion with no synovitis. Left partial knee replacement doing well.  No warmth or effusion noted.  No tenderness or swelling of ankle joints.  No Achilles tendinitis or plantar fasciitis.  CDAI Exam: CDAI Score: 0.4  Patient Global Assessment: 2 (mm); Provider Global Assessment: 2 (mm) Swollen: 0 ; Tender: 0  Joint Exam   Not documented   There is currently no information documented on the homunculus. Go to the Rheumatology activity and complete the homunculus joint exam.  Investigation: No additional findings.  Imaging: No results found.  Recent Labs: Lab Results  Component Value Date   WBC 4.7 01/11/2018   HGB 15.5 01/11/2018   PLT 251 01/11/2018   NA 135 01/11/2018   K 4.4 01/11/2018   CL 98 01/11/2018   CO2 27 01/11/2018   GLUCOSE 113 (H) 01/11/2018   BUN 14 01/11/2018   CREATININE 0.87 01/11/2018   BILITOT 0.9 01/11/2018  AST 41 (H) 01/11/2018   ALT 52 (H) 01/11/2018   PROT 7.2 01/11/2018   CALCIUM 9.4 01/11/2018   GFRAA 106 01/11/2018   QFTBGOLDPLUS NEGATIVE 06/21/2017    Speciality Comments: No specialty comments available.  Procedures:  No procedures performed Allergies: Aspirin and Codeine   Assessment / Plan:     Visit Diagnoses: Psoriatic arthropathy (HCC): He has no synovitis or dactylitis on exam.  He recently missed 1 dose of Humira and 3 doses of methotrexate due to being treated for bronchitis.  He underwent 2 rounds of antibiotics and had an IM steroid injection.  He continues to have a dry cough but has not had any recent fevers.  Lungs were clear to auscultation on exam.  No cervical lymphadenopathy is palpable.  He resumed his medications on Saturday.  He has some achiness and stiffness in bilateral hands but no joint swelling.  He has no Achilles tendinitis or plantar fasciitis.  He has mild SI joint pain bilaterally.  He will continue the current treatment regimen of  Humira 40 mg subcu days injections every other week, methotrexate 0.8 mL subcutaneously once weekly and folic acid 2 mg by mouth daily.  He does not need any refills at this time.  He was encouraged to let us know if he develops increased joint pain or joint swelling.  He will follow-up in the office in 5 months.  Psoriasis: He has no active psoriasis at this time.  No nail pitting was noted.  High risk medication use - Humira, MTX, folic acid.  CBC and CMP are drawn on 01/11/2018.  He will return for lab work in November and every 3 months to monitor for drug toxicity.  TB gold was negative on 06/13/2017.  Status post left partial knee replacement: Doing well.  No warmth or effusion.  He has good range of motion with no discomfort.  Other medical conditions are listed as follows:  History of diabetes mellitus  History of coronary artery disease - Followed up by cardiology. He is on statins.  History of hypertension  History of hyperlipidemia   Orders: No orders of the defined types were placed in this encounter.  No orders of the defined types were placed in this encounter.     Follow-Up Instructions: Return in about 5 months (around 08/08/2018) for Psoriatic arthritis.   Gearldine Bienenstock, PA-C  Note - This record has been created using Dragon software.  Chart creation errors have been sought, but may not always  have been located. Such creation errors do not reflect on  the standard of medical care.

## 2018-03-09 ENCOUNTER — Ambulatory Visit: Payer: BC Managed Care – PPO | Admitting: Physician Assistant

## 2018-03-09 ENCOUNTER — Encounter: Payer: Self-pay | Admitting: Physician Assistant

## 2018-03-09 VITALS — BP 147/82 | HR 75 | Resp 15 | Ht 68.0 in | Wt 186.4 lb

## 2018-03-09 DIAGNOSIS — Z8639 Personal history of other endocrine, nutritional and metabolic disease: Secondary | ICD-10-CM

## 2018-03-09 DIAGNOSIS — Z79899 Other long term (current) drug therapy: Secondary | ICD-10-CM

## 2018-03-09 DIAGNOSIS — L409 Psoriasis, unspecified: Secondary | ICD-10-CM

## 2018-03-09 DIAGNOSIS — Z96652 Presence of left artificial knee joint: Secondary | ICD-10-CM | POA: Diagnosis not present

## 2018-03-09 DIAGNOSIS — L4050 Arthropathic psoriasis, unspecified: Secondary | ICD-10-CM | POA: Diagnosis not present

## 2018-03-09 DIAGNOSIS — Z8679 Personal history of other diseases of the circulatory system: Secondary | ICD-10-CM

## 2018-03-09 NOTE — Patient Instructions (Signed)
Standing Labs We placed an order today for your standing lab work.    Please come back and get your standing labs in November and every 3 months  We have open lab Monday through Friday from 8:30-11:30 AM and 1:30-4:00 PM  at the office of Dr. Shaili Deveshwar.   You may experience shorter wait times on Monday and Friday afternoons. The office is located at [address], [address], [place], [place] [address] No appointment is necessary.   Labs are drawn by [facility].  You may receive a bill from [facility] for your lab work. If you have any questions regarding directions or hours of operation,  please call [phone].   Just as a reminder please drink plenty of water prior to coming for your lab work. Thanks!  

## 2018-03-10 ENCOUNTER — Telehealth: Payer: Self-pay | Admitting: Podiatry

## 2018-03-10 MED ORDER — KETOCONAZOLE 2 % EX CREA: 1.0000 "application " | TOPICAL_CREAM | Freq: Every day | CUTANEOUS | 0 refills | Status: DC

## 2018-03-10 NOTE — Telephone Encounter (Signed)
"   I need to have my Ketoconazole medication refilled at [facility] on [address]."

## 2018-03-10 NOTE — Addendum Note (Signed)
Addended by: Harriett Sine D on: 03/10/2018 04:41 PM   Modules accepted: Orders

## 2018-03-11 ENCOUNTER — Other Ambulatory Visit: Payer: Self-pay | Admitting: Rheumatology

## 2018-03-11 NOTE — Telephone Encounter (Signed)
Last Visit: 03/09/18  Next Visit: 08/15/18 Labs: 12/30/17 LFTs are elevated. decrease methotrexate to 0.8 mL subcu weekly  Okay to refill per Dr. Estanislado Pandy

## 2018-04-15 ENCOUNTER — Other Ambulatory Visit: Payer: Self-pay

## 2018-04-15 DIAGNOSIS — Z79899 Other long term (current) drug therapy: Secondary | ICD-10-CM

## 2018-04-15 LAB — CBC WITH DIFFERENTIAL/PLATELET
Basophils Absolute: 29 cells/uL (ref 0–200)
Basophils Relative: 0.5 %
EOS ABS: 110 {cells}/uL (ref 15–500)
Eosinophils Relative: 1.9 %
HCT: 45.4 % (ref 38.5–50.0)
Hemoglobin: 15.3 g/dL (ref 13.2–17.1)
Lymphs Abs: 2778 cells/uL (ref 850–3900)
MCH: 29.5 pg (ref 27.0–33.0)
MCHC: 33.7 g/dL (ref 32.0–36.0)
MCV: 87.5 fL (ref 80.0–100.0)
MPV: 10 fL (ref 7.5–12.5)
Monocytes Relative: 8.9 %
NEUTROS PCT: 40.8 %
Neutro Abs: 2366 cells/uL (ref 1500–7800)
PLATELETS: 249 10*3/uL (ref 140–400)
RBC: 5.19 10*6/uL (ref 4.20–5.80)
RDW: 16.2 % — AB (ref 11.0–15.0)
TOTAL LYMPHOCYTE: 47.9 %
WBC: 5.8 10*3/uL (ref 3.8–10.8)
WBCMIX: 516 {cells}/uL (ref 200–950)

## 2018-04-15 LAB — COMPLETE METABOLIC PANEL WITH GFR
AG RATIO: 1.7 (calc) (ref 1.0–2.5)
ALBUMIN MSPROF: 4.1 g/dL (ref 3.6–5.1)
ALKALINE PHOSPHATASE (APISO): 45 U/L (ref 40–115)
ALT: 38 U/L (ref 9–46)
AST: 27 U/L (ref 10–35)
BUN: 16 mg/dL (ref 7–25)
CO2: 28 mmol/L (ref 20–32)
CREATININE: 0.87 mg/dL (ref 0.70–1.25)
Calcium: 9.1 mg/dL (ref 8.6–10.3)
Chloride: 101 mmol/L (ref 98–110)
GFR, Est African American: 106 mL/min/{1.73_m2} (ref >=60)
GFR, Est Non African American: 91 mL/min/{1.73_m2} (ref >=60)
GLOBULIN: 2.4 g/dL (ref 1.9–3.7)
Glucose, Bld: 121 mg/dL — ABNORMAL HIGH (ref 65–99)
Potassium: 4.2 mmol/L (ref 3.5–5.3)
Sodium: 137 mmol/L (ref 135–146)
Total Bilirubin: 0.9 mg/dL (ref 0.2–1.2)
Total Protein: 6.5 g/dL (ref 6.1–8.1)

## 2018-04-18 ENCOUNTER — Telehealth: Payer: Self-pay | Admitting: Rheumatology

## 2018-04-18 NOTE — Telephone Encounter (Signed)
Advised patient of lab results, patient verbalized understanding.  

## 2018-04-18 NOTE — Telephone Encounter (Signed)
Patient called stating he was returning Brandon Frazier's call regarding his bloodwork.

## 2018-04-29 ENCOUNTER — Other Ambulatory Visit: Payer: Self-pay | Admitting: Rheumatology

## 2018-05-02 NOTE — Telephone Encounter (Signed)
Last Visit: 03/09/18  Next Visit: 08/15/18 Labs: 04/15/18 Glucose is 121. All other labs are WNL TB Gold: 06/21/17 Neg   Okay to refill per Dr. Estanislado Pandy

## 2018-05-26 ENCOUNTER — Telehealth: Payer: Self-pay | Admitting: Rheumatology

## 2018-05-26 MED ORDER — PREDNISONE 5 MG PO TABS: ORAL_TABLET | ORAL | 0 refills | Status: DC

## 2018-05-26 NOTE — Telephone Encounter (Signed)
Prescription sent to the pharmacy. Patient advised.  

## 2018-05-26 NOTE — Addendum Note (Signed)
Addended by: Carole Binning on: 05/26/2018 04:20 PM   Modules accepted: Orders

## 2018-05-26 NOTE — Telephone Encounter (Signed)
Patient states he is having pain in his lower back, hands and arms. Patient states he does not notice any obvious swelling in his joints. Patient states he had a sinus infection and bronchitis. Patient had to hold Humira and MTX in October 2019. Patient states he has been back on his medication since the second week in November 2019. Patient is due for his next MTX and Humira are due 05/28/18. Patient is requesting a prescription for prednisone. Please advise.

## 2018-05-26 NOTE — Telephone Encounter (Signed)
Patient left a voicemail stating he was returning a call to the office.   °

## 2018-05-26 NOTE — Telephone Encounter (Signed)
Ok to send in prednisone taper starting at 20 mg and taper by 5 mg every 4 days.

## 2018-07-28 ENCOUNTER — Other Ambulatory Visit: Payer: Self-pay | Admitting: Rheumatology

## 2018-07-28 NOTE — Telephone Encounter (Addendum)
Last Visit: 03/09/18  Next Visit: 08/15/18 Labs: 04/15/18 Glucose is 121. All other labs are WNL TB Gold: 06/21/17 Neg   Left message to advise patient he is due to update labs.  Okay to refill 30 day supply per Dr. Estanislado Pandy

## 2018-08-01 NOTE — Progress Notes (Signed)
Office Visit Note  Patient: Brandon Frazier             Date of Birth: September 12, 1955           MRN: [id number]             PCP: Ignatius Specking, MD Referring: Ignatius Specking, MD Visit Date: 08/15/2018 Occupation: @GUAROCC @  Subjective:  Joint stiffness    History of Present Illness: Brandon Frazier is a 63 y.o. male with history of psoriatic arthritis.  He is on Humira 40 mg sq once every 2 weeks, MTX 0.8 ml sq once weekly, and folic acid 2 mg po daily.  He denies missing any doses recently.  He denies any recent infections.  He reports his last flare was in January 2020 and he completed a prednisone taper.  He denies any joint pain or joint swelling at this time.  He reports some increased stiffness in the morning and if sitting for prolonged periods of time.  He continues to exercise 5 days per week. He reports occasional right knee joint pain after strenuous exercise.  He will be following up with Dr. Dion Saucier.  He continues to see a massage therapist for neck stiffness, which has been helping with ROM.     Activities of Daily Living:  Patient reports morning stiffness for 20-30 minutes.   Patient Denies nocturnal pain.  Difficulty dressing/grooming: Denies Difficulty climbing stairs: Denies Difficulty getting out of chair: Reports Difficulty using hands for taps, buttons, cutlery, and/or writing: Denies  Review of Systems  Constitutional: Negative for fatigue and night sweats.  HENT: Negative for mouth sores, mouth dryness and nose dryness.   Eyes: Negative for redness, visual disturbance and dryness.  Respiratory: Negative for cough, hemoptysis, shortness of breath and difficulty breathing.   Cardiovascular: Negative for chest pain, palpitations, hypertension, irregular heartbeat and swelling in legs/feet.  Gastrointestinal: Negative for blood in stool, constipation and diarrhea.  Endocrine: Negative for increased urination.  Genitourinary: Negative for painful urination.    Musculoskeletal: Positive for morning stiffness. Negative for arthralgias, joint pain, joint swelling, myalgias, muscle weakness, muscle tenderness and myalgias.  Skin: Negative for color change, rash, hair loss, nodules/bumps, skin tightness, ulcers and sensitivity to sunlight.  Allergic/Immunologic: Negative for susceptible to infections.  Neurological: Negative for dizziness, fainting, memory loss, night sweats and weakness.  Hematological: Negative for swollen glands.  Psychiatric/Behavioral: Negative for depressed mood and sleep disturbance. The patient is not nervous/anxious.     PMFS History:  Patient Active Problem List   Diagnosis Date Noted  . Psoriatic arthropathy (HCC) 06/05/2016  . History of hypertension 06/05/2016  . History of diabetes mellitus 06/05/2016  . History of coronary artery disease 06/05/2016  . History of hyperlipidemia 06/05/2016  . High risk medication use 06/05/2016  . Status post left partial knee replacement 06/05/2016  . Primary localized osteoarthrosis, lower leg 02/16/2014  . 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 Psoriasis 12/10/2010  . Knee pain, bilateral 10/30/2010  . Hip pain, bilateral 10/30/2010  . Leg length discrepancy 10/30/2010    Past Medical History:  Diagnosis Date  . Anxiety   . Arthritis    psoriatic arthritis  . Diabetes mellitus without complication (HCC)    no longer on medications, A1C was 5.5 in Aug. 2017. does not check blood sugar at home  . GERD (gastroesophageal reflux disease)   . Hypertension   . Myocardial infarction (HCC)    1997  . Psoriatic arthritis (HCC)  Family History  Problem Relation Age of Onset  . GER disease Mother   . Heart disease Mother   . Liver cancer Father    Past Surgical History:  Procedure Laterality Date  . CARDIAC CATHETERIZATION     [id number]  . COLONOSCOPY    . EYE SURGERY Bilateral    lasik  . INSERTION OF MESH N/A 04/23/2016   Procedure: INSERTION OF MESH;  Surgeon: Ovidio Kin,  MD;  Location: [facility] OR;  Service: General;  Laterality: N/A;  . KNEE ARTHROPLASTY    . left knee arthroscopy     1990, 1997  . PARTIAL KNEE ARTHROPLASTY Left 02/16/2014   Procedure: LEFT KNEE UNI ARTHROPLASTY;  Surgeon: Eulas Post, MD;  Location: [facility];  Service: Orthopedics;  Laterality: Left;  . UMBILICAL HERNIA REPAIR N/A 04/23/2016   Procedure: LAPAROSCOPIC UMBILICAL HERNIA;  Surgeon: Ovidio Kin, MD;  Location: [facility] OR;  Service: General;  Laterality: N/A;   Social History   Social History Narrative  . Not on file   Immunization History  Administered Date(s) Administered  . Influenza,inj,Quad PF,6+ Mos 03/19/2017, 03/24/2018  . Tdap 05/14/2018     Objective: Vital Signs: BP (!) 151/80 (BP Location: Left Arm, Patient Position: Sitting, Cuff Size: Normal)   Pulse (!) 59   Resp 14   Ht 5\' 8"  (1.727 m)   Wt 197 lb 6.4 oz (89.5 kg)   BMI 30.01 kg/m    Physical Exam Vitals signs and nursing note reviewed.  Constitutional:      Appearance: He is well-developed.  HENT:     Head: Normocephalic and atraumatic.  Eyes:     Conjunctiva/sclera: Conjunctivae normal.     Pupils: Pupils are equal, round, and reactive to light.  Neck:     Musculoskeletal: Normal range of motion and neck supple.  Cardiovascular:     Rate and Rhythm: Normal rate and regular rhythm.     Heart sounds: Normal heart sounds.  Pulmonary:     Effort: Pulmonary effort is normal.     Breath sounds: Normal breath sounds.  Abdominal:     General: Bowel sounds are normal.     Palpations: Abdomen is soft.  Lymphadenopathy:     Cervical: No cervical adenopathy.  Skin:    General: Skin is warm and dry.     Capillary Refill: Capillary refill takes less than 2 seconds.  Neurological:     Mental Status: He is alert and oriented to person, place, and time.  Psychiatric:        Behavior: Behavior normal.      Musculoskeletal Exam: C-spine, thoracic spine, and lumbar spine good ROM.  No  midline spinal tenderness.  No SI joint tenderness.  Shoulder joints, elbow joints, wrist joints, MCPs, PIPs, and DIPs good ROM with no synovitis. PIP and DIP synovial thickening but no synovitis.  Hip joints, knee joints, ankle joints, MTPs, PIPs, and DIPs good ROM with no synovitis.  Mild warmth of right knee joint.  No tenderness or swelling of ankle joints.  No achilles tendonitis or plantar fasciitis.  No tenderness over trochanteric bursa bilaterally.   CDAI Exam: CDAI Score: Not documented Patient Global Assessment: Not documented; Provider Global Assessment: Not documented Swollen: Not documented; Tender: Not documented Joint Exam   Not documented   There is currently no information documented on the homunculus. Go to the Rheumatology activity and complete the homunculus joint exam.  Investigation: No additional findings.  Imaging: No results found.  Recent  Labs: Lab Results  Component Value Date   WBC 5.6 08/08/2018   HGB 16.0 08/08/2018   PLT 237 08/08/2018   NA 138 08/08/2018   K 4.2 08/08/2018   CL 103 08/08/2018   CO2 27 08/08/2018   GLUCOSE 122 (H) 08/08/2018   BUN 16 08/08/2018   CREATININE 0.98 08/08/2018   BILITOT 0.8 08/08/2018   AST 33 08/08/2018   ALT 44 08/08/2018   PROT 6.3 08/08/2018   CALCIUM 9.3 08/08/2018   GFRAA 94 08/08/2018   QFTBGOLDPLUS NEGATIVE 06/21/2017    Speciality Comments: No specialty comments available.  Procedures:  No procedures performed Allergies: Aspirin and Codeine   Assessment / Plan:     Visit Diagnoses: Psoriatic arthropathy (HCC): He has no active synovitis or dactylitis on exam.  He has no joint pain or joint swelling at this time.  He has noticed increased morning stiffness and stiffness after sitting for prolonged periods of time.  He has no achilles tendonitis or plantar fasciitis.  No SI joint tenderness. He had a flare in January 2020 and completed a prednisone taper.  He has not had any other recent flares.  He is  clinically doing well on Humira 40 mg sq every 14 days, MTX 0.8 ml sq once weekly, and folic acid 2 mg po daily.  He has not missed any doses.  He has not had any recent infections.  He will continue on this current treatment regimen.  He does not need any refills at this time.  He was advised to notify us if he develops increased joint pain or joint swelling.  He will follow up in 5 months.   Psoriasis: He has no active psoriasis at this time.   High risk medication use - Humira every 14 days, methotrexate 0.8 mL weekly, folic acid 1 mg 2 tablets daily.  Last TB gold negative on 06/21/2017.  Due for TB gold today and will monitor yearly.  Most recent CBC/CMP within normal limits on 04/15/2018.  Due for CBC/CMP today and will monitor every 3 months.  Standing orders are in place.  He received his flu vaccine in October.   - Plan: QuantiFERON-TB Gold Plus  Status post left partial knee replacement: Dr. Dion Saucier- No warmth or effusion.  He has good ROM with no discomfort.   Other medical conditions are listed as follows:   History of hypertension  History of hyperlipidemia  History of diabetes mellitus  History of coronary artery disease - Followed up by cardiology. He is on statins.   Orders: Orders Placed This Encounter  Procedures  . QuantiFERON-TB Gold Plus   No orders of the defined types were placed in this encounter.   Follow-Up Instructions: Return in about 5 months (around 01/15/2019) for Psoriatic arthritis.   Gearldine Bienenstock, PA-C  I examined and evaluated the patient with Sherron Ales PA.  No synovitis on examination today.  He continues to have some discomfort in his right knee joint which will eventually require surgery.  Left partial knee replacement is doing well.  The plan of care was discussed as noted above.  Pollyann Savoy, MD  Note - This record has been created using Animal nutritionist.  Chart creation errors have been sought, but may not always  have been located.  Such creation errors do not reflect on  the standard of medical care.

## 2018-08-08 ENCOUNTER — Other Ambulatory Visit: Payer: Self-pay

## 2018-08-08 DIAGNOSIS — Z79899 Other long term (current) drug therapy: Secondary | ICD-10-CM

## 2018-08-09 LAB — CBC WITH DIFFERENTIAL/PLATELET
Absolute Monocytes: 549 cells/uL (ref 200–950)
BASOS PCT: 0.7 %
Basophils Absolute: 39 cells/uL (ref 0–200)
EOS ABS: 112 {cells}/uL (ref 15–500)
EOS PCT: 2 %
HCT: 47.1 % (ref 38.5–50.0)
HEMOGLOBIN: 16 g/dL (ref 13.2–17.1)
Lymphs Abs: 2626 cells/uL (ref 850–3900)
MCH: 31.4 pg (ref 27.0–33.0)
MCHC: 34 g/dL (ref 32.0–36.0)
MCV: 92.5 fL (ref 80.0–100.0)
MONOS PCT: 9.8 %
MPV: 10.2 fL (ref 7.5–12.5)
Neutro Abs: 2274 cells/uL (ref 1500–7800)
Neutrophils Relative %: 40.6 %
Platelets: 237 10*3/uL (ref 140–400)
RBC: 5.09 10*6/uL (ref 4.20–5.80)
RDW: 14.7 % (ref 11.0–15.0)
Total Lymphocyte: 46.9 %
WBC: 5.6 10*3/uL (ref 3.8–10.8)

## 2018-08-09 LAB — COMPLETE METABOLIC PANEL WITH GFR
AG Ratio: 1.6 (calc) (ref 1.0–2.5)
ALKALINE PHOSPHATASE (APISO): 40 U/L (ref 35–144)
ALT: 44 U/L (ref 9–46)
AST: 33 U/L (ref 10–35)
Albumin: 3.9 g/dL (ref 3.6–5.1)
BILIRUBIN TOTAL: 0.8 mg/dL (ref 0.2–1.2)
BUN: 16 mg/dL (ref 7–25)
CHLORIDE: 103 mmol/L (ref 98–110)
CO2: 27 mmol/L (ref 20–32)
CREATININE: 0.98 mg/dL (ref 0.70–1.25)
Calcium: 9.3 mg/dL (ref 8.6–10.3)
GFR, Est African American: 94 mL/min/{1.73_m2} (ref >=60)
GFR, Est Non African American: 81 mL/min/{1.73_m2} (ref >=60)
GLUCOSE: 122 mg/dL — AB (ref 65–99)
Globulin: 2.4 g/dL (calc) (ref 1.9–3.7)
Potassium: 4.2 mmol/L (ref 3.5–5.3)
Sodium: 138 mmol/L (ref 135–146)
Total Protein: 6.3 g/dL (ref 6.1–8.1)

## 2018-08-15 ENCOUNTER — Other Ambulatory Visit: Payer: Self-pay

## 2018-08-15 ENCOUNTER — Ambulatory Visit: Payer: BC Managed Care – PPO | Admitting: Rheumatology

## 2018-08-15 ENCOUNTER — Encounter: Payer: Self-pay | Admitting: Physician Assistant

## 2018-08-15 VITALS — BP 151/80 | HR 59 | Resp 14 | Ht 68.0 in | Wt 197.4 lb

## 2018-08-15 DIAGNOSIS — Z79899 Other long term (current) drug therapy: Secondary | ICD-10-CM | POA: Diagnosis not present

## 2018-08-15 DIAGNOSIS — L409 Psoriasis, unspecified: Secondary | ICD-10-CM

## 2018-08-15 DIAGNOSIS — L4050 Arthropathic psoriasis, unspecified: Secondary | ICD-10-CM | POA: Diagnosis not present

## 2018-08-15 DIAGNOSIS — Z96652 Presence of left artificial knee joint: Secondary | ICD-10-CM | POA: Diagnosis not present

## 2018-08-15 DIAGNOSIS — Z8679 Personal history of other diseases of the circulatory system: Secondary | ICD-10-CM

## 2018-08-15 DIAGNOSIS — Z8639 Personal history of other endocrine, nutritional and metabolic disease: Secondary | ICD-10-CM

## 2018-08-17 LAB — QUANTIFERON-TB GOLD PLUS
Mitogen-NIL: 7.99 IU/mL
NIL: 0.03 IU/mL
QuantiFERON-TB Gold Plus: NEGATIVE
TB1-NIL: 0.01 IU/mL
TB2-NIL: 0 IU/mL

## 2018-08-17 NOTE — Progress Notes (Signed)
TB gold negative

## 2018-09-22 ENCOUNTER — Other Ambulatory Visit: Payer: Self-pay | Admitting: Rheumatology

## 2018-09-22 NOTE — Telephone Encounter (Signed)
Last Visit: 08/15/18 Next visit: 01/16/19 Labs: 08/08/18 Glucose is 122. Rest of lab work is WNL. TB Gold: 08/15/18  Okay to refill per Dr. Estanislado Pandy

## 2018-10-05 ENCOUNTER — Ambulatory Visit: Payer: Self-pay | Admitting: Cardiology

## 2018-11-30 ENCOUNTER — Telehealth: Payer: Self-pay | Admitting: Rheumatology

## 2018-11-30 NOTE — Telephone Encounter (Signed)
Patient left a voicemail stating he is having discomfort in his lower back.  Patient requested to speak with you directly.

## 2018-11-30 NOTE — Telephone Encounter (Signed)
Give prednisone taper starting at 20 mg p.o. daily and taper by 5 mg every 4 days.  If he has frequent flares then he may have to schedule an appointment to change therapy.

## 2018-11-30 NOTE — Telephone Encounter (Addendum)
Patient states he is having increased lower back pain and pain in his knees over the last week. Patient states he is having swelling in his knees. Patient states he is wearing compression stockings  Patient states he has backed off the exercise. Patient took Humira and MTX 0.8 mL weekly last on 11/26/18. Patient will update labs 12/02/18. Patient last hand prednisone in January 2020. Patient last seen on 08/15/18 and due to follow up 01/16/19. Labs 08/08/18 Glucose is 122. Rest of lab work is WNL. Please advise.

## 2018-12-01 MED ORDER — METHOTREXATE SODIUM CHEMO INJECTION 50 MG/2ML: 20.0000 mg | INTRAMUSCULAR | 0 refills | Status: DC

## 2018-12-01 MED ORDER — PREDNISONE 5 MG PO TABS: ORAL_TABLET | ORAL | 0 refills | Status: DC

## 2018-12-01 NOTE — Telephone Encounter (Signed)
Patient advised prescription for Prednisone sent to the pharmacy along with refill for MTX. Patient advised if he has more frequent flares he will need to make appointment to discuss change to therapy.

## 2018-12-02 ENCOUNTER — Telehealth: Payer: Self-pay | Admitting: *Deleted

## 2018-12-02 ENCOUNTER — Other Ambulatory Visit: Payer: Self-pay | Admitting: *Deleted

## 2018-12-02 ENCOUNTER — Other Ambulatory Visit: Payer: Self-pay

## 2018-12-02 DIAGNOSIS — Z79899 Other long term (current) drug therapy: Secondary | ICD-10-CM

## 2018-12-02 MED ORDER — "TUBERCULIN SYRINGE 27G X 1/2"" 1 ML MISC": 12.0000 | 3 refills | Status: DC

## 2018-12-02 NOTE — Telephone Encounter (Signed)
Attempted to contact patient and left message on machine for patient to call the office.  

## 2018-12-02 NOTE — Telephone Encounter (Signed)
Last Visit: 08/15/18 Next visit: 01/16/19  Okay to refill per Dr. Estanislado Pandy

## 2018-12-02 NOTE — Telephone Encounter (Signed)
[facility] stating [facility] are unable to get [facility] and  prescription will need to be sent to another pharmacy. Left message to advise patient to call back with another pharmacy option.

## 2018-12-03 LAB — COMPLETE METABOLIC PANEL WITH GFR
AG Ratio: 1.8 (calc) (ref 1.0–2.5)
ALT: 48 U/L — ABNORMAL HIGH (ref 9–46)
AST: 30 U/L (ref 10–35)
Albumin: 4.4 g/dL (ref 3.6–5.1)
Alkaline phosphatase (APISO): 47 U/L (ref 35–144)
BUN: 17 mg/dL (ref 7–25)
CO2: 28 mmol/L (ref 20–32)
Calcium: 9.7 mg/dL (ref 8.6–10.3)
Chloride: 101 mmol/L (ref 98–110)
Creat: 0.86 mg/dL (ref 0.70–1.25)
GFR, Est African American: 105 mL/min/{1.73_m2} (ref >=60)
GFR, Est Non African American: 91 mL/min/{1.73_m2} (ref >=60)
Globulin: 2.5 g/dL (calc) (ref 1.9–3.7)
Glucose, Bld: 137 mg/dL — ABNORMAL HIGH (ref 65–99)
Potassium: 4.5 mmol/L (ref 3.5–5.3)
Sodium: 138 mmol/L (ref 135–146)
Total Bilirubin: 0.8 mg/dL (ref 0.2–1.2)
Total Protein: 6.9 g/dL (ref 6.1–8.1)

## 2018-12-03 LAB — CBC WITH DIFFERENTIAL/PLATELET
Absolute Monocytes: 416 cells/uL (ref 200–950)
Basophils Absolute: 38 cells/uL (ref 0–200)
Basophils Relative: 0.7 %
Eosinophils Absolute: 119 cells/uL (ref 15–500)
Eosinophils Relative: 2.2 %
HCT: 50.6 % — ABNORMAL HIGH (ref 38.5–50.0)
Hemoglobin: 17.1 g/dL (ref 13.2–17.1)
Lymphs Abs: 1987 cells/uL (ref 850–3900)
MCH: 32.4 pg (ref 27.0–33.0)
MCHC: 33.8 g/dL (ref 32.0–36.0)
MCV: 95.8 fL (ref 80.0–100.0)
MPV: 10 fL (ref 7.5–12.5)
Monocytes Relative: 7.7 %
Neutro Abs: 2840 cells/uL (ref 1500–7800)
Neutrophils Relative %: 52.6 %
Platelets: 250 10*3/uL (ref 140–400)
RBC: 5.28 10*6/uL (ref 4.20–5.80)
RDW: 14.5 % (ref 11.0–15.0)
Total Lymphocyte: 36.8 %
WBC: 5.4 10*3/uL (ref 3.8–10.8)

## 2018-12-05 MED ORDER — "TUBERCULIN SYRINGE 27G X 1/2"" 1 ML MISC": 12.0000 | 3 refills | Status: AC

## 2018-12-05 MED ORDER — "BD DISP NEEDLES 27G X 1/2"" MISC": 3 refills | Status: AC

## 2018-12-05 NOTE — Addendum Note (Signed)
Addended by: Carole Binning on: 12/05/2018 04:32 PM   Modules accepted: Orders

## 2018-12-13 ENCOUNTER — Other Ambulatory Visit: Payer: Self-pay

## 2018-12-13 ENCOUNTER — Encounter: Payer: Self-pay | Admitting: *Deleted

## 2018-12-13 ENCOUNTER — Ambulatory Visit (INDEPENDENT_AMBULATORY_CARE_PROVIDER_SITE_OTHER): Payer: BC Managed Care – PPO | Admitting: Cardiology

## 2018-12-13 ENCOUNTER — Encounter: Payer: Self-pay | Admitting: Cardiology

## 2018-12-13 ENCOUNTER — Encounter

## 2018-12-13 VITALS — BP 132/73 | HR 76 | Temp 97.3°F | Ht 68.0 in | Wt 195.0 lb

## 2018-12-13 DIAGNOSIS — E782 Mixed hyperlipidemia: Secondary | ICD-10-CM

## 2018-12-13 DIAGNOSIS — Z01810 Encounter for preprocedural cardiovascular examination: Secondary | ICD-10-CM

## 2018-12-13 DIAGNOSIS — I25119 Atherosclerotic heart disease of native coronary artery with unspecified angina pectoris: Secondary | ICD-10-CM

## 2018-12-13 DIAGNOSIS — I10 Essential (primary) hypertension: Secondary | ICD-10-CM

## 2018-12-13 NOTE — Patient Instructions (Addendum)
Medication Instructions:   Your physician recommends that you continue on your current medications as directed. Please refer to the Current Medication list given to you today.  Labwork:  NONE  Testing/Procedures: Your physician has requested that you have an echocardiogram. Echocardiography is a painless test that uses sound waves to create images of your heart. It provides your doctor with information about the size and shape of your heart and how well your heart's chambers and valves are working. This procedure takes approximately one hour. There are no restrictions for this procedure.  Follow-Up:  Your physician recommends that you schedule a follow-up appointment in: pending test result.  Any Other Special Instructions Will Be Listed Below (If Applicable).  If you need a refill on your cardiac medications before your next appointment, please call your pharmacy. 

## 2018-12-13 NOTE — Progress Notes (Signed)
Cardiology Office Note  Date: 12/13/2018   ID: Brandon Frazier, DOB 04-Nov-1955, MRN [id number]  PCP:  Ignatius Specking, MD  Consulting Cardiologist:  Nona Dell, MD Electrophysiologist:  None   Chief Complaint  Patient presents with  . Preoperative cardiac evaluation    History of Present Illness: Brandon Frazier is a 63 y.o. male referred for cardiology consultation by Dr. Dion Saucier for preoperative cardiac evaluation prior to right knee replacement.  He presents reporting no angina symptoms or increasing shortness of breath with typical activities.  He has been doing a home CrossFit regimen, mainly aerobic for about 30 minutes, and states that when he exercises indoors he has felt fine.  He has however not tolerated the high heat and humidity very well.  He does have limitations related to right knee pain.  I reviewed available records, he has a history of anterior wall myocardial infarction in 1995 treated with angioplasty of the LAD.  He subsequently underwent a cardiac catheterization in 2001 which demonstrated only mild nonobstructive disease with patent angioplasty site in the LAD.  I personally reviewed his ECG today which shows sinus rhythm with decreased R wave progression, rule out old anterior infarct pattern.  RCRI perioperative risk calculator indicates class II, 0.9% chance of major cardiac event.  Medications are outlined below.  Past Medical History:  Diagnosis Date  . Anterior myocardial infarction (HCC) 1995  . Anxiety   . CAD (coronary artery disease)    Angioplasty of LAD 1995  . Essential hypertension   . GERD (gastroesophageal reflux disease)   . Psoriatic arthritis (HCC)   . Type 2 diabetes mellitus (HCC)     Past Surgical History:  Procedure Laterality Date  . CARDIAC CATHETERIZATION     1995, 2001  . COLONOSCOPY    . EYE SURGERY Bilateral    lasik  . INSERTION OF MESH N/A 04/23/2016   Procedure: INSERTION OF MESH;  Surgeon: Ovidio Kin, MD;   Location: [facility] OR;  Service: General;  Laterality: N/A;  . KNEE ARTHROPLASTY    . left knee arthroscopy     1990, 1997  . PARTIAL KNEE ARTHROPLASTY Left 02/16/2014   Procedure: LEFT KNEE UNI ARTHROPLASTY;  Surgeon: Eulas Post, MD;  Location: [facility];  Service: Orthopedics;  Laterality: Left;  . UMBILICAL HERNIA REPAIR N/A 04/23/2016   Procedure: LAPAROSCOPIC UMBILICAL HERNIA;  Surgeon: Ovidio Kin, MD;  Location: [facility] OR;  Service: General;  Laterality: N/A;    Current Outpatient Medications  Medication Sig Dispense Refill  . benazepril-hydrochlorthiazide (LOTENSIN HCT) 20-25 MG per tablet Take 1 tablet by mouth daily.      . folic acid (FOLVITE) 1 MG tablet TAKE 2 TABLETS BY MOUTH DAILY 180 tablet 3  . HUMIRA PEN 40 MG/0.8ML PNKT INJECT ONE PEN SUBCUTANEOUSLY EVERY OTHER WEEK. REFRIGERATE. 6 each 0  . ketoconazole (NIZORAL) 2 % cream Apply 1 application topically daily. (Patient taking differently: Apply 1 application topically as needed. ) 60 g 0  . methotrexate 50 MG/2ML injection Inject 0.8 mLs (20 mg total) into the skin once a week. 10 mL 0  . NEEDLE, DISP, 27 G (BD DISP NEEDLES) 27G X 1/2" MISC Patient to use to inject MTX once weekly 12 each 3  . NONFORMULARY OR COMPOUNDED ITEM [facility]:  Onychomycosis Nail Lacquer - Fluconazole 2%, Terbinafine 1%, DMSO, +Urea 20%, apply to affected toenails daily. 120 each 3  . omeprazole (PRILOSEC) 20 MG capsule Take 20 mg by mouth  daily.     Marland Kitchen PARoxetine (PAXIL) 10 MG tablet Take 10 mg by mouth at bedtime.     . pravastatin (PRAVACHOL) 40 MG tablet Take 40 mg by mouth at bedtime.     . predniSONE (DELTASONE) 5 MG tablet Take 4 tabs po x 4 days, 3  tabs po x 4 days, 2  tabs po x 4 days, 1  tab po x 4 days 40 tablet 0  . predniSONE (DELTASONE) 5 MG tablet Take 4 tabs po x 4 days, 3  tabs po x 4 days, 2  tabs po x 4 days, 1  tab po x 4 days 40 tablet 0  . testosterone cypionate (DEPOTESTOSTERONE CYPIONATE) 200 MG/ML  injection Inject 50 mg into the muscle once a week.   1  . thyroid (NP THYROID) 30 MG tablet Take 60 mg by mouth daily before breakfast.    . TUBERCULIN SYR 1CC/27GX1/2" (B-D TB SYRINGE 1CC/27GX1/2") 27G X 1/2" 1 ML MISC 12 Syringes by Does not apply route once a week. 12 each 3   No current facility-administered medications for this visit.    Allergies:  Aspirin and Codeine   Social History: The patient  reports that he quit smoking about 23 years ago. His smoking use included cigarettes. He has a 40.00 pack-year smoking history. He has never used smokeless tobacco. He reports current alcohol use. He reports that he does not use drugs.   Family History: The patient's family history includes GER disease in his mother; Heart disease in his mother; Liver cancer in his father.   ROS:  Please see the history of present illness. Otherwise, complete review of systems is positive for right knee pain.  All other systems are reviewed and negative.   Physical Exam: VS:  BP 132/73   Pulse 76   Temp (!) 97.3 F (36.3 C)   Ht 5\' 8"  (1.727 m)   Wt 195 lb (88.5 kg)   SpO2 97%   BMI 29.65 kg/m , BMI Body mass index is 29.65 kg/m.  Wt Readings from Last 3 Encounters:  12/13/18 195 lb (88.5 kg)  08/15/18 197 lb 6.4 oz (89.5 kg)  03/09/18 186 lb 6.4 oz (84.6 kg)    General: Patient appears comfortable at rest. HEENT: Conjunctiva and lids normal, wearing a mask. Neck: Supple, no elevated JVP or carotid bruits, no thyromegaly. Lungs: Clear to auscultation, nonlabored breathing at rest. Cardiac: Regular rate and rhythm, no S3 or significant systolic murmur. Abdomen: Soft, nontender, bowel sounds present, no guarding or rebound. Extremities: No pitting edema, distal pulses 2+. Skin: Warm and dry. Musculoskeletal: No kyphosis. Neuropsychiatric: Alert and oriented x3, affect grossly appropriate.  ECG:  An ECG dated 04/22/2016 was personally reviewed today and demonstrated:  Sinus bradycardia.   Recent Labwork: 12/02/2018: ALT 48; AST 30; BUN 17; Creat 0.86; Hemoglobin 17.1; Platelets 250; Potassium 4.5; Sodium 138   Other Studies Reviewed Today:  Cardiac catheterization 07/31/1999: FINDINGS: 1. The left main coronary artery had 20% discrete stenosis. 2. The left anterior descending artery was widely patent at the site of the    previous angioplasty site, and there was 30% multiple discrete lesions in the    mid and distal vessel. 3. The diagonal branches were all small and unchanged from previously. 4. The circumflex coronary artery consisted primarily of one large obtuse marginal    branch which was normal. 5. The right coronary artery was small in caliber.  There were no significant    stenoses.  There was one posterolateral branch and one PDA.  An RAO ventriculography revealed mild anterior apical wall hypokinesis which is unchanged from previous catheterization.  The ejection fraction is in the 50% range.  There was no gradient across the aortic valve and no MR.  HEMODYNAMIC DATA:  Left ventricular pressure was 130/21, and aortic pressure was 138/74.  Assessment and Plan:  1.  Preoperative evaluation in a 63 year old male with a history of remote anterior wall infarct in 1995 status post LAD angioplasty (patent as of 2001), essential hypertension, type 2 diabetes mellitus, and hyperlipidemia.  He is doing well without active angina and describes activities exceeding 4 METS without symptoms.  ECG is consistent with old anterior infarct.  Based on his description of current activity level, no follow-up stress testing is required at this point prior to surgery.  I would like to update an echocardiogram however to reevaluate LVEF and ensure no significant change since remote evaluation.  Unless he has shown deterioration in LVEF that requires further work-up, he should be able to proceed as planned with surgery at an acceptable perioperative cardiac risk, generally low range as  detailed above.  2.  Essential hypertension, blood pressure is well controlled today.  He continues on Lotensin HCT.  3.  Mixed hyperlipidemia, on Pravachol with follow-up by PCP.  4.  Type 2 diabetes mellitus by history, follows with PCP.  5.  Psoriatic arthritis, on methotrexate and Humira.  Medication Adjustments/Labs and Tests Ordered: Current medicines are reviewed at length with the patient today.  Concerns regarding medicines are outlined above.   Tests Ordered: Orders Placed This Encounter  Procedures  . EKG 12-Lead  . ECHOCARDIOGRAM COMPLETE    Medication Changes: No orders of the defined types were placed in this encounter.   Disposition:  Follow up test results  Signed, Jonelle Sidle, MD, [facility] 12/13/2018 3:24 PM    [facility] at [facility] [address] [place], [place], [place] [address] Phone: [phone]; Fax: [phone]

## 2018-12-29 ENCOUNTER — Other Ambulatory Visit: Payer: Self-pay

## 2018-12-29 ENCOUNTER — Telehealth: Payer: Self-pay | Admitting: *Deleted

## 2018-12-29 ENCOUNTER — Ambulatory Visit (INDEPENDENT_AMBULATORY_CARE_PROVIDER_SITE_OTHER): Payer: BC Managed Care – PPO

## 2018-12-29 DIAGNOSIS — Z01810 Encounter for preprocedural cardiovascular examination: Secondary | ICD-10-CM | POA: Diagnosis not present

## 2018-12-29 DIAGNOSIS — I25119 Atherosclerotic heart disease of native coronary artery with unspecified angina pectoris: Secondary | ICD-10-CM | POA: Diagnosis not present

## 2018-12-29 NOTE — Telephone Encounter (Signed)
-----   Message from Satira Sark, MD sent at 12/29/2018  4:16 PM EDT ----- Results reviewed.  LVEF remains normal at 55% with mild hypokinesis and previous infarct distribution.  No further cardiac testing is anticipated as reviewed in the recent consultation note.  Patient should be able to proceed with knee replacement as scheduled with Dr. Mardelle Matte.

## 2018-12-29 NOTE — Telephone Encounter (Signed)
Patient informed. Copy sent to PCP and [place].

## 2019-01-02 NOTE — Progress Notes (Signed)
Office Visit Note  Patient: Brandon Frazier             Date of Birth: May 16, 1956           MRN: [id number]             PCP: Ignatius Specking, MD Referring: Ignatius Specking, MD Visit Date: 01/16/2019 Occupation: @GUAROCC @  Subjective:  Trochanteric bursitis bilaterally   History of Present Illness: Brandon Frazier is a 63 y.o. male with history of psoriatic arthritis.  He is currently on Humira 40 mg sq injections every 14 days and MTX 0.8 ml sq once weekly.  He denies any joint swelling.  He denies any achilles tendonitis or plantar fasciitis.  He has no active psoriasis.  He is scheduled for a right knee partial replacement in January 2021.  His left partial replacement is causing some discomfort.  He is having increased lower back pain and trochanteric bursitis bilaterally.    Activities of Daily Living:  Patient reports joint stiffness all day  Patient Denies nocturnal pain.  Difficulty dressing/grooming: Denies Difficulty climbing stairs: Denies Difficulty getting out of chair: Denies Difficulty using hands for taps, buttons, cutlery, and/or writing: Denies  Review of Systems  Constitutional: Positive for fatigue. Negative for night sweats.  HENT: Negative for mouth sores, mouth dryness and nose dryness.   Eyes: Negative for photophobia, redness, visual disturbance and dryness.  Respiratory: Negative for cough, hemoptysis, shortness of breath and difficulty breathing.   Cardiovascular: Negative for chest pain, palpitations, hypertension, irregular heartbeat and swelling in legs/feet.  Gastrointestinal: Negative for blood in stool, constipation and diarrhea.  Endocrine: Negative for increased urination.  Genitourinary: Negative for painful urination.  Musculoskeletal: Positive for arthralgias, joint pain and morning stiffness. Negative for joint swelling, myalgias, muscle weakness, muscle tenderness and myalgias.  Skin: Negative for color change, rash, hair loss, nodules/bumps, skin  tightness, ulcers and sensitivity to sunlight.  Allergic/Immunologic: Negative for susceptible to infections.  Neurological: Negative for dizziness, fainting, memory loss, night sweats and weakness.  Hematological: Negative for swollen glands.  Psychiatric/Behavioral: Negative for depressed mood and sleep disturbance. The patient is not nervous/anxious.     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   Psoriasis 12/10/2010   Knee pain, bilateral 10/30/2010   Hip pain, bilateral 10/30/2010   Leg length discrepancy 10/30/2010    Past Medical History:  Diagnosis Date   Anterior myocardial infarction (HCC) 1995   Anxiety    CAD (coronary artery disease)    Angioplasty of LAD 1995   Essential hypertension    GERD (gastroesophageal reflux disease)    Psoriatic arthritis (HCC)    Type 2 diabetes mellitus (HCC)     Family History  Problem Relation Age of Onset   GER disease Mother    Heart disease Mother    Liver cancer Father    Past Surgical History:  Procedure Laterality Date   CARDIAC CATHETERIZATION     1995, 2001   COLONOSCOPY     EYE SURGERY Bilateral    lasik   INSERTION OF MESH N/A 04/23/2016  Procedure: INSERTION OF MESH;  Surgeon: Ovidio Kin, MD;  Location: [facility];  Service: General;  Laterality: N/A;   KNEE ARTHROPLASTY     left knee arthroscopy     1990, 1997   PARTIAL KNEE ARTHROPLASTY Left 02/16/2014   Procedure: LEFT KNEE UNI ARTHROPLASTY;  Surgeon: Eulas Post, MD;  Location: [facility];  Service:  Orthopedics;  Laterality: Left;   UMBILICAL HERNIA REPAIR N/A 04/23/2016   Procedure: LAPAROSCOPIC UMBILICAL HERNIA;  Surgeon: Ovidio Kin, MD;  Location: [facility] OR;  Service: General;  Laterality: N/A;   Social History   Social History Narrative   Not on file   Immunization History  Administered Date(s) Administered   Influenza,inj,Quad PF,6+ Mos 03/19/2017, 03/24/2018   Tdap 05/14/2018     Objective: Vital Signs: BP 139/77 (BP Location: Left Arm, Patient Position: Sitting, Cuff Size: Normal)    Pulse (!) 58    Resp 14    Ht 5\' 8"  (1.727 m)    Wt 200 lb 3.2 oz (90.8 kg)    BMI 30.44 kg/m    Physical Exam Vitals signs and nursing note reviewed.  Constitutional:      Appearance: He is well-developed.  HENT:     Head: Normocephalic and atraumatic.  Eyes:     Conjunctiva/sclera: Conjunctivae normal.     Pupils: Pupils are equal, round, and reactive to light.  Neck:     Musculoskeletal: Normal range of motion and neck supple.  Cardiovascular:     Rate and Rhythm: Normal rate and regular rhythm.     Heart sounds: Normal heart sounds.  Pulmonary:     Effort: Pulmonary effort is normal.     Breath sounds: Normal breath sounds.  Abdominal:     General: Bowel sounds are normal.     Palpations: Abdomen is soft.  Skin:    General: Skin is warm and dry.     Capillary Refill: Capillary refill takes less than 2 seconds.  Neurological:     Mental Status: He is alert and oriented to person, place, and time.  Psychiatric:        Behavior: Behavior normal.      Musculoskeletal Exam: C-spine, thoracic spine, and lumbar spine good ROM.  Shoulder joints, elbow joints, wrist joints, MCPs, PIPs, and DIPs good ROM with no synovitis.  PIP and DIP synovial thickening consistent with osteoarthritis.  Hip joints good ROM with no discomfort.  Tenderness over trochanteric bursa. Limited extension of right knee.  Good ROM of left partial knee replacement.  No warmth or effusion of knee joints.     CDAI Exam: CDAI Score: -- Patient Global: --; Provider Global: -- Swollen: --; Tender: -- Joint Exam   No joint exam has been documented for this visit   There is currently no information documented on the homunculus. Go to the Rheumatology activity and complete the homunculus joint exam.  Investigation: No additional findings.  Imaging: Xr Lumbar Spine 2-3 Views  Result Date: 01/16/2019 Mild degenerative dextro scoliosis was noted.  Anterior posterior spurring was noted.  Mild facet joint arthropathy was noted.  No significant disc space narrowing was noted. Impression: Mild degenerative dextroscoliosis and mild anterior spurring with facet joint arthropathy was noted.  Xr Pelvis 1-2 Views  Result Date: 01/16/2019 No SI joint to sclerosis or narrowing was noted. Impression: Unremarkable x-ray of the SI joints.   Recent Labs: Lab Results  Component Value Date   WBC 5.4 12/02/2018   HGB 17.1 12/02/2018  PLT 250 12/02/2018   NA 138 12/02/2018   K 4.5 12/02/2018   CL 101 12/02/2018   CO2 28 12/02/2018   GLUCOSE 137 (H) 12/02/2018   BUN 17 12/02/2018   CREATININE 0.86 12/02/2018   BILITOT 0.8 12/02/2018   AST 30 12/02/2018   ALT 48 (H) 12/02/2018   PROT 6.9 12/02/2018   CALCIUM 9.7 12/02/2018   GFRAA 105 12/02/2018   QFTBGOLDPLUS NEGATIVE 08/15/2018    Speciality Comments: No specialty comments available.  Procedures:  Large Joint Inj: bilateral greater trochanter on 01/16/2019 8:34 AM Indications: pain Details: 27 G 1.5 in needle, lateral approach  Arthrogram: No  Medications (Right): 1.5 mL lidocaine 1 %; 40 mg triamcinolone acetonide 40 MG/ML Aspirate (Right): 0 mL Medications (Left): 1.5 mL lidocaine 1 %; 40 mg triamcinolone acetonide 40 MG/ML Aspirate (Left): 0 mL Outcome: tolerated well, no immediate complications Procedure, treatment alternatives, risks and benefits explained, specific risks discussed. Consent was given by the patient. Immediately  prior to procedure a time out was called to verify the correct patient, procedure, equipment, support staff and site/side marked as required. Patient was prepped and draped in the usual sterile fashion.     Allergies: Aspirin and Codeine     Assessment / Plan:     Visit Diagnoses: Psoriatic arthropathy (HCC): He has no synovitis or dactylitis on exam.  He is clinically doing well on Humira 40 mg sq injections every 14 days, MTX 0.8 ml sq once weekly, and folic acid 2 mg po daily.  He has not missed any doses recently.  He has no achilles tendonitis or plantar fasciitis.  He has no active psoriasis.  He presents today with increased lower back pain and stiffness. X-rays of the SI joints were unremarkable.  He has tenderness over bilateral trochanteric bursa, and he had cortisone injections performed today. He will continue on the current treatment regimen.  He does not need any refills at this time.  He was advised to notify us if he develops increased joint pain or joint swelling.  He will follow up in 5 months.     Psoriasis: He has no active psoriasis at this time.   High risk medication use - Humira every 14 days, methotrexate 0.8 mL weekly, folic acid 1 mg 2 tablets daily.  CBC and CMP were drawn on 12/02/18.  He is due to update lab work in October and every 3 months. Standing orders are in place.   Last TB gold negative on 08/15/2018 and will monitor yearly.    Status post left partial knee replacement - Dr. Chrisandra Netters well.  Good ROM with no discomfort.  No warmth or effusion of knee joints.   Chronic midline low back pain without sciatica -He presents today with increased lower back.  He has midline spinal tenderness in the lumbar region.  He has no symptoms of sciatica.  X-rays of the lumbar spine were obtained today. Mild degenerative dextroscoliosis and mild anterior spurring with facet joint arthropathy was noted. Plan: XR Lumbar Spine 2-3 Views  Chronic SI joint pain -He is having  increased lower back pain and stiffness.  He has tenderness over both SI joints.  X-rays of the pelvis were obtained today, which were unremarkable. Plan: XR Pelvis 1-2 Views  Trochanteric bursitis of both hips: He has tenderness over bilateral trochanteric bursa.  He requested bilateral trochanteric bursa cortisone injections.  He tolerated the procedure well.  He was given stretching exercises to perform.    Other medical  conditions are listed as follows:   History of coronary artery disease - Followed up by cardiology. He is taking Pravastatin 40 mg po daily.    History of hyperlipidemia  History of diabetes mellitus  History of hypertension  Orders: Orders Placed This Encounter  Procedures   Large Joint Inj   XR Lumbar Spine 2-3 Views   XR Pelvis 1-2 Views   No orders of the defined types were placed in this encounter.   Face-to-face time spent with patient was 30 minutes. Greater than 50% of time was spent in counseling and coordination of care.  Follow-Up Instructions: Return in about 5 months (around 06/18/2019) for Psoriatic arthritis.   Gearldine Bienenstock, PA-C   I examined and evaluated the patient with Sherron Ales PA.  Patient had no synovitis on my examination today.  He continues to have some discomfort in the lumbar region, SI joints and trochanteric bursitis.  His psoriatic arthritis appears to be well controlled.  He continues to have some discomfort in the lumbar region.  Handout was given for the exercises.  The plan of care was discussed as noted above.  Pollyann Savoy, MD  Note - This record has been created using Animal nutritionist.  Chart creation errors have been sought, but may not always  have been located. Such creation errors do not reflect on  the standard of medical care.

## 2019-01-16 ENCOUNTER — Ambulatory Visit: Payer: Self-pay

## 2019-01-16 ENCOUNTER — Encounter: Payer: Self-pay | Admitting: Physician Assistant

## 2019-01-16 ENCOUNTER — Ambulatory Visit: Payer: BC Managed Care – PPO | Admitting: Rheumatology

## 2019-01-16 ENCOUNTER — Other Ambulatory Visit: Payer: Self-pay

## 2019-01-16 VITALS — BP 139/77 | HR 58 | Resp 14 | Ht 68.0 in | Wt 200.2 lb

## 2019-01-16 DIAGNOSIS — L409 Psoriasis, unspecified: Secondary | ICD-10-CM | POA: Diagnosis not present

## 2019-01-16 DIAGNOSIS — G8929 Other chronic pain: Secondary | ICD-10-CM

## 2019-01-16 DIAGNOSIS — M545 Low back pain, unspecified: Secondary | ICD-10-CM

## 2019-01-16 DIAGNOSIS — M533 Sacrococcygeal disorders, not elsewhere classified: Secondary | ICD-10-CM

## 2019-01-16 DIAGNOSIS — Z79899 Other long term (current) drug therapy: Secondary | ICD-10-CM

## 2019-01-16 DIAGNOSIS — Z8679 Personal history of other diseases of the circulatory system: Secondary | ICD-10-CM

## 2019-01-16 DIAGNOSIS — Z8639 Personal history of other endocrine, nutritional and metabolic disease: Secondary | ICD-10-CM

## 2019-01-16 DIAGNOSIS — L4050 Arthropathic psoriasis, unspecified: Secondary | ICD-10-CM

## 2019-01-16 DIAGNOSIS — Z96652 Presence of left artificial knee joint: Secondary | ICD-10-CM

## 2019-01-16 DIAGNOSIS — M7062 Trochanteric bursitis, left hip: Secondary | ICD-10-CM | POA: Diagnosis not present

## 2019-01-16 DIAGNOSIS — M7061 Trochanteric bursitis, right hip: Secondary | ICD-10-CM | POA: Diagnosis not present

## 2019-01-16 MED ORDER — LIDOCAINE HCL 1 % IJ SOLN
1.5000 mL | INTRAMUSCULAR | Status: AC | PRN
  Administered 2019-01-16: 1.5 mL

## 2019-01-16 MED ORDER — TRIAMCINOLONE ACETONIDE 40 MG/ML IJ SUSP
40.0000 mg | INTRAMUSCULAR | Status: AC | PRN
  Administered 2019-01-16: 40 mg via INTRA_ARTICULAR

## 2019-01-16 NOTE — Patient Instructions (Addendum)
Standing Labs We placed an order today for your standing lab work.    Please come back and get your standing labs in October and every 3 months   We have open lab daily Monday through Thursday from 8:30-12:30 PM and 1:30-4:30 PM and Friday from 8:30-12:30 PM and 1:30 -4:00 PM at the office of Dr. Bo Merino.   You may experience shorter wait times on Monday and Friday afternoons. The office is located at [address] [place], [place], [place] [address] No appointment is necessary.   Labs are drawn by [facility].  You may receive a bill from [facility] for your lab work.  If you wish to have your labs drawn at another location, please call the office 24 hours in advance to send orders.  If you have any questions regarding directions or hours of operation,  please call [phone].   Just as a reminder please drink plenty of water prior to coming for your lab work. Thanks!   Iliotibial Bursitis Rehab Ask your health care provider which exercises are safe for you. Do exercises exactly as told by your health care provider and adjust them as directed. It is normal to feel mild stretching, pulling, tightness, or discomfort as you do these exercises. Stop right away if you feel sudden pain or your pain gets worse. Do not begin these exercises until told by your health care provider. Stretching and range-of-motion exercises These exercises warm up your muscles and joints and improve the movement and flexibility of your leg. These exercises also help to relieve pain and stiffness. Quadriceps stretch, prone  1. Lie on your abdomen (prone position) on a firm surface, such as a bed or padded floor. 2. Bend your left / right knee and reach back to hold your ankle or pant leg. If you cannot reach your ankle or pant leg, loop a belt around your foot and grab the belt instead. 3. Gently pull your heel toward your buttocks. Your knee should not slide out to the side. You should feel a stretch in the  front of your thigh and knee (quadriceps). 4. Hold this position for __________ seconds. Repeat __________ times. Complete this exercise __________ times a day. Lunge This exercise stretches the muscle in the inner thigh (adductor). 1. Stand and spread your legs about 3 ft (1 m) apart. Put your left / right leg slightly back for balance. 2. Lean away from your left / right leg by bending your other knee and shifting your weight toward your bent knee. You may rest your hands on your thigh for balance. You should feel a stretch in your left / right inner thigh. 3. Hold this position for __________ seconds. Repeat __________ times. Complete this exercise __________ times a day. Hamstring stretch, supine  1. Lie on your back (supine position). 2. Loop a belt or towel over the ball of your left / right foot. The ball of your foot is on the walking surface, right under your toes. 3. Straighten your left / right knee and slowly pull on the belt or towel to raise your leg. Stop when you feel a gentle stretch in the back of your left / right knee or thigh (hamstrings). ? Do not let your left / right knee bend. ? Keep your other leg flat on the floor. 4. Hold this position for __________ seconds. Repeat __________ times. Complete this exercise __________ times a day. Strengthening exercises These exercises build strength and endurance in your leg. Endurance is the  ability to use your muscles for a long time, even after they get tired. Wall slides This exercise strengthens the muscles in the front of your thigh and knee (quadriceps). 1. Lean your back against a smooth wall or door, and walk your feet out 18-24 inches (46-61 cm) from it. 2. Place your feet hip-width apart. 3. Slowly slide down the wall or door until your knees bend as far as told by your health care provider. Keep your knees over your heels, not your toes. Keep your knees in line with your hips. 4. Hold this position for __________  seconds. 5. Use the muscles in the front of your thigh to push yourself up to the standing position. 6. Rest for __________ seconds after each repetition. Repeat __________ times. Complete this exercise __________ times a day. Straight leg raises, side-lying This exercise is sometimes called a hip abductor exercise. It strengthens the muscles that rotate the leg at the hip and move it away from your body (hip abductors). 1. Lie on your side with your left / right leg in the top position. Lie so your head, shoulder, hip, and knee line up. Bend your bottom knee slightly to help you balance. 2. Lift your top leg 4-6 inches (10-15 cm) while keeping your toes pointed straight ahead. 3. Hold this position for __________ seconds. 4. Slowly lower your leg to the starting position. 5. Let your muscles relax completely after each repetition. Repeat __________ times. Complete this exercise __________ times a day. Straight leg raises, prone This exercise strengthens the muscles that move the hips (hip extensors). 1. Lie on your abdomen (prone position) on a firm surface. You can put a pillow under your hips if that is more comfortable for your lower back. 2. Squeeze your buttocks muscles and lift your left / right leg about 4-6 inches (10-15 cm). Keep your knee straight as you lift your leg. 3. Hold this position for __________ seconds. 4. Slowly lower your leg to the starting position. 5. Let your muscles relax completely after each repetition. Repeat __________ times. Complete this exercise __________ times a day. Bridge This exercise strengthens the muscles that move the hips (hip extensors). 1. Lie on your back on a firm surface with your knees bent and your feet flat on the floor. 2. Tighten your buttocks muscles and lift your bottom off the floor until the trunk of your body is level with your thighs. ? Do not arch your back. ? You should feel the muscles working in your buttocks and the back of  your thighs. If you do not feel these muscles, slide your feet 1-2 inches (2.5-5 cm) farther away from your buttocks. 3. Hold this position for __________ seconds. 4. Slowly lower your hips to the starting position. 5. Let your muscles relax completely after each repetition. 6. If this exercise is too easy, try doing it with your arms crossed over your chest. Repeat __________ times. Complete this exercise __________ times a day. This information is not intended to replace advice given to you by your health care provider. Make sure you discuss any questions you have with your health care provider. Document Released: 05/11/2005 Document Revised: 09/01/2018 Document Reviewed: 07/18/2018 Elsevier Patient Education  2020 [employer].  Hip Bursitis Rehab Ask your health care provider which exercises are safe for you. Do exercises exactly as told by your health care provider and adjust them as directed. It is normal to feel mild stretching, pulling, tightness, or discomfort as you do these  exercises. Stop right away if you feel sudden pain or your pain gets worse. Do not begin these exercises until told by your health care provider. Stretching exercise This exercise warms up your muscles and joints and improves the movement and flexibility of your hip. This exercise also helps to relieve pain and stiffness. Iliotibial band stretch An iliotibial band is a strong band of muscle tissue that runs from the outer side of your hip to the outer side of your thigh and knee. 1. Lie on your side with your left / right leg in the top position. 2. Bend your left / right knee and grab your ankle. Stretch out your bottom arm to help you balance. 3. Slowly bring your knee back so your thigh is behind your body. 4. Slowly lower your knee toward the floor until you feel a gentle stretch on the outside of your left / right thigh. If you do not feel a stretch and your knee will not fall farther, place the heel of your  other foot on top of your knee and pull your knee down toward the floor with your foot. 5. Hold this position for __________ seconds. 6. Slowly return to the starting position. Repeat __________ times. Complete this exercise __________ times a day. Strengthening exercises These exercises build strength and endurance in your hip and pelvis. Endurance is the ability to use your muscles for a long time, even after they get tired. Bridge This exercise strengthens the muscles that move your thigh backward (hip extensors). 1. Lie on your back on a firm surface with your knees bent and your feet flat on the floor. 2. Tighten your buttocks muscles and lift your buttocks off the floor until your trunk is level with your thighs. ? Do not arch your back. ? You should feel the muscles working in your buttocks and the back of your thighs. If you do not feel these muscles, slide your feet 1-2 inches (2.5-5 cm) farther away from your buttocks. ? If this exercise is too easy, try doing it with your arms crossed over your chest. 3. Hold this position for __________ seconds. 4. Slowly lower your hips to the starting position. 5. Let your muscles relax completely after each repetition. Repeat __________ times. Complete this exercise __________ times a day. Squats This exercise strengthens the muscles in front of your thigh and knee (quadriceps). 1. Stand in front of a table, with your feet and knees pointing straight ahead. You may rest your hands on the table for balance but not for support. 2. Slowly bend your knees and lower your hips like you are going to sit in a chair. ? Keep your weight over your heels, not over your toes. ? Keep your lower legs upright so they are parallel with the table legs. ? Do not let your hips go lower than your knees. ? Do not bend lower than told by your health care provider. ? If your hip pain increases, do not bend as low. 3. Hold the squat position for __________  seconds. 4. Slowly push with your legs to return to standing. Do not use your hands to pull yourself to standing. Repeat __________ times. Complete this exercise __________ times a day. Hip hike 1. Stand sideways on a bottom step. Stand on your left / right leg with your other foot unsupported next to the step. You can hold on to the railing or wall for balance if needed. 2. Keep your knees straight and your torso square.  Then lift your left / right hip up toward the ceiling. 3. Hold this position for __________ seconds. 4. Slowly let your left / right hip lower toward the floor, past the starting position. Your foot should get closer to the floor. Do not lean or bend your knees. Repeat __________ times. Complete this exercise __________ times a day. Single leg stand 1. Without shoes, stand near a railing or in a doorway. You may hold on to the railing or door frame as needed for balance. 2. Squeeze your left / right buttock muscles, then lift up your other foot. ? Do not let your left / right hip push out to the side. ? It is helpful to stand in front of a mirror for this exercise so you can watch your hip. 3. Hold this position for __________ seconds. Repeat __________ times. Complete this exercise __________ times a day. This information is not intended to replace advice given to you by your health care provider. Make sure you discuss any questions you have with your health care provider. Document Released: 06/18/2004 Document Revised: 09/05/2018 Document Reviewed: 09/05/2018 Elsevier Patient Education  [employer].  Back Exercises The following exercises strengthen the muscles that help to support the trunk and back. They also help to keep the lower back flexible. Doing these exercises can help to prevent back pain or lessen existing pain.  If you have back pain or discomfort, try doing these exercises 2-3 times each day or as told by your health care provider.  As your pain  improves, do them once each day, but increase the number of times that you repeat the steps for each exercise (do more repetitions).  To prevent the recurrence of back pain, continue to do these exercises once each day or as told by your health care provider. Do exercises exactly as told by your health care provider and adjust them as directed. It is normal to feel mild stretching, pulling, tightness, or discomfort as you do these exercises, but you should stop right away if you feel sudden pain or your pain gets worse. Exercises Single knee to chest Repeat these steps 3-5 times for each leg: 1. Lie on your back on a firm bed or the floor with your legs extended. 2. Bring one knee to your chest. Your other leg should stay extended and in contact with the floor. 3. Hold your knee in place by grabbing your knee or thigh with both hands and hold. 4. Pull on your knee until you feel a gentle stretch in your lower back or buttocks. 5. Hold the stretch for 10-30 seconds. 6. Slowly release and straighten your leg. Pelvic tilt Repeat these steps 5-10 times: 1. Lie on your back on a firm bed or the floor with your legs extended. 2. Bend your knees so they are pointing toward the ceiling and your feet are flat on the floor. 3. Tighten your lower abdominal muscles to press your lower back against the floor. This motion will tilt your pelvis so your tailbone points up toward the ceiling instead of pointing to your feet or the floor. 4. With gentle tension and even breathing, hold this position for 5-10 seconds. Cat-cow Repeat these steps until your lower back becomes more flexible: 1. Get into a hands-and-knees position on a firm surface. Keep your hands under your shoulders, and keep your knees under your hips. You may place padding under your knees for comfort. 2. Let your head hang down toward your chest. Contract  your abdominal muscles and point your tailbone toward the floor so your lower back becomes  rounded like the back of a cat. 3. Hold this position for 5 seconds. 4. Slowly lift your head, let your abdominal muscles relax and point your tailbone up toward the ceiling so your back forms a sagging arch like the back of a cow. 5. Hold this position for 5 seconds.  Press-ups Repeat these steps 5-10 times: 1. Lie on your abdomen (face-down) on the floor. 2. Place your palms near your head, about shoulder-width apart. 3. Keeping your back as relaxed as possible and keeping your hips on the floor, slowly straighten your arms to raise the top half of your body and lift your shoulders. Do not use your back muscles to raise your upper torso. You may adjust the placement of your hands to make yourself more comfortable. 4. Hold this position for 5 seconds while you keep your back relaxed. 5. Slowly return to lying flat on the floor.  Bridges Repeat these steps 10 times: 1. Lie on your back on a firm surface. 2. Bend your knees so they are pointing toward the ceiling and your feet are flat on the floor. Your arms should be flat at your sides, next to your body. 3. Tighten your buttocks muscles and lift your buttocks off the floor until your waist is at almost the same height as your knees. You should feel the muscles working in your buttocks and the back of your thighs. If you do not feel these muscles, slide your feet 1-2 inches farther away from your buttocks. 4. Hold this position for 3-5 seconds. 5. Slowly lower your hips to the starting position, and allow your buttocks muscles to relax completely. If this exercise is too easy, try doing it with your arms crossed over your chest. Abdominal crunches Repeat these steps 5-10 times: 1. Lie on your back on a firm bed or the floor with your legs extended. 2. Bend your knees so they are pointing toward the ceiling and your feet are flat on the floor. 3. Cross your arms over your chest. 4. Tip your chin slightly toward your chest without bending  your neck. 5. Tighten your abdominal muscles and slowly raise your trunk (torso) high enough to lift your shoulder blades a tiny bit off the floor. Avoid raising your torso higher than that because it can put too much stress on your low back and does not help to strengthen your abdominal muscles. 6. Slowly return to your starting position. Back lifts Repeat these steps 5-10 times: 1. Lie on your abdomen (face-down) with your arms at your sides, and rest your forehead on the floor. 2. Tighten the muscles in your legs and your buttocks. 3. Slowly lift your chest off the floor while you keep your hips pressed to the floor. Keep the back of your head in line with the curve in your back. Your eyes should be looking at the floor. 4. Hold this position for 3-5 seconds. 5. Slowly return to your starting position. Contact a health care provider if:  Your back pain or discomfort gets much worse when you do an exercise.  Your worsening back pain or discomfort does not lessen within 2 hours after you exercise. If you have any of these problems, stop doing these exercises right away. Do not do them again unless your health care provider says that you can. Get help right away if:  You develop sudden, severe back pain. If this  happens, stop doing the exercises right away. Do not do them again unless your health care provider says that you can. This information is not intended to replace advice given to you by your health care provider. Make sure you discuss any questions you have with your health care provider. Document Released: 06/18/2004 Document Revised: 09/15/2018 Document Reviewed: 02/10/2018 Elsevier Patient Education  2020 [employer].

## 2019-01-19 ENCOUNTER — Telehealth: Payer: Self-pay | Admitting: Pharmacist

## 2019-01-19 DIAGNOSIS — L4050 Arthropathic psoriasis, unspecified: Secondary | ICD-10-CM

## 2019-01-19 NOTE — Telephone Encounter (Signed)
Submitted a Prior Authorization request to [facility] for [place] via Phone. Will update once we receive a response.  RepLenna Sciara Phone# [phone]  9:35 AM Beatriz Chancellor, CPhT

## 2019-01-19 NOTE — Telephone Encounter (Signed)
Received notification from [facility] regarding a prior authorization for [place]. Authorization has been APPROVED from 01/19/2019 to 01/19/2020.   Will send document to scan center.  PA # [place] [phone] MS

## 2019-01-19 NOTE — Telephone Encounter (Signed)
Received  Prior authorization request from [facility] for Humira.  His current PA expires on 02/03/2019.  Please submit through covermymeds.  Thank you.  Mariella Saa, PharmD, [place], [facility] Pharmacist [phone]  01/19/2019 8:36 AM

## 2019-02-03 ENCOUNTER — Other Ambulatory Visit: Payer: Self-pay | Admitting: *Deleted

## 2019-02-03 MED ORDER — FOLIC ACID 1 MG PO TABS: 2.0000 mg | ORAL_TABLET | Freq: Every day | ORAL | 3 refills | Status: DC

## 2019-02-03 NOTE — Telephone Encounter (Signed)
Refill request received via fax  Last Visit: 01/16/19 Next Visit: 06/19/19  Okay to refill per Dr. Estanislado Pandy

## 2019-03-09 ENCOUNTER — Other Ambulatory Visit: Payer: Self-pay | Admitting: Rheumatology

## 2019-03-09 ENCOUNTER — Telehealth: Payer: Self-pay | Admitting: *Deleted

## 2019-03-09 NOTE — Telephone Encounter (Addendum)
Last Visit: 01/16/19 Next Visit: 06/19/19 Labs: 12/02/18 glucose is elevated-137. ALT is borderline elevated-48. CBC WNL TB Gold: 08/15/18 Neg   Patient tested positive for COVID on 03/02/19. Patient advised he will need to wait to come to our office for labs until the middle of November. [facility] weeks. Patient advised to hold medications.   Okay to refill 30 day supply per Dr. Estanislado Pandy

## 2019-03-09 NOTE — Telephone Encounter (Signed)
Patient advised he should hold methotrexate and Humira for 4 weeks and resume medications only if he is asymptomatic. Patient verbalized understanding.

## 2019-03-09 NOTE — Telephone Encounter (Signed)
He should hold methotrexate and Humira for 4 weeks and resume medications only if he is asymptomatic.

## 2019-03-09 NOTE — Telephone Encounter (Signed)
Patient tested positive for COVID on 03/02/19. Patient advised he will need to wait to come to our office for labs until the middle of November. [facility] in a [facility] of weeks. Patient advised to continue to hold MTX and Humira. How long do you want patient to hold medications?

## 2019-04-03 ENCOUNTER — Telehealth: Payer: Self-pay | Admitting: Rheumatology

## 2019-04-03 DIAGNOSIS — Z79899 Other long term (current) drug therapy: Secondary | ICD-10-CM

## 2019-04-03 NOTE — Telephone Encounter (Signed)
Patient called stating he had a positive COVID test on 03/02/19.  Patient states he talked to you about stopping his Methotrexate and Humira which he did.  Patient states he began taking the medications last weekend 04/02/19 and is requesting a return call regarding having labwork to refill the prescriptions.  Patient is not sure if he can come to the office or if he should have [facility] in [place].

## 2019-04-04 NOTE — Telephone Encounter (Signed)
Patient advised he can have his labs done at the [facility] in [place]. Patient advised orders have been released.

## 2019-04-06 LAB — CBC WITH DIFFERENTIAL/PLATELET
Absolute Monocytes: 459 cells/uL (ref 200–950)
Basophils Absolute: 37 cells/uL (ref 0–200)
Basophils Relative: 0.6 %
Eosinophils Absolute: 118 cells/uL (ref 15–500)
Eosinophils Relative: 1.9 %
HCT: 49 % (ref 38.5–50.0)
Hemoglobin: 16.7 g/dL (ref 13.2–17.1)
Lymphs Abs: 3398 cells/uL (ref 850–3900)
MCH: 32.9 pg (ref 27.0–33.0)
MCHC: 34.1 g/dL (ref 32.0–36.0)
MCV: 96.5 fL (ref 80.0–100.0)
MPV: 10.4 fL (ref 7.5–12.5)
Monocytes Relative: 7.4 %
Neutro Abs: 2189 cells/uL (ref 1500–7800)
Neutrophils Relative %: 35.3 %
Platelets: 241 10*3/uL (ref 140–400)
RBC: 5.08 10*6/uL (ref 4.20–5.80)
RDW: 12.2 % (ref 11.0–15.0)
Total Lymphocyte: 54.8 %
WBC: 6.2 10*3/uL (ref 3.8–10.8)

## 2019-04-06 LAB — COMPLETE METABOLIC PANEL WITH GFR
AG Ratio: 1.8 (calc) (ref 1.0–2.5)
ALT: 44 U/L (ref 9–46)
AST: 26 U/L (ref 10–35)
Albumin: 4.4 g/dL (ref 3.6–5.1)
Alkaline phosphatase (APISO): 47 U/L (ref 35–144)
BUN: 18 mg/dL (ref 7–25)
CO2: 28 mmol/L (ref 20–32)
Calcium: 9.6 mg/dL (ref 8.6–10.3)
Chloride: 99 mmol/L (ref 98–110)
Creat: 0.82 mg/dL (ref 0.70–1.25)
GFR, Est African American: 108 mL/min/{1.73_m2} (ref >=60)
GFR, Est Non African American: 93 mL/min/{1.73_m2} (ref >=60)
Globulin: 2.4 g/dL (calc) (ref 1.9–3.7)
Glucose, Bld: 184 mg/dL — ABNORMAL HIGH (ref 65–139)
Potassium: 4 mmol/L (ref 3.5–5.3)
Sodium: 136 mmol/L (ref 135–146)
Total Bilirubin: 0.9 mg/dL (ref 0.2–1.2)
Total Protein: 6.8 g/dL (ref 6.1–8.1)

## 2019-04-11 ENCOUNTER — Other Ambulatory Visit: Payer: Self-pay | Admitting: Rheumatology

## 2019-04-11 NOTE — Telephone Encounter (Signed)
Last Visit: 01/16/19 Next Visit: 06/19/19 Labs: 04/05/19 Glucose is elevated-184. Rest of CMP WNL. CBC WNL.  Okay to refill per Dr. Estanislado Pandy

## 2019-05-05 ENCOUNTER — Telehealth: Payer: Self-pay | Admitting: Rheumatology

## 2019-05-05 MED ORDER — PREDNISONE 5 MG PO TABS: ORAL_TABLET | ORAL | 0 refills | Status: DC

## 2019-05-05 NOTE — Telephone Encounter (Signed)
Patient states he is having bilateral hand pain and "a little" swelling. Patient states he has been taking MTX and Humira on schedule and has not missed any doses. Patient states he will stop both humira and MTX 2 weeks prior to surgery on 06/01/2019. Patient is requesting a small prednisone taper. Please advise.

## 2019-05-05 NOTE — Telephone Encounter (Signed)
Okay to give prednisone taper starting at 20 mg and taper by 5 mg every 2 days.

## 2019-05-05 NOTE — Telephone Encounter (Signed)
Prescription sent to the pharmacy, patient is aware.  

## 2019-05-05 NOTE — Telephone Encounter (Signed)
Patient calling with pain in both hands this week. Pain across hands into fingers. Hands tight, and sore. Patient is scheduled for partial knee replacement on 06/01/2019. [facility] [place]

## 2019-05-18 ENCOUNTER — Other Ambulatory Visit: Payer: Self-pay | Admitting: Rheumatology

## 2019-05-22 NOTE — Telephone Encounter (Signed)
Last Visit: 01/16/2019 Next Visit: 06/19/2019 Labs: 04/05/2019 Glucose is elevated-184. Rest of CMP WNL. CBC WNL. TB Gold:  08/15/2018 negative   Okay to refill per Dr. Estanislado Pandy.

## 2019-05-26 HISTORY — PX: MEDIAL PARTIAL KNEE REPLACEMENT: SHX5965

## 2019-06-08 ENCOUNTER — Other Ambulatory Visit: Payer: Self-pay | Admitting: Rheumatology

## 2019-06-19 ENCOUNTER — Ambulatory Visit: Payer: BC Managed Care – PPO | Admitting: Rheumatology

## 2019-07-12 NOTE — Progress Notes (Signed)
Office Visit Note  Patient: Brandon Frazier             Date of Birth: 1956/03/31           MRN: [id number]             PCP: Ignatius Specking, MD Referring: Ignatius Specking, MD Visit Date: 07/19/2019 Occupation: @GUAROCC @  Subjective:  Right knee pain   History of Present Illness: Brandon Frazier is a 64 y.o. male with history of psoriatic arthritis.  He reports he had a partial right knee replacement performed by Dr. Dion Saucier on 06/01/19.  He held Humira 2 weeks prior to surgery and for 2 weeks after and MTX was held for 1 week before and 1 week after surgery.  He has resumed both medications.  He continues to have pain, stiffness, and warmth of the right knee.  He has been experiencing severe nocturnal pain.  He denies any other joint pain or joint swelling at this time.  He denies any achilles tendonitis or plantar fasciitis.  He denies any SI joint pain.     Activities of Daily Living:  Patient reports morning stiffness for a few minutes.   Patient Reports nocturnal pain.  Difficulty dressing/grooming: Denies Difficulty climbing stairs: Reports Difficulty getting out of chair: Reports Difficulty using hands for taps, buttons, cutlery, and/or writing: Denies  Review of Systems  Constitutional: Positive for fatigue. Negative for night sweats.  HENT: Negative for mouth sores, mouth dryness and nose dryness.   Eyes: Negative for redness, itching and dryness.  Respiratory: Negative for cough, hemoptysis, shortness of breath and difficulty breathing.   Cardiovascular: Negative for chest pain, palpitations, hypertension, irregular heartbeat and swelling in legs/feet.  Gastrointestinal: Positive for constipation. Negative for blood in stool and diarrhea.  Endocrine: Negative for increased urination.  Genitourinary: Negative for difficulty urinating and painful urination.  Musculoskeletal: Positive for arthralgias, joint pain, joint swelling and morning stiffness. Negative for myalgias, muscle  weakness, muscle tenderness and myalgias.  Skin: Negative for color change, rash, hair loss, nodules/bumps, skin tightness, ulcers and sensitivity to sunlight.  Allergic/Immunologic: Negative for susceptible to infections.  Neurological: Negative for dizziness, fainting, headaches, memory loss, night sweats and weakness.  Hematological: Negative for bruising/bleeding tendency and swollen glands.  Psychiatric/Behavioral: Negative for depressed mood, confusion and sleep disturbance. The patient is not nervous/anxious.     PMFS History:  Patient Active Problem List   Diagnosis Date Noted  . Psoriatic arthropathy (HCC) 06/05/2016  . History of hypertension 06/05/2016  . History of diabetes mellitus 06/05/2016  . History of coronary artery disease 06/05/2016  . History of hyperlipidemia 06/05/2016  . High risk medication use 06/05/2016  . Status post left partial knee replacement 06/05/2016  . Primary localized osteoarthrosis, lower leg 02/16/2014  . 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 Psoriasis 12/10/2010  . Knee pain, bilateral 10/30/2010  . Hip pain, bilateral 10/30/2010  . Leg length discrepancy 10/30/2010    Past Medical History:  Diagnosis Date  . Anterior myocardial infarction (HCC) 1995  . Anxiety   . CAD (coronary artery disease)    Angioplasty of LAD 1995  . Essential hypertension   . GERD (gastroesophageal reflux disease)   . Psoriatic arthritis (HCC)   . Type 2 diabetes mellitus (HCC)     Family History  Problem Relation Age of Onset  . GER disease Mother   . Heart disease Mother   . Liver cancer Father   . Arthritis Son   .  Psoriasis Son   . Psoriasis Daughter    Past Surgical History:  Procedure Laterality Date  . CARDIAC CATHETERIZATION     1995, 2001  . COLONOSCOPY    . EYE SURGERY Bilateral    lasik  . INSERTION OF MESH N/A 04/23/2016   Procedure: INSERTION OF MESH;  Surgeon: Ovidio Kin, MD;  Location[facility] OR;  Service: General;  Laterality: N/A;  . KNEE  ARTHROPLASTY    . left knee arthroscopy     1990, 1997  . MEDIAL PARTIAL KNEE REPLACEMENT Right 05/2019  . PARTIAL KNEE ARTHROPLASTY Left 02/16/2014   Procedure: LEFT KNEE UNI ARTHROPLASTY;  Surgeon: Eulas Post, MD;  Location: [facility];  Service: Orthopedics;  Laterality: Left;  . UMBILICAL HERNIA REPAIR N/A 04/23/2016   Procedure: LAPAROSCOPIC UMBILICAL HERNIA;  Surgeon: Ovidio Kin, MD;  Location: [facility] OR;  Service: General;  Laterality: N/A;   Social History   Social History Narrative  . Not on file   Immunization History  Administered Date(s) Administered  . Influenza,inj,Quad PF,6+ Mos 03/19/2017, 03/24/2018  . Tdap 05/14/2018     Objective: Vital Signs: BP 138/87 (BP Location: Left Arm, Patient Position: Sitting, Cuff Size: Normal)   Pulse 75   Resp 14   Ht 5\' 8"  (1.727 m)   Wt 194 lb 6.4 oz (88.2 kg)   BMI 29.56 kg/m    Physical Exam Vitals and nursing note reviewed.  Constitutional:      Appearance: He is well-developed.  HENT:     Head: Normocephalic and atraumatic.  Eyes:     Conjunctiva/sclera: Conjunctivae normal.     Pupils: Pupils are equal, round, and reactive to light.  Pulmonary:     Effort: Pulmonary effort is normal.  Abdominal:     General: Bowel sounds are normal.     Palpations: Abdomen is soft.  Musculoskeletal:     Cervical back: Normal range of motion and neck supple.  Skin:    General: Skin is warm and dry.     Capillary Refill: Capillary refill takes less than 2 seconds.  Neurological:     Mental Status: He is alert and oriented to person, place, and time.  Psychiatric:        Behavior: Behavior normal.      Musculoskeletal Exam: C-spine, thoracic spine, and lumbar spine good ROM.  No midline spinal tenderness.  No SI joint tenderness. Shoulder joints, elbow joints, wrist joints, MCPs, PIPs, and DIPs good ROM with no synovitis.  DIP synovial thickening.  Hip joints good ROM with no discomfort bilaterally.  Right  knee partial replacement has limited extension and warmth noted.  Left knee partial replacement has full ROM with no discomfort.  No warmth or effusion of the left knee.  Ankle joints good ROM with no tenderness or synovitis.    CDAI Exam: CDAI Score: -- Patient Global: --; Provider Global: -- Swollen: --; Tender: -- Joint Exam 07/19/2019   No joint exam has been documented for this visit   There is currently no information documented on the homunculus. Go to the Rheumatology activity and complete the homunculus joint exam.  Investigation: No additional findings.  Imaging: No results found.  Recent Labs: Lab Results  Component Value Date   WBC 5.9 07/14/2019   HGB 15.9 07/14/2019   PLT 251 07/14/2019   NA 137 07/14/2019   K 3.8 07/14/2019   CL 100 07/14/2019   CO2 27 07/14/2019   GLUCOSE 236 (H) 07/14/2019   BUN  14 07/14/2019   CREATININE 0.80 07/14/2019   BILITOT 0.6 07/14/2019   AST 22 07/14/2019   ALT 46 07/14/2019   PROT 7.0 07/14/2019   CALCIUM 9.8 07/14/2019   GFRAA 109 07/14/2019   QFTBGOLDPLUS NEGATIVE 08/15/2018    Speciality Comments: No specialty comments available.  Procedures:  No procedures performed Allergies: Aspirin and Codeine   Assessment / Plan:     Visit Diagnoses: Psoriatic arthropathy (HCC): He has no synovitis or dactylitis on exam.  He has not had any recent psoriatic arthritis flares.  He is clinically doing well on Humira 40 mg subcutaneous injections every 14 days, methotrexate 0.8 mL sq once weekly, and folic acid 2 mg by mouth daily.  The patient had a right partial knee replacement performed by Dr. Dion Saucier on 06/01/2019.  He continues to have pain and inflammation in the right knee.  He has limited extension on exam today.  He held Humira 2 weeks prior methotrexate 1 week prior to surgery and resumed Humira 2 weeks postoperatively and methotrexate 1 week postoperatively.  He has not had any Achilles tendinitis, plantar fasciitis, or SI joint  pain.  No psoriasis at this time.  He will continue on the current treatment regimen.  He was advised to notify us if he develops increased joint pain or joint swelling.  He will follow-up in the office in 5 months.  Psoriasis: He has no active psoriasis at this time.  High risk medication use - Humira 40 mg sq every 14 days, methotrexate 0.8 mL weekly, folic acid 1 mg 2 tablets daily.  CBC and CMP were drawn on 07/14/2019.  He will be due for lab work in May and every 3 months to monitor for drug toxicity.  TB gold was negative on 08/15/2018.  A future order for TB gold will be placed today.  Status post left partial knee replacement - Dr. Dion Saucier.  Doing well.  Good range of motion with no discomfort.  No warmth or effusion noted  Status post right partial knee replacement - 06/01/19-Performed by Dr. Pola Corn continues to have pain and stiffness in the right knee.  He has limited extension on exam.  He has been experiencing severe nocturnal pain.  He has been trying to work on range of motion exercises.  Chronic SI joint pain: He has no SI joint tenderness on exam today.  Chronic midline low back pain without sciatica - Mild degenerative dextroscoliosis and mild anterior spurring with facet joint arthropathy was noted.  He has good ROM of the lumbar spine.  No midline spinal tenderness.  No SI Joint tenderness.  No symptoms of sciatica at this time.   Other medical conditions are listed as follows:   History of coronary artery disease - Followed up by cardiology. He is taking Pravastatin 40 mg po daily.   Trochanteric bursitis of both hips  History of hyperlipidemia  History of hypertension  History of diabetes mellitus  Orders: No orders of the defined types were placed in this encounter.  No orders of the defined types were placed in this encounter.     Follow-Up Instructions: Return in about 5 months (around 12/16/2019) for Psoriatic arthritis.   Gearldine Bienenstock, PA-C  Note -  This record has been created using Dragon software.  Chart creation errors have been sought, but may not always  have been located. Such creation errors do not reflect on  the standard of medical care.

## 2019-07-14 ENCOUNTER — Other Ambulatory Visit: Payer: Self-pay

## 2019-07-14 DIAGNOSIS — Z79899 Other long term (current) drug therapy: Secondary | ICD-10-CM

## 2019-07-15 LAB — CBC WITH DIFFERENTIAL/PLATELET
Absolute Monocytes: 484 cells/uL (ref 200–950)
Basophils Absolute: 41 cells/uL (ref 0–200)
Basophils Relative: 0.7 %
Eosinophils Absolute: 112 cells/uL (ref 15–500)
Eosinophils Relative: 1.9 %
HCT: 46.8 % (ref 38.5–50.0)
Hemoglobin: 15.9 g/dL (ref 13.2–17.1)
Lymphs Abs: 2767 cells/uL (ref 850–3900)
MCH: 31.9 pg (ref 27.0–33.0)
MCHC: 34 g/dL (ref 32.0–36.0)
MCV: 93.8 fL (ref 80.0–100.0)
MPV: 9.9 fL (ref 7.5–12.5)
Monocytes Relative: 8.2 %
Neutro Abs: 2496 cells/uL (ref 1500–7800)
Neutrophils Relative %: 42.3 %
Platelets: 251 10*3/uL (ref 140–400)
RBC: 4.99 10*6/uL (ref 4.20–5.80)
RDW: 13 % (ref 11.0–15.0)
Total Lymphocyte: 46.9 %
WBC: 5.9 10*3/uL (ref 3.8–10.8)

## 2019-07-15 LAB — COMPLETE METABOLIC PANEL WITH GFR
AG Ratio: 1.6 (calc) (ref 1.0–2.5)
ALT: 46 U/L (ref 9–46)
AST: 22 U/L (ref 10–35)
Albumin: 4.3 g/dL (ref 3.6–5.1)
Alkaline phosphatase (APISO): 53 U/L (ref 35–144)
BUN: 14 mg/dL (ref 7–25)
CO2: 27 mmol/L (ref 20–32)
Calcium: 9.8 mg/dL (ref 8.6–10.3)
Chloride: 100 mmol/L (ref 98–110)
Creat: 0.8 mg/dL (ref 0.70–1.25)
GFR, Est African American: 109 mL/min/{1.73_m2} (ref >=60)
GFR, Est Non African American: 94 mL/min/{1.73_m2} (ref >=60)
Globulin: 2.7 g/dL (calc) (ref 1.9–3.7)
Glucose, Bld: 236 mg/dL — ABNORMAL HIGH (ref 65–99)
Potassium: 3.8 mmol/L (ref 3.5–5.3)
Sodium: 137 mmol/L (ref 135–146)
Total Bilirubin: 0.6 mg/dL (ref 0.2–1.2)
Total Protein: 7 g/dL (ref 6.1–8.1)

## 2019-07-17 ENCOUNTER — Other Ambulatory Visit: Payer: Self-pay | Admitting: Rheumatology

## 2019-07-17 NOTE — Telephone Encounter (Signed)
Last Visit: 01/16/2019 Next Visit: 07/19/2019 Labs: 07/14/2019 Glucose is very elevated-236 Rest of CMP WNL. CBC WNL.   Okay to refill per Dr. Estanislado Pandy

## 2019-07-19 ENCOUNTER — Other Ambulatory Visit: Payer: Self-pay

## 2019-07-19 ENCOUNTER — Ambulatory Visit: Payer: BC Managed Care – PPO | Admitting: Physician Assistant

## 2019-07-19 ENCOUNTER — Encounter: Payer: Self-pay | Admitting: Physician Assistant

## 2019-07-19 VITALS — BP 138/87 | HR 75 | Resp 14 | Ht 68.0 in | Wt 194.4 lb

## 2019-07-19 DIAGNOSIS — L409 Psoriasis, unspecified: Secondary | ICD-10-CM

## 2019-07-19 DIAGNOSIS — M7062 Trochanteric bursitis, left hip: Secondary | ICD-10-CM

## 2019-07-19 DIAGNOSIS — Z79899 Other long term (current) drug therapy: Secondary | ICD-10-CM | POA: Diagnosis not present

## 2019-07-19 DIAGNOSIS — Z8639 Personal history of other endocrine, nutritional and metabolic disease: Secondary | ICD-10-CM

## 2019-07-19 DIAGNOSIS — M533 Sacrococcygeal disorders, not elsewhere classified: Secondary | ICD-10-CM

## 2019-07-19 DIAGNOSIS — Z96652 Presence of left artificial knee joint: Secondary | ICD-10-CM

## 2019-07-19 DIAGNOSIS — Z96651 Presence of right artificial knee joint: Secondary | ICD-10-CM

## 2019-07-19 DIAGNOSIS — L4050 Arthropathic psoriasis, unspecified: Secondary | ICD-10-CM | POA: Diagnosis not present

## 2019-07-19 DIAGNOSIS — G8929 Other chronic pain: Secondary | ICD-10-CM

## 2019-07-19 DIAGNOSIS — M7061 Trochanteric bursitis, right hip: Secondary | ICD-10-CM

## 2019-07-19 DIAGNOSIS — Z8679 Personal history of other diseases of the circulatory system: Secondary | ICD-10-CM

## 2019-07-19 DIAGNOSIS — M545 Low back pain, unspecified: Secondary | ICD-10-CM

## 2019-07-19 NOTE — Patient Instructions (Signed)
Standing Labs We placed an order today for your standing lab work.    Please come back and get your standing labs in May and every 3 months   We have open lab daily Monday through Thursday from 8:30-12:30 PM and 1:30-4:30 PM and Friday from 8:30-12:30 PM and 1:30-4:00 PM at the office of Dr. Shaili Deveshwar.   You may experience shorter wait times on Monday and Friday afternoons. The office is located at [address], [address], [place], [place] [address] No appointment is necessary.   Labs are drawn by [facility].  You may receive a bill from [facility] for your lab work.  If you wish to have your labs drawn at another location, please call the office 24 hours in advance to send orders.  If you have any questions regarding directions or hours of operation,  please call [phone].   Just as a reminder please drink plenty of water prior to coming for your lab work. Thanks!   

## 2019-08-22 ENCOUNTER — Ambulatory Visit: Payer: BC Managed Care – PPO | Admitting: Podiatry

## 2019-08-22 ENCOUNTER — Other Ambulatory Visit: Payer: Self-pay

## 2019-08-22 DIAGNOSIS — B351 Tinea unguium: Secondary | ICD-10-CM

## 2019-08-28 DIAGNOSIS — B351 Tinea unguium: Secondary | ICD-10-CM | POA: Insufficient documentation

## 2019-08-28 NOTE — Progress Notes (Signed)
Subjective: 64 year old male presents the office today for follow-up evaluation of nail fungus.  I last saw him in 2019 for same issue.  He was using a topical medicine that was getting better but the right third nail is still thickened discolored as well as the hallux toenail.  He has no pain in the nail no redness or drainage or any swelling. Denies any systemic complaints such as fevers, chills, nausea, vomiting. No acute changes since last appointment, and no other complaints at this time.   Objective: AAO x3, NAD DP/PT pulses palpable bilaterally, CRT less than 3 seconds Nails are hypertrophic, dystrophic with yellow-brown discoloration particular the right third as well as the hallux toenail.  There is no pain in the nails there is no redness or drainage or any signs of infection. No open lesions or pre-ulcerative lesions.  No pain with calf compression, swelling, warmth, erythema  Assessment: Onychomycosis  Plan: -All treatment options discussed with the patient including all alternatives, risks, complications.  -We discussed various treatment options for the nails.  Also discussed that the psoriasis could be a contributing factor he is on medication for this.  At this time the nails were also given start laser therapy as well I ordered a compound cream today through [facility]. -Patient encouraged to call the office with any questions, concerns, change in symptoms.   Trula Slade DPM

## 2019-09-11 ENCOUNTER — Ambulatory Visit (INDEPENDENT_AMBULATORY_CARE_PROVIDER_SITE_OTHER): Payer: BC Managed Care – PPO | Admitting: *Deleted

## 2019-09-11 ENCOUNTER — Other Ambulatory Visit: Payer: Self-pay

## 2019-09-11 DIAGNOSIS — B351 Tinea unguium: Secondary | ICD-10-CM

## 2019-09-11 NOTE — Progress Notes (Signed)
Patient presents today for the 1st laser treatment. Diagnosed with mycotic nail infection by Dr. Jacqualyn Posey.   Toenail most affected 1st and 3rd nails right, however Dr. Jacqualyn Posey would like all toenails treated.  All other systems are negative.  Nails were filed thin. Laser therapy was administered to 1-5 toenails bilateral and patient tolerated the treatment well. All safety precautions were in place.    Follow up in 4 weeks for laser # 2.  Picture of nails taken today to document visual progress

## 2019-09-11 NOTE — Patient Instructions (Signed)

## 2019-10-09 ENCOUNTER — Ambulatory Visit (INDEPENDENT_AMBULATORY_CARE_PROVIDER_SITE_OTHER): Payer: BC Managed Care – PPO | Admitting: *Deleted

## 2019-10-09 ENCOUNTER — Other Ambulatory Visit: Payer: Self-pay

## 2019-10-09 DIAGNOSIS — B351 Tinea unguium: Secondary | ICD-10-CM

## 2019-10-09 NOTE — Progress Notes (Signed)
Patient presents today for the 2nd laser treatment. Diagnosed with mycotic nail infection by Dr. Jacqualyn Posey.   Toenail most affected 1st and 3rd nails right, however Dr. Jacqualyn Posey would like all toenails treated. The hallux nail is starting to grow a little bit.  All other systems are negative.  Nails were filed thin. Laser therapy was administered to 1-5 toenails bilateral and patient tolerated the treatment well. All safety precautions were in place.    Follow up in 4 weeks for laser # 3.

## 2019-10-27 ENCOUNTER — Telehealth: Payer: Self-pay | Admitting: Rheumatology

## 2019-10-27 NOTE — Telephone Encounter (Signed)
Patient has been having trouble with both knees for about two weeks now. Patient has tried wearing knees sleeves without relief. If patient has been sitting for awhile, it takes him some time to get moving. Please call to advise.

## 2019-10-27 NOTE — Telephone Encounter (Signed)
Patient scheduled for 10/30/2019 at 8:40 am.

## 2019-10-27 NOTE — Telephone Encounter (Signed)
Please schedule a sooner office visit for further evaluation.  We will need to update x-rays of both knees and hands.

## 2019-10-27 NOTE — Telephone Encounter (Signed)
Patient states he is having bilateral knee pain. Patient states he noticed it about 4 weeks ago. Patient states he noticed this after sitting his knees are stiff. Patient states that they are good after he gets up moving around. Patient states he has been outside doing a lot of yard work as he has put in a pool this year. Patient states he has tried knee sleeves that gave some relief. Patient states his right hand is tender as well. Patient states it is between his thumb and index finger.  Patient states he is on Humira 40 mg every 14 days and MTX 0.8 mL weekly and denies missing any doses. Please advise.

## 2019-10-30 ENCOUNTER — Ambulatory Visit: Payer: BC Managed Care – PPO | Admitting: Physician Assistant

## 2019-10-30 NOTE — Progress Notes (Signed)
Office Visit Note  Patient: Brandon Frazier             Date of Birth: [birth year]           MRN: [id number]             PCP: Ignatius Specking, MD Referring: Ignatius Specking, MD Visit Date: 10/31/2019 Occupation: @GUAROCC @  Subjective:  Pain in both knees   History of Present Illness: Brandon Frazier is a 64 y.o. male with history of psoriatic arthritis.  He is injecting Humira 40 mg subcutaneous injections every 14 days, methotrexate 0.8 mL subcu injections once weekly and folic acid 2 mg by mouth daily.  He is experiencing increased pain in both hands and both knee joints.  Both knees were partially replaced in the past.  According to the patient he has been very active and has been performing yard work for several hours a day which she feels may have exacerbated some of his discomfort.  He states that he recently got a pool at his home and has been going to the pool more often to.  He is also been experiencing increased discomfort in bilateral SI joints.  He denies any symptoms of radiculopathy.  He states that his morning stiffness has been lasting longer up to 30 minutes daily.  He is not taking any over-the-counter products for pain relief.  He denies any active psoriasis.  He states that about 3 weeks ago he took prednisone 10 mg by mouth daily for 6 days.  He states that he had an old prescription which he took which seemed to help his pain and inflammation.     Activities of Daily Living:  Patient reports morning stiffness for 30 minutes.   Patient Denies nocturnal pain.  Difficulty dressing/grooming: Denies Difficulty climbing stairs: Denies Difficulty getting out of chair: Reports Difficulty using hands for taps, buttons, cutlery, and/or writing: Denies  Review of Systems  Constitutional: Negative for fatigue and night sweats.  HENT: Negative for mouth sores, mouth dryness and nose dryness.   Eyes: Negative for redness and dryness.  Respiratory: Negative for cough, hemoptysis,  shortness of breath and difficulty breathing.   Cardiovascular: Negative for chest pain, palpitations, hypertension, irregular heartbeat and swelling in legs/feet.  Gastrointestinal: Negative for blood in stool, constipation and diarrhea.  Endocrine: Negative for excessive thirst and increased urination.  Genitourinary: Negative for difficulty urinating and painful urination.  Musculoskeletal: Positive for arthralgias, joint pain, joint swelling, muscle weakness, morning stiffness and muscle tenderness. Negative for myalgias and myalgias.  Skin: Negative for color change, rash, hair loss, nodules/bumps, skin tightness, ulcers and sensitivity to sunlight.  Allergic/Immunologic: Negative for susceptible to infections.  Neurological: Negative for dizziness, fainting, numbness, memory loss, night sweats and weakness.  Hematological: Positive for bruising/bleeding tendency. Negative for swollen glands.  Psychiatric/Behavioral: Negative for depressed mood and sleep disturbance. The patient is not nervous/anxious.     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   Psoriasis 12/10/2010   Knee pain, bilateral 10/30/2010   Hip pain, bilateral 10/30/2010   Leg  length discrepancy 10/30/2010    Past Medical History:  Diagnosis Date   Anterior myocardial infarction [facility]) 1995   Anxiety    CAD (coronary artery disease)    Angioplasty of LAD 1995   Essential hypertension    GERD (gastroesophageal reflux disease)    Psoriatic arthritis (HCC)    Type  2 diabetes mellitus (HCC)     Family History  Problem Relation Age of Onset   GER disease Mother    Heart disease Mother    Liver cancer Father    Arthritis Son    Psoriasis Son    Psoriasis Daughter    Past Surgical History:  Procedure Laterality Date   CARDIAC CATHETERIZATION     1995, 2001   COLONOSCOPY     EYE SURGERY Bilateral    lasik   INSERTION OF MESH N/A 04/23/2016   Procedure: INSERTION OF MESH;  Surgeon: Ovidio Kin, MD;  Location: [facility] OR;  Service: General;  Laterality: N/A;   KNEE ARTHROPLASTY     left knee arthroscopy     1990, 1997   MEDIAL PARTIAL KNEE REPLACEMENT Right 05/2019   PARTIAL KNEE ARTHROPLASTY Left 02/16/2014   Procedure: LEFT KNEE UNI ARTHROPLASTY;  Surgeon: Eulas Post, MD;  Location: [facility];  Service: Orthopedics;  Laterality: Left;   UMBILICAL HERNIA REPAIR N/A 04/23/2016   Procedure: LAPAROSCOPIC UMBILICAL HERNIA;  Surgeon: Ovidio Kin, MD;  Location: [facility] OR;  Service: General;  Laterality: N/A;   Social History   Social History Narrative   Not on file   Immunization History  Administered Date(s) Administered   Influenza,inj,Quad PF,6+ Mos 03/19/2017, 03/24/2018   Tdap 05/14/2018     Objective: Vital Signs: BP (!) 110/59 (BP Location: Right Arm, Patient Position: Sitting, Cuff Size: Normal)    Pulse 70    Resp 16    Ht 5\' 8"  (1.727 m)    Wt 195 lb (88.5 kg)    BMI 29.65 kg/m    Physical Exam Vitals and nursing note reviewed.  Constitutional:      Appearance: He is well-developed.  HENT:     Head: Normocephalic and atraumatic.  Eyes:     Conjunctiva/sclera: Conjunctivae normal.     Pupils: Pupils are equal, round, and reactive to light.  Pulmonary:     Effort: Pulmonary effort is normal.  Abdominal:     General: Bowel sounds are normal.     Palpations: Abdomen is soft.  Musculoskeletal:     Cervical back: Normal range of motion and neck supple.  Skin:    General: Skin is warm and dry.      Capillary Refill: Capillary refill takes less than 2 seconds.  Neurological:     Mental Status: He is alert and oriented to person, place, and time.  Psychiatric:        Behavior: Behavior normal.      Musculoskeletal Exam: C-spine limited range of motion with lateral rotation.  Thoracic and lumbar spine good range of motion.  No midline spinal tenderness.  Tenderness over both SI joints.  Shoulder joints, elbow joints, wrist joints, MCPs, PIPs and DIPs good range of motion with no synovitis.  He has PIP and DIP thickening consistent with osteoarthritis of both hands.  He has tenderness of bilateral first MCP joints.  Hip joints have good range of motion with no discomfort.  Partial knee replacements have good range of motion bilaterally.  Warmth but no effusion was noted.  He has painful range of  motion of both knees.  Ankle joints have good range of motion with no tenderness or inflammation.  CDAI Exam: CDAI Score: -- Patient Global: --; Provider Global: -- Swollen: --; Tender: -- Joint Exam 10/31/2019   No joint exam has been documented for this visit   There is currently no information documented on the homunculus. Go to the Rheumatology activity and complete the homunculus joint exam.  Investigation: No additional findings.  Imaging: No results found.  Recent Labs: Lab Results  Component Value Date   WBC 5.9 07/14/2019   HGB 15.9 07/14/2019   PLT 251 07/14/2019   NA 137 07/14/2019   K 3.8 07/14/2019   CL 100 07/14/2019   CO2 27 07/14/2019   GLUCOSE 236 (H) 07/14/2019   BUN 14 07/14/2019   CREATININE 0.80 07/14/2019   BILITOT 0.6 07/14/2019   AST 22 07/14/2019   ALT 46 07/14/2019   PROT 7.0 07/14/2019   CALCIUM 9.8 07/14/2019   GFRAA 109 07/14/2019   QFTBGOLDPLUS NEGATIVE 08/15/2018    Speciality Comments: No specialty comments available.  Procedures:  No procedures performed Allergies: Aspirin and Codeine   Assessment / Plan:     Visit Diagnoses:  Psoriatic arthropathy (HCC) -He has no synovitis or dactylitis on exam.  He has been having increased discomfort in both hands and both knees which are partially replaced.  He has been significantly more active recently performing yard work for several hours per day as well as exercising in his new pool.  He has tenderness to palpation of bilateral first MCP joints.  He has synovial thickening of first MCP joints bilaterally but no synovitis was noted.  He is able to make a complete fist bilaterally.  Both partial knee replacements are warm but no effusion was noted on exam.  He has been experiencing increased stiffness in both knees after sitting for prolonged periods of time.  He is having discomfort in both SI joints.  He has tenderness to palpation bilaterally.  His discomfort is exacerbated by breaking and performing yard work on a daily basis.  His discomfort is likely due to underlying osteoarthritis since no inflammation was noted on exam today.  He has not had any psoriasis recently.  Overall he is clinically been doing well on Humira 40 mg subcutaneous injections every 14 days, methotrexate 0.8 mL once weekly and folic acid 2 mg by mouth daily. We will check a sed rate today to assess for inflammation.  If the sed rate is within normal limits no medication changes will be made.  If he does have inflammation  we will discuss treatment plans.  Plan: Sedimentation rate  Psoriasis: He has no active psoriasis currently.  High risk medication use - Humira 40 mg sq every 14 days, methotrexate 0.8 mL weekly, folic acid 1 mg 2 tablets daily.  CBC and CMP were drawn on 07/04/2019.  He is due to update lab work today.  Orders for CBC and CMP are released.  TB gold was negative on 08/15/2018.  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  He has warmth but no effusion on exam.  Has been experiencing significant stiffness when rising from a seated position.  Status post right partial knee replacement - He continues to have discomfort in the right knee which was  replaced on 06/01/2019 by Dr. Dion Saucier.  He has warmth but no effusion on exam.  He has had difficulty rising from a seated position due to the discomfort and stiffness in his right knee.  We will check a sed rate today.  Chronic SI joint pain: He has tenderness over bilateral SI joints.  He has been experiencing increased stiffness in his lower back first thing in the morning.  He has been raking and performing yard work on a daily basis which is exacerbated his lower back pain.  Chronic midline low back pain without sciatica - Mild degenerative dextroscoliosis and mild anterior spurring with facet joint arthropathy was noted.  He does not have any midline spinal tenderness at this time.  No symptoms of sciatica.  Trochanteric bursitis of both hips: Resolved.  He had cortisone injections bilaterally performed on 01/16/2019 which resolved his symptoms  Other medical conditions are listed as follows:  History of coronary artery disease - Followed up by cardiology. He is taking Pravastatin 40 mg po daily.   History of hypertension  History of hyperlipidemia  History of diabetes mellitus  Orders: Orders Placed This Encounter  Procedures   CBC with Differential/Platelet   COMPLETE METABOLIC PANEL WITH GFR   QuantiFERON-TB Gold Plus   Sedimentation rate   No orders of the defined types were placed in this encounter.    Follow-Up Instructions: Return in about 3 months (around 01/31/2020) for Psoriatic arthritis.   Gearldine Bienenstock, PA-C  Note - This record has been created using Dragon software.  Chart creation errors have been sought, but may not always  have been located. Such creation errors do not  reflect on  the standard of medical care.

## 2019-10-31 ENCOUNTER — Encounter: Payer: Self-pay | Admitting: Physician Assistant

## 2019-10-31 ENCOUNTER — Ambulatory Visit: Payer: BC Managed Care – PPO | Admitting: Physician Assistant

## 2019-10-31 ENCOUNTER — Other Ambulatory Visit: Payer: Self-pay

## 2019-10-31 VITALS — BP 110/59 | HR 70 | Resp 16 | Ht 68.0 in | Wt 195.0 lb

## 2019-10-31 DIAGNOSIS — M533 Sacrococcygeal disorders, not elsewhere classified: Secondary | ICD-10-CM

## 2019-10-31 DIAGNOSIS — L4050 Arthropathic psoriasis, unspecified: Secondary | ICD-10-CM | POA: Diagnosis not present

## 2019-10-31 DIAGNOSIS — Z79899 Other long term (current) drug therapy: Secondary | ICD-10-CM

## 2019-10-31 DIAGNOSIS — M7061 Trochanteric bursitis, right hip: Secondary | ICD-10-CM

## 2019-10-31 DIAGNOSIS — Z96652 Presence of left artificial knee joint: Secondary | ICD-10-CM

## 2019-10-31 DIAGNOSIS — L409 Psoriasis, unspecified: Secondary | ICD-10-CM | POA: Diagnosis not present

## 2019-10-31 DIAGNOSIS — M545 Low back pain, unspecified: Secondary | ICD-10-CM

## 2019-10-31 DIAGNOSIS — Z8679 Personal history of other diseases of the circulatory system: Secondary | ICD-10-CM

## 2019-10-31 DIAGNOSIS — Z96651 Presence of right artificial knee joint: Secondary | ICD-10-CM

## 2019-10-31 DIAGNOSIS — G8929 Other chronic pain: Secondary | ICD-10-CM

## 2019-10-31 DIAGNOSIS — M7062 Trochanteric bursitis, left hip: Secondary | ICD-10-CM

## 2019-10-31 DIAGNOSIS — Z8639 Personal history of other endocrine, nutritional and metabolic disease: Secondary | ICD-10-CM

## 2019-10-31 NOTE — Patient Instructions (Signed)
Hand Exercises Hand exercises can be helpful for almost anyone. These exercises can strengthen the hands, improve flexibility and movement, and increase blood flow to the hands. These results can make work and daily tasks easier. Hand exercises can be especially helpful for people who have joint pain from arthritis or have nerve damage from overuse (carpal tunnel syndrome). These exercises can also help people who have injured a hand. Exercises Most of these hand exercises are gentle stretching and motion exercises. It is usually safe to do them often throughout the day. Warming up your hands before exercise may help to reduce stiffness. You can do this with gentle massage or by placing your hands in warm water for 10-15 minutes. It is normal to feel some stretching, pulling, tightness, or mild discomfort as you begin new exercises. This will gradually improve. Stop an exercise right away if you feel sudden, severe pain or your pain gets worse. Ask your health care provider which exercises are best for you. Knuckle bend or "claw" fist 1. Stand or sit with your arm, hand, and all five fingers pointed straight up. Make sure to keep your wrist straight during the exercise. 2. Gently bend your fingers down toward your palm until the tips of your fingers are touching the top of your palm. Keep your big knuckle straight and just bend the small knuckles in your fingers. 3. Hold this position for __________ seconds. 4. Straighten (extend) your fingers back to the starting position. Repeat this exercise 5-10 times with each hand. Full finger fist 1. Stand or sit with your arm, hand, and all five fingers pointed straight up. Make sure to keep your wrist straight during the exercise. 2. Gently bend your fingers into your palm until the tips of your fingers are touching the middle of your palm. 3. Hold this position for __________ seconds. 4. Extend your fingers back to the starting position, stretching every  joint fully. Repeat this exercise 5-10 times with each hand. Straight fist 1. Stand or sit with your arm, hand, and all five fingers pointed straight up. Make sure to keep your wrist straight during the exercise. 2. Gently bend your fingers at the big knuckle, where your fingers meet your hand, and the middle knuckle. Keep the knuckle at the tips of your fingers straight and try to touch the bottom of your palm. 3. Hold this position for __________ seconds. 4. Extend your fingers back to the starting position, stretching every joint fully. Repeat this exercise 5-10 times with each hand. Tabletop 1. Stand or sit with your arm, hand, and all five fingers pointed straight up. Make sure to keep your wrist straight during the exercise. 2. Gently bend your fingers at the big knuckle, where your fingers meet your hand, as far down as you can while keeping the small knuckles in your fingers straight. Think of forming a tabletop with your fingers. 3. Hold this position for __________ seconds. 4. Extend your fingers back to the starting position, stretching every joint fully. Repeat this exercise 5-10 times with each hand. Finger spread 1. Place your hand flat on a table with your palm facing down. Make sure your wrist stays straight as you do this exercise. 2. Spread your fingers and thumb apart from each other as far as you can until you feel a gentle stretch. Hold this position for __________ seconds. 3. Bring your fingers and thumb tight together again. Hold this position for __________ seconds. Repeat this exercise 5-10 times with each hand.   Making circles 1. Stand or sit with your arm, hand, and all five fingers pointed straight up. Make sure to keep your wrist straight during the exercise. 2. Make a circle by touching the tip of your thumb to the tip of your index finger. 3. Hold for __________ seconds. Then open your hand wide. 4. Repeat this motion with your thumb and each finger on your  hand. Repeat this exercise 5-10 times with each hand. Thumb motion 1. Sit with your forearm resting on a table and your wrist straight. Your thumb should be facing up toward the ceiling. Keep your fingers relaxed as you move your thumb. 2. Lift your thumb up as high as you can toward the ceiling. Hold for __________ seconds. 3. Bend your thumb across your palm as far as you can, reaching the tip of your thumb for the small finger (pinkie) side of your palm. Hold for __________ seconds. Repeat this exercise 5-10 times with each hand. Grip strengthening  1. Hold a stress ball or other soft ball in the middle of your hand. 2. Slowly increase the pressure, squeezing the ball as much as you can without causing pain. Think of bringing the tips of your fingers into the middle of your palm. All of your finger joints should bend when doing this exercise. 3. Hold your squeeze for __________ seconds, then relax. Repeat this exercise 5-10 times with each hand. Contact a health care provider if:  Your hand pain or discomfort gets much worse when you do an exercise.  Your hand pain or discomfort does not improve within 2 hours after you exercise. If you have any of these problems, stop doing these exercises right away. Do not do them again unless your health care provider says that you can. Get help right away if:  You develop sudden, severe hand pain or swelling. If this happens, stop doing these exercises right away. Do not do them again unless your health care provider says that you can. This information is not intended to replace advice given to you by your health care provider. Make sure you discuss any questions you have with your health care provider. Document Revised: 09/01/2018 Document Reviewed: 05/12/2018 Elsevier Patient Education  2020 [employer]  Journal for Nurse Practitioners, 15(4), 263-267. Retrieved February 28, 2018 from http://clinicalkey.com/nursing">  Knee Exercises Ask your  health care provider which exercises are safe for you. Do exercises exactly as told by your health care provider and adjust them as directed. It is normal to feel mild stretching, pulling, tightness, or discomfort as you do these exercises. Stop right away if you feel sudden pain or your pain gets worse. Do not begin these exercises until told by your health care provider. Stretching and range-of-motion exercises These exercises warm up your muscles and joints and improve the movement and flexibility of your knee. These exercises also help to relieve pain and swelling. Knee extension, prone 1. Lie on your abdomen (prone position) on a bed. 2. Place your left / right knee just beyond the edge of the surface so your knee is not on the bed. You can put a towel under your left / right thigh just above your kneecap for comfort. 3. Relax your leg muscles and allow gravity to straighten your knee (extension). You should feel a stretch behind your left / right knee. 4. Hold this position for __________ seconds. 5. Scoot up so your knee is supported between repetitions. Repeat __________ times. Complete this exercise __________ times a day.   Knee flexion, active  1. Lie on your back with both legs straight. If this causes back discomfort, bend your left / right knee so your foot is flat on the floor. 2. Slowly slide your left / right heel back toward your buttocks. Stop when you feel a gentle stretch in the front of your knee or thigh (flexion). 3. Hold this position for __________ seconds. 4. Slowly slide your left / right heel back to the starting position. Repeat __________ times. Complete this exercise __________ times a day. Quadriceps stretch, prone  1. Lie on your abdomen on a firm surface, such as a bed or padded floor. 2. Bend your left / right knee and hold your ankle. If you cannot reach your ankle or pant leg, loop a belt around your foot and grab the belt instead. 3. Gently pull your heel  toward your buttocks. Your knee should not slide out to the side. You should feel a stretch in the front of your thigh and knee (quadriceps). 4. Hold this position for __________ seconds. Repeat __________ times. Complete this exercise __________ times a day. Hamstring, supine 1. Lie on your back (supine position). 2. Loop a belt or towel over the ball of your left / right foot. The ball of your foot is on the walking surface, right under your toes. 3. Straighten your left / right knee and slowly pull on the belt to raise your leg until you feel a gentle stretch behind your knee (hamstring). ? Do not let your knee bend while you do this. ? Keep your other leg flat on the floor. 4. Hold this position for __________ seconds. Repeat __________ times. Complete this exercise __________ times a day. Strengthening exercises These exercises build strength and endurance in your knee. Endurance is the ability to use your muscles for a long time, even after they get tired. Quadriceps, isometric This exercise stretches the muscles in front of your thigh (quadriceps) without moving your knee joint (isometric). 1. Lie on your back with your left / right leg extended and your other knee bent. Put a rolled towel or small pillow under your knee if told by your health care provider. 2. Slowly tense the muscles in the front of your left / right thigh. You should see your kneecap slide up toward your hip or see increased dimpling just above the knee. This motion will push the back of the knee toward the floor. 3. For __________ seconds, hold the muscle as tight as you can without increasing your pain. 4. Relax the muscles slowly and completely. Repeat __________ times. Complete this exercise __________ times a day. Straight leg raises This exercise stretches the muscles in front of your thigh (quadriceps) and the muscles that move your hips (hip flexors). 1. Lie on your back with your left / right leg extended and  your other knee bent. 2. Tense the muscles in the front of your left / right thigh. You should see your kneecap slide up or see increased dimpling just above the knee. Your thigh may even shake a bit. 3. Keep these muscles tight as you raise your leg 4-6 inches (10-15 cm) off the floor. Do not let your knee bend. 4. Hold this position for __________ seconds. 5. Keep these muscles tense as you lower your leg. 6. Relax your muscles slowly and completely after each repetition. Repeat __________ times. Complete this exercise __________ times a day. Hamstring, isometric 1. Lie on your back on a firm surface. 2. Bend your   left / right knee about __________ degrees. 3. Dig your left / right heel into the surface as if you are trying to pull it toward your buttocks. Tighten the muscles in the back of your thighs (hamstring) to "dig" as hard as you can without increasing any pain. 4. Hold this position for __________ seconds. 5. Release the tension gradually and allow your muscles to relax completely for __________ seconds after each repetition. Repeat __________ times. Complete this exercise __________ times a day. Hamstring curls If told by your health care provider, do this exercise while wearing ankle weights. Begin with __________ lb weights. Then increase the weight by 1 lb (0.5 kg) increments. Do not wear ankle weights that are more than __________ lb. 1. Lie on your abdomen with your legs straight. 2. Bend your left / right knee as far as you can without feeling pain. Keep your hips flat against the floor. 3. Hold this position for __________ seconds. 4. Slowly lower your leg to the starting position. Repeat __________ times. Complete this exercise __________ times a day. Squats This exercise strengthens the muscles in front of your thigh and knee (quadriceps). 1. Stand in front of a table, with your feet and knees pointing straight ahead. You may rest your hands on the table for balance but  not for support. 2. Slowly bend your knees and lower your hips like you are going to sit in a chair. ? Keep your weight over your heels, not over your toes. ? Keep your lower legs upright so they are parallel with the table legs. ? Do not let your hips go lower than your knees. ? Do not bend lower than told by your health care provider. ? If your knee pain increases, do not bend as low. 3. Hold the squat position for __________ seconds. 4. Slowly push with your legs to return to standing. Do not use your hands to pull yourself to standing. Repeat __________ times. Complete this exercise __________ times a day. Wall slides This exercise strengthens the muscles in front of your thigh and knee (quadriceps). 1. Lean your back against a smooth wall or door, and walk your feet out 18-24 inches (46-61 cm) from it. 2. Place your feet hip-width apart. 3. Slowly slide down the wall or door until your knees bend __________ degrees. Keep your knees over your heels, not over your toes. Keep your knees in line with your hips. 4. Hold this position for __________ seconds. Repeat __________ times. Complete this exercise __________ times a day. Straight leg raises This exercise strengthens the muscles that rotate the leg at the hip and move it away from your body (hip abductors). 1. Lie on your side with your left / right leg in the top position. Lie so your head, shoulder, knee, and hip line up. You may bend your bottom knee to help you keep your balance. 2. Roll your hips slightly forward so your hips are stacked directly over each other and your left / right knee is facing forward. 3. Leading with your heel, lift your top leg 4-6 inches (10-15 cm). You should feel the muscles in your outer hip lifting. ? Do not let your foot drift forward. ? Do not let your knee roll toward the ceiling. 4. Hold this position for __________ seconds. 5. Slowly return your leg to the starting position. 6. Let your muscles  relax completely after each repetition. Repeat __________ times. Complete this exercise __________ times a day. Straight leg raises This exercise   stretches the muscles that move your hips away from the front of the pelvis (hip extensors). 1. Lie on your abdomen on a firm surface. You can put a pillow under your hips if that is more comfortable. 2. Tense the muscles in your buttocks and lift your left / right leg about 4-6 inches (10-15 cm). Keep your knee straight as you lift your leg. 3. Hold this position for __________ seconds. 4. Slowly lower your leg to the starting position. 5. Let your leg relax completely after each repetition. Repeat __________ times. Complete this exercise __________ times a day. This information is not intended to replace advice given to you by your health care provider. Make sure you discuss any questions you have with your health care provider. Document Revised: 03/01/2018 Document Reviewed: 03/01/2018 Elsevier Patient Education  2020 Else[employer] Inc.  

## 2019-11-01 ENCOUNTER — Telehealth: Payer: Self-pay | Admitting: *Deleted

## 2019-11-01 DIAGNOSIS — Z79899 Other long term (current) drug therapy: Secondary | ICD-10-CM

## 2019-11-01 NOTE — Progress Notes (Signed)
LFTs are elevated.  Please ask if the patient is taking tylenol, NSAIDs, or drinking alcohol.  He should avoid the use of OTC products and alcohol.   Please advise the patient to return to recheck LFTS in 2 weeks.  If they remain elevated we will have to discuss reducing the dose of MTX.

## 2019-11-01 NOTE — Telephone Encounter (Signed)
-----   Message from Ofilia Neas, PA-C sent at 11/01/2019 12:30 PM EDT ----- LFTs are elevated.  Please ask if the patient is taking tylenol, NSAIDs, or drinking alcohol.  He should avoid the use of OTC products and alcohol.   Please advise the patient to return to recheck LFTS in 2 weeks.  If they remain elevated we will have to discuss reducing the dose of MTX.

## 2019-11-01 NOTE — Telephone Encounter (Signed)
Future order placed for LFTs.

## 2019-11-02 ENCOUNTER — Other Ambulatory Visit: Payer: Self-pay | Admitting: Rheumatology

## 2019-11-02 LAB — QUANTIFERON-TB GOLD PLUS
Mitogen-NIL: 9.75 IU/mL
NIL: 0.04 IU/mL
QuantiFERON-TB Gold Plus: NEGATIVE
TB1-NIL: 0 IU/mL
TB2-NIL: 0 IU/mL

## 2019-11-02 LAB — COMPLETE METABOLIC PANEL WITH GFR
AG Ratio: 1.6 (calc) (ref 1.0–2.5)
ALT: 50 U/L — ABNORMAL HIGH (ref 9–46)
AST: 36 U/L — ABNORMAL HIGH (ref 10–35)
Albumin: 4.3 g/dL (ref 3.6–5.1)
Alkaline phosphatase (APISO): 57 U/L (ref 35–144)
BUN: 16 mg/dL (ref 7–25)
CO2: 27 mmol/L (ref 20–32)
Calcium: 9 mg/dL (ref 8.6–10.3)
Chloride: 100 mmol/L (ref 98–110)
Creat: 1.01 mg/dL (ref 0.70–1.25)
GFR, Est African American: 90 mL/min/{1.73_m2} (ref >=60)
GFR, Est Non African American: 78 mL/min/{1.73_m2} (ref >=60)
Globulin: 2.7 g/dL (calc) (ref 1.9–3.7)
Glucose, Bld: 151 mg/dL — ABNORMAL HIGH (ref 65–99)
Potassium: 4 mmol/L (ref 3.5–5.3)
Sodium: 137 mmol/L (ref 135–146)
Total Bilirubin: 0.8 mg/dL (ref 0.2–1.2)
Total Protein: 7 g/dL (ref 6.1–8.1)

## 2019-11-02 LAB — CBC WITH DIFFERENTIAL/PLATELET
Absolute Monocytes: 515 cells/uL (ref 200–950)
Basophils Absolute: 28 cells/uL (ref 0–200)
Basophils Relative: 0.5 %
Eosinophils Absolute: 101 cells/uL (ref 15–500)
Eosinophils Relative: 1.8 %
HCT: 50.3 % — ABNORMAL HIGH (ref 38.5–50.0)
Hemoglobin: 16.8 g/dL (ref 13.2–17.1)
Lymphs Abs: 2430 cells/uL (ref 850–3900)
MCH: 31.5 pg (ref 27.0–33.0)
MCHC: 33.4 g/dL (ref 32.0–36.0)
MCV: 94.2 fL (ref 80.0–100.0)
MPV: 9.7 fL (ref 7.5–12.5)
Monocytes Relative: 9.2 %
Neutro Abs: 2526 cells/uL (ref 1500–7800)
Neutrophils Relative %: 45.1 %
Platelets: 270 10*3/uL (ref 140–400)
RBC: 5.34 10*6/uL (ref 4.20–5.80)
RDW: 13 % (ref 11.0–15.0)
Total Lymphocyte: 43.4 %
WBC: 5.6 10*3/uL (ref 3.8–10.8)

## 2019-11-02 LAB — SEDIMENTATION RATE: Sed Rate: 2 mm/h (ref 0–20)

## 2019-11-02 NOTE — Telephone Encounter (Signed)
Last Visit: 10/31/2019 Next Visit: 01/30/2020 Labs: 10/31/2019 LFTs are elevated. patient to return to recheck LFTS in 2 weeks. If they remain elevated we will have to discuss reducing the dose of MTX.  Current Dose per office note 10/31/2019:  methotrexate 0.8 mL once weekly   Okay to refill per Dr. Estanislado Pandy

## 2019-11-02 NOTE — Progress Notes (Signed)
TB gold negative

## 2019-11-14 ENCOUNTER — Other Ambulatory Visit: Payer: Self-pay | Admitting: Rheumatology

## 2019-11-14 NOTE — Telephone Encounter (Signed)
Last Visit: 10/31/2019 Next Visit: 01/30/2020 Labs: 10/31/2019 LFTs are elevated. patient to return to recheck LFTS in 2 weeks. If they remain elevated we will have to discuss reducing the dose of MTX. TB Gold: 10/25/2019 Neg   Current Dose per office note 10/31/2019: Humira 40 mg sq every 14 days  Okay to refill Humira?

## 2019-11-17 ENCOUNTER — Other Ambulatory Visit: Payer: Self-pay

## 2019-11-17 ENCOUNTER — Ambulatory Visit (INDEPENDENT_AMBULATORY_CARE_PROVIDER_SITE_OTHER): Payer: BC Managed Care – PPO | Admitting: *Deleted

## 2019-11-17 DIAGNOSIS — B351 Tinea unguium: Secondary | ICD-10-CM

## 2019-11-17 NOTE — Progress Notes (Signed)
Patient presents today for the 3rd laser treatment. Diagnosed with mycotic nail infection by Dr. Jacqualyn Posey.   Toenail most affected 1st and 3rd nails right, however Dr. Jacqualyn Posey would like all toenails treated. The two affected nails are really starting to improve.  All other systems are negative.  Nails were filed thin. Laser therapy was administered to 1-5 toenails bilateral and patient tolerated the treatment well. All safety precautions were in place.    Follow up in 6 weeks for laser # 4.

## 2019-11-28 ENCOUNTER — Telehealth: Payer: Self-pay | Admitting: Rheumatology

## 2019-11-28 NOTE — Telephone Encounter (Signed)
Patient called stating he will be retiring soon and has questions regarding his prescription of Humira and the cost with Medicare.  Patient requested a return call.

## 2019-11-28 NOTE — Telephone Encounter (Signed)
Please call patient. Thank you.  

## 2019-11-29 ENCOUNTER — Telehealth: Payer: Self-pay | Admitting: Rheumatology

## 2019-11-29 NOTE — Telephone Encounter (Signed)
Patient called requesting to speak with Amber regarding his prescription of Humira and how switching to Medicare will affect the cost.  Patient states he is currently enrolled in the patient assistance program.  Patient states he has questions regarding Medicare Part D and the assistance program.  Patient was told that Luetta Nutting will return his call tomorrow morning 11/30/19.

## 2019-11-30 NOTE — Telephone Encounter (Signed)
Returned patient call.  Patient states he will be retiring in August and is trying to decide on a Medicare plan.  He is trying to decide between Medicare advantage and traditional Medicare with supplement.  Advised I cannot provide any guidance on Medicare selection but traditionally our patients who are on Medicare will apply for patient assistance.  Reviewed patient assistance income eligibility requirements and patient states that it would be close.  Advised that he can also submit a letter of financial hardship and medical expenses report to provide a more accurate picture of financial need.  Patient verbalized understanding.  Recommend patient to reach out to [facility] for free unbiased Medicare counseling.  Patient given phone number to contact and determine local office.  Patient requested application along with example letters be emailed to him.  Documents were emailed to patient.  All questions encouraged and answered.  Patient will call the office when he has more information.  Mariella Saa, PharmD, [place], CPP Clinical Specialty Pharmacist (Rheumatology and Pulmonology)  11/30/2019 10:31 AM

## 2019-12-12 ENCOUNTER — Ambulatory Visit: Payer: BC Managed Care – PPO | Admitting: Physician Assistant

## 2020-01-01 ENCOUNTER — Ambulatory Visit (INDEPENDENT_AMBULATORY_CARE_PROVIDER_SITE_OTHER): Payer: BC Managed Care – PPO | Admitting: *Deleted

## 2020-01-01 ENCOUNTER — Other Ambulatory Visit: Payer: Self-pay

## 2020-01-01 DIAGNOSIS — B351 Tinea unguium: Secondary | ICD-10-CM

## 2020-01-01 NOTE — Progress Notes (Signed)
Patient presents today for the 4th laser treatment. Diagnosed with mycotic nail infection by Dr. Jacqualyn Posey.   Toenail most affected 1st and 3rd nails right, however Dr. Jacqualyn Posey would like all toenails treated. The two affected nails are looking much better.  All other systems are negative.  Nails were filed thin. Laser therapy was administered to 1-5 toenails bilateral and patient tolerated the treatment well. All safety precautions were in place.    Follow up in 6 weeks for laser # 5.

## 2020-01-04 ENCOUNTER — Telehealth: Payer: Self-pay | Admitting: Pharmacy Technician

## 2020-01-04 NOTE — Telephone Encounter (Signed)
Submitted a Prior Authorization request to [facility] for [place] via Fax. Will update once we receive a response.   PA# [id number]

## 2020-01-17 NOTE — Progress Notes (Signed)
Office Visit Note  Patient: Brandon Frazier             Date of Birth: [birth year]           MRN: [id number]             PCP: Ignatius Specking, MD Referring: Ignatius Specking, MD Visit Date: 01/30/2020 Occupation: @GUAROCC @  Subjective:  Medication monitoring   History of Present Illness: Brandon Frazier is a 64 y.o. male with history of psoriatic arthritis.  Patient is on Humira 40 mg sq injections every 14 days, methotrexate 0.8 mL of days injections once weekly, folic acid 2 mg by mouth daily.  He has not missed any doses recently.  He has not had any recent infections.  He has not received the COVID-19 vaccinations yet. He denies any recent psoriatic arthritis flares.  He denies any joint pain or joint swelling at this time.  He states that he is trying to remain active and has started to go to the gym again as well as using his pool on a regular basis.  He denies any active psoriasis at this time.  He has not had any Achilles tendinitis or plantar fasciitis.  His trochanteric bursitis has been under control recently.  He states that he was walking on an incline and started having some increased discomfort but by the end of the walk his discomfort has subsided.  He has been experiencing intermittent SI joint pain on the left side but denies any radiating pain.  Activities of Daily Living:  Patient reports morning stiffness for 10-15 minutes.   Patient Denies nocturnal pain.  Difficulty dressing/grooming: Denies Difficulty climbing stairs: Denies Difficulty getting out of chair: Denies Difficulty using hands for taps, buttons, cutlery, and/or writing: Denies  Review of Systems  Constitutional: Negative for fatigue.  HENT: Negative for mouth sores, mouth dryness and nose dryness.   Eyes: Negative for itching and dryness.  Respiratory: Negative for shortness of breath and difficulty breathing.   Cardiovascular: Negative for chest pain and palpitations.  Gastrointestinal: Negative for blood in  stool, constipation and diarrhea.  Endocrine: Negative for increased urination.  Genitourinary: Negative for difficulty urinating.  Musculoskeletal: Positive for morning stiffness. Negative for arthralgias, joint pain, joint swelling, myalgias, muscle tenderness and myalgias.  Skin: Negative for color change, rash and redness.  Allergic/Immunologic: Negative for susceptible to infections.  Neurological: Negative for dizziness, numbness, headaches, memory loss and weakness.  Hematological: Positive for bruising/bleeding tendency.  Psychiatric/Behavioral: Negative for confusion.    PMFS History:  Patient Active Problem List   Diagnosis Date Noted  . Onychomycosis 08/28/2019  . Psoriatic arthropathy (HCC) 06/05/2016  . History of hypertension 06/05/2016  . History of diabetes mellitus 06/05/2016  . History of coronary artery disease 06/05/2016  . History of hyperlipidemia 06/05/2016  . High risk medication use 06/05/2016  . Status post left partial knee replacement 06/05/2016  . Primary localized osteoarthrosis, lower leg 02/16/2014  . 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 Psoriasis 12/10/2010  . Knee pain, bilateral 10/30/2010  . Hip pain, bilateral 10/30/2010  . Leg length discrepancy 10/30/2010    Past Medical History:  Diagnosis Date  . Anterior myocardial infarction (HCC) 1995  . Anxiety   . CAD (coronary artery disease)    Angioplasty of LAD 1995  . Essential hypertension   . GERD (gastroesophageal reflux disease)   . Psoriatic arthritis (HCC)   . Type 2 diabetes mellitus (HCC)     Family History  Problem Relation Age of Onset  . GER disease Mother   . Heart disease Mother   . Liver cancer Father   . Arthritis Son   . Psoriasis Son   . Psoriasis Daughter    Past Surgical History:  Procedure Laterality Date  . CARDIAC CATHETERIZATION     1995, 2001  . COLONOSCOPY    . EYE SURGERY Bilateral    lasik  . INSERTION OF MESH N/A 04/23/2016   Procedure: INSERTION OF MESH;   Surgeon: Ovidio Kin, MD;  Location: [facility] OR;  Service: General;  Laterality: N/A;  . KNEE ARTHROPLASTY    . left knee arthroscopy     1990, 1997  . MEDIAL PARTIAL KNEE REPLACEMENT Right 05/2019  . PARTIAL KNEE ARTHROPLASTY Left 02/16/2014   Procedure: LEFT KNEE UNI ARTHROPLASTY;  Surgeon: Eulas Post, MD;  Location: [facility];  Service: Orthopedics;  Laterality: Left;  . UMBILICAL HERNIA REPAIR N/A 04/23/2016   Procedure: LAPAROSCOPIC UMBILICAL HERNIA;  Surgeon: Ovidio Kin, MD;  Location: [facility] OR;  Service: General;  Laterality: N/A;   Social History   Social History Narrative  . Not on file   Immunization History  Administered Date(s) Administered  . Influenza,inj,Quad PF,6+ Mos 03/19/2017, 03/24/2018  . Tdap 05/14/2018     Objective: Vital Signs: BP (!) 171/86 (BP Location: Left Arm, Patient Position: Sitting, Cuff Size: Normal)   Pulse (!) 53   Resp 15   Ht 5\' 8"  (1.727 m)   Wt 193 lb 6.4 oz (87.7 kg)   BMI 29.41 kg/m    Physical Exam Vitals and nursing note reviewed.  Constitutional:      Appearance: He is well-developed.  HENT:     Head: Normocephalic and atraumatic.  Eyes:     Conjunctiva/sclera: Conjunctivae normal.     Pupils: Pupils are equal, round, and reactive to light.  Pulmonary:     Effort: Pulmonary effort is normal.  Abdominal:     Palpations: Abdomen is soft.  Musculoskeletal:     Cervical back: Normal range of motion and neck supple.  Skin:    General: Skin is warm and dry.     Capillary Refill: Capillary refill takes less than 2 seconds.  Neurological:     Mental Status: He is alert and oriented to person, place, and time.  Psychiatric:        Behavior: Behavior normal.      Musculoskeletal Exam: C-spine, thoracic spine, and lumbar spine good ROM.  Shoulder joints, elbow joints, wrist joints, MCPs, PIPs, and DIPs good ROM with no synovitis.  Complete fist formation bilaterally.  DIP thickening.  Hip joints have good range  of motion with no discomfort.  Left knee replacement has good range of motion with no warmth or effusion.  Left knee replacement has slightly limited extension with warmth.  Ankle joints have good range of motion with no tenderness or inflammation.  No Achilles tendinitis.  No tenderness of MTP joints.  No tenderness palpation over the trochanteric bursa bilaterally.   CDAI Exam: CDAI Score: -- Patient Global: --; Provider Global: -- Swollen: 0 ; Tender: 1  Joint Exam 01/30/2020      Right  Left  Sacroiliac      Tender     Investigation: No additional findings.  Imaging: No results found.  Recent Labs: Lab Results  Component Value Date   WBC 5.6 10/31/2019   HGB 16.8 10/31/2019   PLT 270 10/31/2019   NA 137 10/31/2019  K 4.0 10/31/2019   CL 100 10/31/2019   CO2 27 10/31/2019   GLUCOSE 151 (H) 10/31/2019   BUN 16 10/31/2019   CREATININE 1.01 10/31/2019   BILITOT 0.8 10/31/2019   AST 36 (H) 10/31/2019   ALT 50 (H) 10/31/2019   PROT 7.0 10/31/2019   CALCIUM 9.0 10/31/2019   GFRAA 90 10/31/2019   QFTBGOLDPLUS NEGATIVE 10/31/2019    Speciality Comments: No specialty comments available.  Procedures:  No procedures performed Allergies: Aspirin and Codeine   Assessment / Plan:     Visit Diagnoses: Psoriatic arthropathy (HCC): He has no synovitis or dactylitis on exam.  He has not had any recent psoriatic arthritis flares.  He is clinically doing well on Humira 40 mg subcutaneous injections every 14 days, methotrexate 0.8 mL subcutaneous injections once weekly, and folic acid 2 mg by mouth daily.  He has not missed any doses of Humira or methotrexate recently.  He has not had any recent infections.  He is not experiencing any increased joint pain or inflammation at this time.  He has tenderness palpation over the left SI joint and was encouraged to perform stretching exercises on a daily basis.  He was advised to notify us if the discomfort persists or worsens.  He plans on  starting to go to the chiropractor once a month and continuing to have a massage twice a month.  He was encouraged to remain active exercising on a daily basis.  Association of heart disease with psoriatic arthritis was discussed. Need to monitor blood pressure, cholesterol, and to exercise 30-60 minutes on daily basis was discussed.   Psoriasis: He has no active psoriasis at this time.  High risk medication use - Humira 40 mg sq injections every 14 days, MTX 0.8 ml sq once weekly, folic acid 2 mg daily.  CBC and CMP were drawn on 10/31/2019.  LFTs were elevated at that time but the patient did not return to update lab work.  CBC and CMP will be drawn today to monitor for drug toxicity.  He will then return for lab work in December and every 3 months to monitor for drug toxicity.  TB gold was negative on 10/31/2019 and will continue to be monitored yearly.- Plan: CBC with Differential/Platelet, COMPLETE METABOLIC PANEL WITH GFR He was encouraged to have yearly skin exams while on Humira due to the increased risk of skin cancer. He was advised to hold Humira and methotrexate if he develops signs or symptoms of an infection and to resume once the infection has completely cleared.  He has not received the COVID-19 vaccination yet and is slightly apprehensive due to being on immunosuppressive therapy.  He plans on getting his antibodies checked since he had the COVID-19 infection in October 2020.  We discussed that ACR does recommend receiving the COVID-19 vaccination.  He was advised to hold methotrexate 1 week after each vaccine if he does proceed.  He was advised to notify us or his PCP if he develops a COVID-19 infection in order to receive the antibody infusion.  He voiced understanding.  Status post left partial knee replacement: Doing well.  He has good range of motion with no warmth or effusion on exam.  Status post right partial knee replacement: He has slightly limited extension with warmth.  Her  discomfort has been improving.  He has been exercising in the pool and has started going to the gym again.  Trochanteric bursitis of both hips: He experiences intermittent discomfort.  He was  advised to perform stretching exercises on a daily basis.  He has no tenderness palpation on exam today.  Other medical conditions are listed as follows:  History of hypertension  History of hyperlipidemia  History of coronary artery disease  History of diabetes mellitus  Orders: Orders Placed This Encounter  Procedures  . CBC with Differential/Platelet  . COMPLETE METABOLIC PANEL WITH GFR   Meds ordered this encounter  Medications  . Methotrexate Sodium (METHOTREXATE, PF,) 50 MG/2ML injection    Sig: INJECT 0.8 MLS (20 MG TOTAL) INTO THE SKIN ONCE A WEEK.    Dispense:  4 mL    Refill:  2     Follow-Up Instructions: Return in about 5 months (around 07/01/2020) for Psoriatic arthritis.   Gearldine Bienenstock, PA-C  Note - This record has been created using Dragon software.  Chart creation errors have been sought, but may not always  have been located. Such creation errors do not reflect on  the standard of medical care.

## 2020-01-26 ENCOUNTER — Other Ambulatory Visit: Payer: Self-pay | Admitting: Rheumatology

## 2020-01-26 NOTE — Telephone Encounter (Signed)
Last Visit:10/31/2019 Next Visit:01/30/2020  Okay to refill per Dr. Estanislado Pandy

## 2020-01-30 ENCOUNTER — Encounter: Payer: Self-pay | Admitting: Physician Assistant

## 2020-01-30 ENCOUNTER — Other Ambulatory Visit: Payer: Self-pay

## 2020-01-30 ENCOUNTER — Ambulatory Visit (INDEPENDENT_AMBULATORY_CARE_PROVIDER_SITE_OTHER): Payer: Medicare PPO | Admitting: Physician Assistant

## 2020-01-30 ENCOUNTER — Other Ambulatory Visit: Payer: Self-pay | Admitting: Rheumatology

## 2020-01-30 VITALS — BP 171/86 | HR 53 | Resp 15 | Ht 68.0 in | Wt 193.4 lb

## 2020-01-30 DIAGNOSIS — L4050 Arthropathic psoriasis, unspecified: Secondary | ICD-10-CM | POA: Diagnosis not present

## 2020-01-30 DIAGNOSIS — M7061 Trochanteric bursitis, right hip: Secondary | ICD-10-CM

## 2020-01-30 DIAGNOSIS — L409 Psoriasis, unspecified: Secondary | ICD-10-CM | POA: Diagnosis not present

## 2020-01-30 DIAGNOSIS — Z79899 Other long term (current) drug therapy: Secondary | ICD-10-CM | POA: Diagnosis not present

## 2020-01-30 DIAGNOSIS — Z96652 Presence of left artificial knee joint: Secondary | ICD-10-CM | POA: Diagnosis not present

## 2020-01-30 DIAGNOSIS — Z8639 Personal history of other endocrine, nutritional and metabolic disease: Secondary | ICD-10-CM

## 2020-01-30 DIAGNOSIS — Z8679 Personal history of other diseases of the circulatory system: Secondary | ICD-10-CM

## 2020-01-30 DIAGNOSIS — Z96651 Presence of right artificial knee joint: Secondary | ICD-10-CM

## 2020-01-30 DIAGNOSIS — M7062 Trochanteric bursitis, left hip: Secondary | ICD-10-CM

## 2020-01-30 LAB — CBC WITH DIFFERENTIAL/PLATELET
Absolute Monocytes: 530 cells/uL (ref 200–950)
Basophils Absolute: 57 cells/uL (ref 0–200)
Basophils Relative: 1 %
Eosinophils Absolute: 137 cells/uL (ref 15–500)
Eosinophils Relative: 2.4 %
HCT: 47.7 % (ref 38.5–50.0)
Hemoglobin: 16.2 g/dL (ref 13.2–17.1)
Lymphs Abs: 2633 cells/uL (ref 850–3900)
MCH: 32.7 pg (ref 27.0–33.0)
MCHC: 34 g/dL (ref 32.0–36.0)
MCV: 96.4 fL (ref 80.0–100.0)
MPV: 10 fL (ref 7.5–12.5)
Monocytes Relative: 9.3 %
Neutro Abs: 2343 cells/uL (ref 1500–7800)
Neutrophils Relative %: 41.1 %
Platelets: 243 10*3/uL (ref 140–400)
RBC: 4.95 10*6/uL (ref 4.20–5.80)
RDW: 15.1 % — ABNORMAL HIGH (ref 11.0–15.0)
Total Lymphocyte: 46.2 %
WBC: 5.7 10*3/uL (ref 3.8–10.8)

## 2020-01-30 LAB — COMPLETE METABOLIC PANEL WITH GFR
AG Ratio: 1.5 (calc) (ref 1.0–2.5)
ALT: 37 U/L (ref 9–46)
AST: 25 U/L (ref 10–35)
Albumin: 4.2 g/dL (ref 3.6–5.1)
Alkaline phosphatase (APISO): 58 U/L (ref 35–144)
BUN: 16 mg/dL (ref 7–25)
CO2: 27 mmol/L (ref 20–32)
Calcium: 9.7 mg/dL (ref 8.6–10.3)
Chloride: 101 mmol/L (ref 98–110)
Creat: 0.81 mg/dL (ref 0.70–1.25)
GFR, Est African American: 107 mL/min/{1.73_m2} (ref >=60)
GFR, Est Non African American: 93 mL/min/{1.73_m2} (ref >=60)
Globulin: 2.8 g/dL (calc) (ref 1.9–3.7)
Glucose, Bld: 126 mg/dL — ABNORMAL HIGH (ref 65–99)
Potassium: 4.1 mmol/L (ref 3.5–5.3)
Sodium: 136 mmol/L (ref 135–146)
Total Bilirubin: 1.4 mg/dL — ABNORMAL HIGH (ref 0.2–1.2)
Total Protein: 7 g/dL (ref 6.1–8.1)

## 2020-01-30 MED ORDER — METHOTREXATE SODIUM CHEMO INJECTION (PF) 50 MG/2ML: INTRAMUSCULAR | 2 refills | Status: DC

## 2020-01-30 NOTE — Telephone Encounter (Signed)
Patient has switched to [facility].   Submitted a Prior Authorization request to [facility] for [place] via Phone. Will update once we receive a response.   PA# [id number] Phone- [phone]

## 2020-01-30 NOTE — Patient Instructions (Addendum)
COVID-19 vaccine recommendations:  ° °COVID-19 vaccine is recommended for everyone (unless you are allergic to a vaccine component), even if you are on a medication that suppresses your immune system.  ° °If you are on Methotrexate, Cellcept (mycophenolate), Rinvoq, Xeljanz, and Olumiant- hold the medication for 1 week after each vaccine. Hold Methotrexate for 2 weeks after the single dose COVID-19 vaccine.  ° °If you are on Orencia subcutaneous injection - hold medication one week prior to and one week after the first COVID-19 vaccine dose (only).  ° °If you are on Orencia IV infusions- time vaccination administration so that the first COVID-19 vaccination will occur four weeks after the infusion and postpone the subsequent infusion by one week.  ° °If you are on Cyclophosphamide or Rituxan infusions please contact your doctor prior to receiving the COVID-19 vaccine.  ° °Do not take Tylenol or any anti-inflammatory medications (NSAIDs) 24 hours prior to the COVID-19 vaccination.  ° °There is no direct evidence about the efficacy of the COVID-19 vaccine in individuals who are on medications that suppress the immune system.  ° °Even if you are fully vaccinated, and you are on any medications that suppress your immune system, please continue to wear a mask, maintain at least six feet social distance and practice hand hygiene.  ° °If you develop a COVID-19 infection, please contact your PCP or our office to determine if you need antibody infusion. ° °The booster vaccine is now available for immunocompromised patients. It is advised that if you had Pfizer vaccine you should get Pfizer booster.  If you had a Moderna vaccine then you should get a Moderna booster. Johnson and Johnson does not have a booster vaccine at this time. ° °Please see the following web sites for updated information.   ° °https://www.rheumatology.org/Portals/0/Files/COVID-19-Vaccination-Patient-Resources.pdf ° °https://www.rheumatology.org/About-Us/Newsroom/Press-Releases/ID/1159 ° °Standing Labs °We placed an order today for your standing lab work.  ° °Please have your standing labs drawn in December and every 3 months  ° °If possible, please have your labs drawn 2 weeks prior to your appointment so that the provider can discuss your results at your appointment. ° °We have open lab daily °Monday through Thursday from 8:30-12:30 PM and 1:30-4:30 PM and Friday from 8:30-12:30 PM and 1:30-4:00 PM °at the office of Dr. Shaili Deveshwar, [facility].   °Please be advised, patients with office appointments requiring lab work will take precedents over walk-in lab work.  °If possible, please come for your lab work on Monday and Friday afternoons, as you may experience shorter wait times. °The office is located at [address], [address], [place], [place] [address] °No appointment is necessary.   °Labs are drawn by [facility]. Please bring your co-pay at the time of your lab draw.  You may receive a bill from [facility] for your lab work. ° °If you wish to have your labs drawn at another location, please call the office 24 hours in advance to send orders. ° °If you have any questions regarding directions or hours of operation,  °please call [phone].   °As a reminder, please drink plenty of water prior to coming for your lab work. Thanks! ° ° °

## 2020-01-31 NOTE — Progress Notes (Signed)
CBC WNL.  Total bilirubin borderline elevated-1.4.  LFTs WNL. Glucose is elevated-126. Rest of CMP WNL.   Please forward lab work to PCP.

## 2020-02-05 NOTE — Telephone Encounter (Signed)
Received notification from [facility] regarding a prior authorization for HUMIRA. Authorization has been APPROVED from 01/30/20 to 05/24/20.    Mariella Saa, PharmD, [place], CPP Clinical Specialty Pharmacist (Rheumatology and Pulmonology)  02/05/2020 10:47 AM

## 2020-02-12 ENCOUNTER — Ambulatory Visit (INDEPENDENT_AMBULATORY_CARE_PROVIDER_SITE_OTHER): Payer: Medicare PPO | Admitting: *Deleted

## 2020-02-12 ENCOUNTER — Other Ambulatory Visit: Payer: Self-pay

## 2020-02-12 DIAGNOSIS — B351 Tinea unguium: Secondary | ICD-10-CM

## 2020-02-12 NOTE — Progress Notes (Signed)
Patient presents today for the 5th laser treatment. Diagnosed with mycotic nail infection by Dr. Jacqualyn Posey.   Toenail most affected 1st and 3rd nails right, however Dr. Jacqualyn Posey would like all toenails treated. The hallux nail has grown out quite a bit and looking a lot better.  All other systems are negative.  Nails were filed thin. Laser therapy was administered to 1-5 toenails bilateral and patient tolerated the treatment well. All safety precautions were in place.    Follow up in 8 weeks for laser # 6.  ~Take final pic next visit~

## 2020-03-01 ENCOUNTER — Other Ambulatory Visit: Payer: Self-pay

## 2020-03-01 MED ORDER — HUMIRA PEN 40 MG/0.8ML ~~LOC~~ PNKT: PEN_INJECTOR | SUBCUTANEOUS | 0 refills | Status: DC

## 2020-03-01 NOTE — Telephone Encounter (Signed)
Patient called requesting prescription refill of Humira to be sent to his [place].  Patient states he is out of medication and due to take his next injection next weekend.  Patient states the prescription can be faxed or called into the pharmacy.  Fax [phone]     Phone   [phone]

## 2020-03-01 NOTE — Telephone Encounter (Signed)
Last Visit: 01/30/2020 Next Visit: 07/01/2020 Labs: 01/30/2020 CBC WNL. Total bilirubin borderline elevated-1.4. LFTs WNL. Glucose is elevated-126. Rest of CMP WNL.  TB Gold: 10/31/2019 Neg    Current Dose per office note 01/30/2020: Humira 40 mg sq injections every 14 days  DX: Psoriatic arthropathy   Okay to refill Humira?

## 2020-03-07 ENCOUNTER — Telehealth: Payer: Self-pay

## 2020-03-07 NOTE — Telephone Encounter (Signed)
[facility] left a voicemail stating Humira is a first fill for [facility] and requesting a return call to verify diagnosis code and dosing.  [phone]

## 2020-03-08 NOTE — Telephone Encounter (Signed)
Spoke with [facility] and provided ICD 10 code. They will contact patient and schedule shipment.

## 2020-04-11 DIAGNOSIS — Z6829 Body mass index (BMI) 29.0-29.9, adult: Secondary | ICD-10-CM | POA: Diagnosis not present

## 2020-04-11 DIAGNOSIS — Z7189 Other specified counseling: Secondary | ICD-10-CM | POA: Diagnosis not present

## 2020-04-11 DIAGNOSIS — Z299 Encounter for prophylactic measures, unspecified: Secondary | ICD-10-CM | POA: Diagnosis not present

## 2020-04-11 DIAGNOSIS — Z79899 Other long term (current) drug therapy: Secondary | ICD-10-CM | POA: Diagnosis not present

## 2020-04-11 DIAGNOSIS — I10 Essential (primary) hypertension: Secondary | ICD-10-CM | POA: Diagnosis not present

## 2020-04-11 DIAGNOSIS — Z1331 Encounter for screening for depression: Secondary | ICD-10-CM | POA: Diagnosis not present

## 2020-04-11 DIAGNOSIS — Z0000 Encounter for general adult medical examination without abnormal findings: Secondary | ICD-10-CM | POA: Diagnosis not present

## 2020-04-11 DIAGNOSIS — E039 Hypothyroidism, unspecified: Secondary | ICD-10-CM | POA: Diagnosis not present

## 2020-04-11 DIAGNOSIS — R5383 Other fatigue: Secondary | ICD-10-CM | POA: Diagnosis not present

## 2020-04-11 DIAGNOSIS — Z125 Encounter for screening for malignant neoplasm of prostate: Secondary | ICD-10-CM | POA: Diagnosis not present

## 2020-04-11 DIAGNOSIS — Z1339 Encounter for screening examination for other mental health and behavioral disorders: Secondary | ICD-10-CM | POA: Diagnosis not present

## 2020-04-12 ENCOUNTER — Ambulatory Visit (INDEPENDENT_AMBULATORY_CARE_PROVIDER_SITE_OTHER): Payer: Medicare PPO | Admitting: *Deleted

## 2020-04-12 ENCOUNTER — Other Ambulatory Visit: Payer: Self-pay

## 2020-04-12 DIAGNOSIS — B351 Tinea unguium: Secondary | ICD-10-CM

## 2020-04-12 NOTE — Progress Notes (Signed)
Patient presents today for the 6th laser treatment. Diagnosed with mycotic nail infection by Dr. Jacqualyn Posey.   Toenail most affected 1st and 3rd nails right, however Dr. Jacqualyn Posey would like all toenails treated. The nails are looking significantly better.   He did have an injury to the 1st, 2nd and 3rd toes on the left foot about 6 weeks ago. He dropped something on them. There is damage to the 3 nails. The 2nd one has fallen off and the 3rd is very thick and black. The 2nd toe is still quite swollen and red. I did advise him to have them checked seeing there some still some redness and swelling and discomfort.  All other systems are negative.  Nails were filed thin. Laser therapy was administered to 1-5 toenails bilateral and patient tolerated the treatment well. All safety precautions were in place.    Patient has completed the recommended laser treatments. He will follow up with Dr. Jacqualyn Posey in 3 months to evaluate progress.   ~Final nail pics taken today~

## 2020-04-12 NOTE — Patient Instructions (Signed)

## 2020-04-24 DIAGNOSIS — Z1211 Encounter for screening for malignant neoplasm of colon: Secondary | ICD-10-CM | POA: Diagnosis not present

## 2020-04-24 DIAGNOSIS — Z6828 Body mass index (BMI) 28.0-28.9, adult: Secondary | ICD-10-CM | POA: Diagnosis not present

## 2020-05-01 ENCOUNTER — Other Ambulatory Visit: Payer: Self-pay | Admitting: Physician Assistant

## 2020-05-01 NOTE — Telephone Encounter (Addendum)
Last Visit: 01/30/2020 Next Visit: 07/01/2020 Labs: 01/30/2020 CBC WNL. Total bilirubin borderline elevated-1.4. LFTs WNL. Glucose is elevated-126. Rest of CMP WNL.   Current Dose per office note 01/30/2020: MTX 0.8 ml sq once weekly DX: Psoriatic arthropathy   Patient advised he is due to update labs. Patient states he will update this week.  Okay to refill MTX?

## 2020-05-07 ENCOUNTER — Other Ambulatory Visit: Payer: Self-pay | Admitting: *Deleted

## 2020-05-07 DIAGNOSIS — Z79899 Other long term (current) drug therapy: Secondary | ICD-10-CM

## 2020-05-08 LAB — COMPLETE METABOLIC PANEL WITH GFR
AG Ratio: 1.6 (calc) (ref 1.0–2.5)
ALT: 46 U/L (ref 9–46)
AST: 33 U/L (ref 10–35)
Albumin: 4.2 g/dL (ref 3.6–5.1)
Alkaline phosphatase (APISO): 63 U/L (ref 35–144)
BUN: 21 mg/dL (ref 7–25)
CO2: 29 mmol/L (ref 20–32)
Calcium: 9.5 mg/dL (ref 8.6–10.3)
Chloride: 99 mmol/L (ref 98–110)
Creat: 0.86 mg/dL (ref 0.70–1.25)
GFR, Est African American: 105 mL/min/{1.73_m2} (ref >=60)
GFR, Est Non African American: 90 mL/min/{1.73_m2} (ref >=60)
Globulin: 2.7 g/dL (calc) (ref 1.9–3.7)
Glucose, Bld: 99 mg/dL (ref 65–99)
Potassium: 4.3 mmol/L (ref 3.5–5.3)
Sodium: 136 mmol/L (ref 135–146)
Total Bilirubin: 1.1 mg/dL (ref 0.2–1.2)
Total Protein: 6.9 g/dL (ref 6.1–8.1)

## 2020-05-08 LAB — CBC WITH DIFFERENTIAL/PLATELET
Absolute Monocytes: 602 cells/uL (ref 200–950)
Basophils Absolute: 51 cells/uL (ref 0–200)
Basophils Relative: 0.8 %
Eosinophils Absolute: 90 cells/uL (ref 15–500)
Eosinophils Relative: 1.4 %
HCT: 51 % — ABNORMAL HIGH (ref 38.5–50.0)
Hemoglobin: 17.4 g/dL — ABNORMAL HIGH (ref 13.2–17.1)
Lymphs Abs: 3123 cells/uL (ref 850–3900)
MCH: 31.8 pg (ref 27.0–33.0)
MCHC: 34.1 g/dL (ref 32.0–36.0)
MCV: 93.2 fL (ref 80.0–100.0)
MPV: 10.1 fL (ref 7.5–12.5)
Monocytes Relative: 9.4 %
Neutro Abs: 2534 cells/uL (ref 1500–7800)
Neutrophils Relative %: 39.6 %
Platelets: 268 10*3/uL (ref 140–400)
RBC: 5.47 10*6/uL (ref 4.20–5.80)
RDW: 12.6 % (ref 11.0–15.0)
Total Lymphocyte: 48.8 %
WBC: 6.4 10*3/uL (ref 3.8–10.8)

## 2020-05-08 NOTE — Progress Notes (Signed)
CBC is normal except hemoglobin is mildly elevated.  CMP is normal.

## 2020-05-09 DIAGNOSIS — I10 Essential (primary) hypertension: Secondary | ICD-10-CM | POA: Diagnosis not present

## 2020-05-09 DIAGNOSIS — I2510 Atherosclerotic heart disease of native coronary artery without angina pectoris: Secondary | ICD-10-CM | POA: Diagnosis not present

## 2020-05-09 DIAGNOSIS — E1165 Type 2 diabetes mellitus with hyperglycemia: Secondary | ICD-10-CM | POA: Diagnosis not present

## 2020-05-09 DIAGNOSIS — F419 Anxiety disorder, unspecified: Secondary | ICD-10-CM | POA: Diagnosis not present

## 2020-05-09 DIAGNOSIS — R739 Hyperglycemia, unspecified: Secondary | ICD-10-CM | POA: Diagnosis not present

## 2020-05-09 DIAGNOSIS — Z299 Encounter for prophylactic measures, unspecified: Secondary | ICD-10-CM | POA: Diagnosis not present

## 2020-05-09 DIAGNOSIS — L4050 Arthropathic psoriasis, unspecified: Secondary | ICD-10-CM | POA: Diagnosis not present

## 2020-05-18 ENCOUNTER — Other Ambulatory Visit: Payer: Self-pay | Admitting: Physician Assistant

## 2020-05-18 DIAGNOSIS — L4050 Arthropathic psoriasis, unspecified: Secondary | ICD-10-CM

## 2020-05-20 NOTE — Telephone Encounter (Signed)
Last Visit: 01/30/2020 Next Visit: 07/01/2020 Labs: 05/07/2020 CBC is normal except hemoglobin is mildly elevated. CMP is normal. TB Gold: 10/31/2019 TB gold negative.   Current Dose per office note 01/30/2020: Humira 40 mg subcutaneous injections every 14 days  DX: Psoriatic arthropathy  Okay to refill Humira?

## 2020-05-23 ENCOUNTER — Telehealth: Payer: Self-pay

## 2020-05-23 DIAGNOSIS — A421 Abdominal actinomycosis: Secondary | ICD-10-CM | POA: Diagnosis not present

## 2020-05-23 NOTE — Telephone Encounter (Signed)
Patient states his daughter tested positive for COVID on 05/20/2020 and patient was exposed to her on 05/18/2020-05/19/2020. Patient has not been vaccinated. Patient is currently experiencing "feeling warm" and flush but no fever. Patient states he does not plan on getting tested unless he develops symptoms. I spoke with Sherron Ales, PA-C and have advised patient to get tested, hold humira and methotrexate and also provided the patient with the phone number to schedule monoclonal antibody infusion if needed. The patient verbalized understanding and will go get tested today. The patient is aware that we do not treat COVID and he will contact his PCP if anything changes over the weekend. The patient will call the office next week with an update.

## 2020-06-11 DIAGNOSIS — Z6829 Body mass index (BMI) 29.0-29.9, adult: Secondary | ICD-10-CM | POA: Diagnosis not present

## 2020-06-11 DIAGNOSIS — F419 Anxiety disorder, unspecified: Secondary | ICD-10-CM | POA: Diagnosis not present

## 2020-06-11 DIAGNOSIS — R42 Dizziness and giddiness: Secondary | ICD-10-CM | POA: Diagnosis not present

## 2020-06-11 DIAGNOSIS — I10 Essential (primary) hypertension: Secondary | ICD-10-CM | POA: Diagnosis not present

## 2020-06-11 DIAGNOSIS — L4050 Arthropathic psoriasis, unspecified: Secondary | ICD-10-CM | POA: Diagnosis not present

## 2020-06-11 DIAGNOSIS — Z299 Encounter for prophylactic measures, unspecified: Secondary | ICD-10-CM | POA: Diagnosis not present

## 2020-06-11 DIAGNOSIS — Z87891 Personal history of nicotine dependence: Secondary | ICD-10-CM | POA: Diagnosis not present

## 2020-06-17 NOTE — Progress Notes (Deleted)
Office Visit Note  Patient: Brandon Frazier             Date of Birth: 08-Sep-1955           MRN: [id number]             PCP: Ignatius Specking, MD Referring: Ignatius Specking, MD Visit Date: 07/01/2020 Occupation: @GUAROCC @  Subjective:  No chief complaint on file.   History of Present Illness: Brandon Frazier is a 65 y.o. male ***   Activities of Daily Living:  Patient reports morning stiffness for *** {minute/hour:19697}.   Patient {ACTIONS;DENIES/REPORTS:21021675::"Denies"} nocturnal pain.  Difficulty dressing/grooming: {ACTIONS;DENIES/REPORTS:21021675::"Denies"} Difficulty climbing stairs: {ACTIONS;DENIES/REPORTS:21021675::"Denies"} Difficulty getting out of chair: {ACTIONS;DENIES/REPORTS:21021675::"Denies"} Difficulty using hands for taps, buttons, cutlery, and/or writing: {ACTIONS;DENIES/REPORTS:21021675::"Denies"}  No Rheumatology ROS completed.   PMFS History:  Patient Active Problem List   Diagnosis Date Noted  . Onychomycosis 08/28/2019  . Psoriatic arthropathy (HCC) 06/05/2016  . History of hypertension 06/05/2016  . History of diabetes mellitus 06/05/2016  . History of coronary artery disease 06/05/2016  . History of hyperlipidemia 06/05/2016  . High risk medication use 06/05/2016  . Status post left partial knee replacement 06/05/2016  . Primary localized osteoarthrosis, lower leg 02/16/2014  . 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 Psoriasis 12/10/2010  . Knee pain, bilateral 10/30/2010  . Hip pain, bilateral 10/30/2010  . Leg length discrepancy 10/30/2010    Past Medical History:  Diagnosis Date  . Anterior myocardial infarction (HCC) 1995  . Anxiety   . CAD (coronary artery disease)    Angioplasty of LAD 1995  . Essential hypertension   . GERD (gastroesophageal reflux disease)   . Psoriatic arthritis (HCC)   . Type 2 diabetes mellitus (HCC)     Family History  Problem Relation Age of Onset  . GER disease Mother   . Heart disease Mother   . Liver cancer Father    . Arthritis Son   . Psoriasis Son   . Psoriasis Daughter    Past Surgical History:  Procedure Laterality Date  . CARDIAC CATHETERIZATION     1995, 2001  . COLONOSCOPY    . EYE SURGERY Bilateral    lasik  . INSERTION OF MESH N/A 04/23/2016   Procedure: INSERTION OF MESH;  Surgeon: Ovidio Kin, MD;  Location: [facility] OR;  Service: General;  Laterality: N/A;  . KNEE ARTHROPLASTY    . left knee arthroscopy     1990, 1997  . MEDIAL PARTIAL KNEE REPLACEMENT Right 05/2019  . PARTIAL KNEE ARTHROPLASTY Left 02/16/2014   Procedure: LEFT KNEE UNI ARTHROPLASTY;  Surgeon: Eulas Post, MD;  Location: [facility];  Service: Orthopedics;  Laterality: Left;  . UMBILICAL HERNIA REPAIR N/A 04/23/2016   Procedure: LAPAROSCOPIC UMBILICAL HERNIA;  Surgeon: Ovidio Kin, MD;  Location: [facility] OR;  Service: General;  Laterality: N/A;   Social History   Social History Narrative  . Not on file   Immunization History  Administered Date(s) Administered  . Influenza,inj,Quad PF,6+ Mos 03/19/2017, 03/24/2018  . Tdap 05/14/2018     Objective: Vital Signs: There were no vitals taken for this visit.   Physical Exam   Musculoskeletal Exam: ***  CDAI Exam: CDAI Score: -- Patient Global: --; Provider Global: -- Swollen: --; Tender: -- Joint Exam 07/01/2020   No joint exam has been documented for this visit   There is currently no information documented on the homunculus. Go to the Rheumatology activity and complete the homunculus joint exam.  Investigation:  No additional findings.  Imaging: No results found.  Recent Labs: Lab Results  Component Value Date   WBC 6.4 05/07/2020   HGB 17.4 (H) 05/07/2020   PLT 268 05/07/2020   NA 136 05/07/2020   K 4.3 05/07/2020   CL 99 05/07/2020   CO2 29 05/07/2020   GLUCOSE 99 05/07/2020   BUN 21 05/07/2020   CREATININE 0.86 05/07/2020   BILITOT 1.1 05/07/2020   AST 33 05/07/2020   ALT 46 05/07/2020   PROT 6.9 05/07/2020   CALCIUM 9.5  05/07/2020   GFRAA 105 05/07/2020   QFTBGOLDPLUS NEGATIVE 10/31/2019    Speciality Comments: No specialty comments available.  Procedures:  No procedures performed Allergies: Aspirin and Codeine   Assessment / Plan:     Visit Diagnoses: Psoriatic arthropathy (HCC)  Psoriasis  High risk medication use  Status post left partial knee replacement  Status post right partial knee replacement  Trochanteric bursitis of both hips  History of hypertension  History of hyperlipidemia  History of coronary artery disease  History of diabetes mellitus  Orders: No orders of the defined types were placed in this encounter.  No orders of the defined types were placed in this encounter.   Face-to-face time spent with patient was *** minutes. Greater than 50% of time was spent in counseling and coordination of care.  Follow-Up Instructions: No follow-ups on file.   Gearldine Bienenstock, PA-C  Note - This record has been created using Dragon software.  Chart creation errors have been sought, but may not always  have been located. Such creation errors do not reflect on  the standard of medical care.

## 2020-07-01 ENCOUNTER — Ambulatory Visit: Payer: Medicare PPO | Admitting: Physician Assistant

## 2020-07-01 DIAGNOSIS — L409 Psoriasis, unspecified: Secondary | ICD-10-CM

## 2020-07-01 DIAGNOSIS — L4050 Arthropathic psoriasis, unspecified: Secondary | ICD-10-CM

## 2020-07-01 DIAGNOSIS — Z01812 Encounter for preprocedural laboratory examination: Secondary | ICD-10-CM | POA: Diagnosis not present

## 2020-07-01 DIAGNOSIS — Z20822 Contact with and (suspected) exposure to covid-19: Secondary | ICD-10-CM | POA: Diagnosis not present

## 2020-07-01 DIAGNOSIS — Z8679 Personal history of other diseases of the circulatory system: Secondary | ICD-10-CM

## 2020-07-01 DIAGNOSIS — Z96652 Presence of left artificial knee joint: Secondary | ICD-10-CM

## 2020-07-01 DIAGNOSIS — Z96651 Presence of right artificial knee joint: Secondary | ICD-10-CM

## 2020-07-01 DIAGNOSIS — M7061 Trochanteric bursitis, right hip: Secondary | ICD-10-CM

## 2020-07-01 DIAGNOSIS — Z8639 Personal history of other endocrine, nutritional and metabolic disease: Secondary | ICD-10-CM

## 2020-07-01 DIAGNOSIS — Z79899 Other long term (current) drug therapy: Secondary | ICD-10-CM

## 2020-07-04 DIAGNOSIS — Z1211 Encounter for screening for malignant neoplasm of colon: Secondary | ICD-10-CM | POA: Diagnosis not present

## 2020-07-04 DIAGNOSIS — E785 Hyperlipidemia, unspecified: Secondary | ICD-10-CM | POA: Diagnosis not present

## 2020-07-04 DIAGNOSIS — K219 Gastro-esophageal reflux disease without esophagitis: Secondary | ICD-10-CM | POA: Diagnosis not present

## 2020-07-04 DIAGNOSIS — I2510 Atherosclerotic heart disease of native coronary artery without angina pectoris: Secondary | ICD-10-CM | POA: Diagnosis not present

## 2020-07-04 DIAGNOSIS — E039 Hypothyroidism, unspecified: Secondary | ICD-10-CM | POA: Diagnosis not present

## 2020-07-04 DIAGNOSIS — I10 Essential (primary) hypertension: Secondary | ICD-10-CM | POA: Diagnosis not present

## 2020-07-04 DIAGNOSIS — I252 Old myocardial infarction: Secondary | ICD-10-CM | POA: Diagnosis not present

## 2020-07-04 DIAGNOSIS — Z79899 Other long term (current) drug therapy: Secondary | ICD-10-CM | POA: Diagnosis not present

## 2020-07-04 HISTORY — PX: COLONOSCOPY: SHX174

## 2020-07-09 ENCOUNTER — Encounter: Payer: Self-pay | Admitting: Rheumatology

## 2020-07-09 ENCOUNTER — Other Ambulatory Visit: Payer: Self-pay

## 2020-07-09 ENCOUNTER — Ambulatory Visit: Payer: Medicare PPO | Admitting: Rheumatology

## 2020-07-09 VITALS — BP 145/87 | HR 64 | Resp 15 | Ht 68.0 in | Wt 194.0 lb

## 2020-07-09 DIAGNOSIS — L409 Psoriasis, unspecified: Secondary | ICD-10-CM | POA: Diagnosis not present

## 2020-07-09 DIAGNOSIS — M7062 Trochanteric bursitis, left hip: Secondary | ICD-10-CM

## 2020-07-09 DIAGNOSIS — U071 COVID-19: Secondary | ICD-10-CM

## 2020-07-09 DIAGNOSIS — Z8639 Personal history of other endocrine, nutritional and metabolic disease: Secondary | ICD-10-CM | POA: Diagnosis not present

## 2020-07-09 DIAGNOSIS — Z96652 Presence of left artificial knee joint: Secondary | ICD-10-CM | POA: Diagnosis not present

## 2020-07-09 DIAGNOSIS — L4050 Arthropathic psoriasis, unspecified: Secondary | ICD-10-CM | POA: Diagnosis not present

## 2020-07-09 DIAGNOSIS — M545 Low back pain, unspecified: Secondary | ICD-10-CM | POA: Diagnosis not present

## 2020-07-09 DIAGNOSIS — Z96651 Presence of right artificial knee joint: Secondary | ICD-10-CM | POA: Diagnosis not present

## 2020-07-09 DIAGNOSIS — Z79899 Other long term (current) drug therapy: Secondary | ICD-10-CM

## 2020-07-09 DIAGNOSIS — Z8679 Personal history of other diseases of the circulatory system: Secondary | ICD-10-CM

## 2020-07-09 NOTE — Patient Instructions (Addendum)
Standing Labs We placed an order today for your standing lab work.   Please have your standing labs drawn in March and every 3 months  If possible, please have your labs drawn 2 weeks prior to your appointment so that the provider can discuss your results at your appointment.  We have open lab daily Monday through Thursday from 1:30-4:30 PM and Friday from 1:30-4:00 PM at the office of Dr. Bo Merino, [facility].   Please be advised, all patients with office appointments requiring lab work will take precedents over walk-in lab work.  If possible, please come for your lab work on Monday and Friday afternoons, as you may experience shorter wait times. The office is located at [address] [place], [place], [place] [address] No appointment is necessary.   Labs are drawn by [facility]. Please bring your co-pay at the time of your lab draw.  You may receive a bill from [facility] for your lab work.  If you wish to have your labs drawn at another location, please call the office 24 hours in advance to send orders.  If you have any questions regarding directions or hours of operation,  please call [phone].   As a reminder, please drink plenty of water prior to coming for your lab work. Thanks!  COVID-19 vaccine recommendations:   COVID-19 vaccine is recommended for everyone (unless you are allergic to a vaccine component), even if you are on a medication that suppresses your immune system.   If you are on Methotrexate, Cellcept (mycophenolate), Rinvoq, Morrie Sheldon, and Olumiant- hold the medication for 1 week after each vaccine. Hold Methotrexate for 2 weeks after the single dose COVID-19 vaccine.   Do not take Tylenol or any anti-inflammatory medications (NSAIDs) 24 hours prior to the COVID-19 vaccination.   There is no direct evidence about the efficacy of the COVID-19 vaccine in individuals who are on medications that suppress the immune system.   Even if you are fully  vaccinated, and you are on any medications that suppress your immune system, please continue to wear a mask, maintain at least six feet social distance and practice hand hygiene.   If you develop a COVID-19 infection, please contact your PCP or our office to determine if you need monoclonal antibody infusion.  The booster vaccine is now available for immunocompromised patients.   Please see the following web sites for updated information.   [email or web address]   Heart Disease Prevention   Your inflammatory disease increases your risk of heart disease which includes heart attack, stroke, atrial fibrillation (irregular heartbeats), high blood pressure, heart failure and atherosclerosis (plaque in the arteries).  It is important to reduce your risk by:   . Keep blood pressure, cholesterol, and blood sugar at healthy levels   . Smoking Cessation   . Maintain a healthy weight  o BMI 20-25   . Eat a healthy diet  o Plenty of fresh fruit, vegetables, and whole grains  o Limit saturated fats, foods high in sodium, and added sugars  o DASH and Mediterranean diet   . Increase physical activity  o Recommend moderate physically activity for 150 minutes per week/ 30 minutes a day for five days a week These can be broken up into three separate ten-minute sessions during the day.   . Reduce Stress  . Meditation, slow breathing exercises, yoga, coloring books  . Dental visits twice a year    Back Exercises The following exercises strengthen the muscles that help to support the  trunk and back. They also help to keep the lower back flexible. Doing these exercises can help to prevent back pain or lessen existing pain.  If you have back pain or discomfort, try doing these exercises 2-3 times each day or as told by your health care provider.  As your pain improves, do them once each day, but increase the number of times that you  repeat the steps for each exercise (do more repetitions).  To prevent the recurrence of back pain, continue to do these exercises once each day or as told by your health care provider. Do exercises exactly as told by your health care provider and adjust them as directed. It is normal to feel mild stretching, pulling, tightness, or discomfort as you do these exercises, but you should stop right away if you feel sudden pain or your pain gets worse. Exercises Single knee to chest Repeat these steps 3-5 times for each leg: 1. Lie on your back on a firm bed or the floor with your legs extended. 2. Bring one knee to your chest. Your other leg should stay extended and in contact with the floor. 3. Hold your knee in place by grabbing your knee or thigh with both hands and hold. 4. Pull on your knee until you feel a gentle stretch in your lower back or buttocks. 5. Hold the stretch for 10-30 seconds. 6. Slowly release and straighten your leg. Pelvic tilt Repeat these steps 5-10 times: 1. Lie on your back on a firm bed or the floor with your legs extended. 2. Bend your knees so they are pointing toward the ceiling and your feet are flat on the floor. 3. Tighten your lower abdominal muscles to press your lower back against the floor. This motion will tilt your pelvis so your tailbone points up toward the ceiling instead of pointing to your feet or the floor. 4. With gentle tension and even breathing, hold this position for 5-10 seconds. Cat-cow Repeat these steps until your lower back becomes more flexible: 1. Get into a hands-and-knees position on a firm surface. Keep your hands under your shoulders, and keep your knees under your hips. You may place padding under your knees for comfort. 2. Let your head hang down toward your chest. Contract your abdominal muscles and point your tailbone toward the floor so your lower back becomes rounded like the back of a cat. 3. Hold this position for 5  seconds. 4. Slowly lift your head, let your abdominal muscles relax and point your tailbone up toward the ceiling so your back forms a sagging arch like the back of a cow. 5. Hold this position for 5 seconds.   Press-ups Repeat these steps 5-10 times: 1. Lie on your abdomen (face-down) on the floor. 2. Place your palms near your head, about shoulder-width apart. 3. Keeping your back as relaxed as possible and keeping your hips on the floor, slowly straighten your arms to raise the top half of your body and lift your shoulders. Do not use your back muscles to raise your upper torso. You may adjust the placement of your hands to make yourself more comfortable. 4. Hold this position for 5 seconds while you keep your back relaxed. 5. Slowly return to lying flat on the floor.   Bridges Repeat these steps 10 times: 1. Lie on your back on a firm surface. 2. Bend your knees so they are pointing toward the ceiling and your feet are flat on the floor. Your arms should  be flat at your sides, next to your body. 3. Tighten your buttocks muscles and lift your buttocks off the floor until your waist is at almost the same height as your knees. You should feel the muscles working in your buttocks and the back of your thighs. If you do not feel these muscles, slide your feet 1-2 inches farther away from your buttocks. 4. Hold this position for 3-5 seconds. 5. Slowly lower your hips to the starting position, and allow your buttocks muscles to relax completely. If this exercise is too easy, try doing it with your arms crossed over your chest.   Abdominal crunches Repeat these steps 5-10 times: 1. Lie on your back on a firm bed or the floor with your legs extended. 2. Bend your knees so they are pointing toward the ceiling and your feet are flat on the floor. 3. Cross your arms over your chest. 4. Tip your chin slightly toward your chest without bending your neck. 5. Tighten your abdominal muscles and slowly  raise your trunk (torso) high enough to lift your shoulder blades a tiny bit off the floor. Avoid raising your torso higher than that because it can put too much stress on your low back and does not help to strengthen your abdominal muscles. 6. Slowly return to your starting position. Back lifts Repeat these steps 5-10 times: 1. Lie on your abdomen (face-down) with your arms at your sides, and rest your forehead on the floor. 2. Tighten the muscles in your legs and your buttocks. 3. Slowly lift your chest off the floor while you keep your hips pressed to the floor. Keep the back of your head in line with the curve in your back. Your eyes should be looking at the floor. 4. Hold this position for 3-5 seconds. 5. Slowly return to your starting position. Contact a health care provider if:  Your back pain or discomfort gets much worse when you do an exercise.  Your worsening back pain or discomfort does not lessen within 2 hours after you exercise. If you have any of these problems, stop doing these exercises right away. Do not do them again unless your health care provider says that you can. Get help right away if:  You develop sudden, severe back pain. If this happens, stop doing the exercises right away. Do not do them again unless your health care provider says that you can. This information is not intended to replace advice given to you by your health care provider. Make sure you discuss any questions you have with your health care provider. Document Revised: 09/15/2018 Document Reviewed: 02/10/2018 Elsevier Patient Education  [place].

## 2020-07-09 NOTE — Progress Notes (Signed)
Office Visit Note  Patient: Brandon Frazier             Date of Birth: [birth year]           MRN: [id number]             PCP: Ignatius Specking, MD Referring: Ignatius Specking, MD Visit Date: 07/09/2020 Occupation: @GUAROCC @  Subjective:  Lower back pain   History of Present Illness: Brandon Frazier is a 65 y.o. male with history of psoriatic arthritis, psoriasis and osteoarthritis.  He denies having any joint swelling.  He has had intermittent joint discomfort in his hands.  He denies having any psoriasis or rash.  He has been taking Humira and methotrexate on a regular basis.  He states couple of weeks ago he went to a place and sat on a high chair while doing that he strained his lower back and the pain was radiating into his left lower extremity which is gradually improving.  Still have some muscle pain in his lower back.  He denies any history of iritis, plantar fasciitis or Achilles tendinitis.  His bilateral knee joint replacements are doing well.  The trochanteric bursitis raynauds phenomenon resolved.  Activities of Daily Living:  Patient reports morning stiffness for 20 minutes.   Patient Denies nocturnal pain.  Difficulty dressing/grooming: Denies Difficulty climbing stairs: Denies Difficulty getting out of chair: Denies Difficulty using hands for taps, buttons, cutlery, and/or writing: Denies  Review of Systems  Constitutional: Negative for fatigue.  HENT: Negative for mouth sores, mouth dryness and nose dryness.   Eyes: Negative for pain, itching and dryness.  Respiratory: Negative for shortness of breath and difficulty breathing.   Cardiovascular: Negative for chest pain and palpitations.  Gastrointestinal: Negative for blood in stool, constipation and diarrhea.  Endocrine: Negative for increased urination.  Genitourinary: Negative for difficulty urinating.  Musculoskeletal: Positive for morning stiffness. Negative for arthralgias, joint pain, joint swelling, myalgias, muscle  tenderness and myalgias.  Skin: Negative for color change, rash and redness.  Allergic/Immunologic: Negative for susceptible to infections.  Neurological: Negative for dizziness, numbness, headaches, memory loss and weakness.  Hematological: Negative for bruising/bleeding tendency.  Psychiatric/Behavioral: Negative for confusion.    PMFS History:  Patient Active Problem List   Diagnosis Date Noted  . Onychomycosis 08/28/2019  . Psoriatic arthropathy (HCC) 06/05/2016  . History of hypertension 06/05/2016  . History of diabetes mellitus 06/05/2016  . History of coronary artery disease 06/05/2016  . History of hyperlipidemia 06/05/2016  . High risk medication use 06/05/2016  . Status post left partial knee replacement 06/05/2016  . Primary localized osteoarthrosis, lower leg 02/16/2014  . 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 Psoriasis 12/10/2010  . Knee pain, bilateral 10/30/2010  . Hip pain, bilateral 10/30/2010  . Leg length discrepancy 10/30/2010    Past Medical History:  Diagnosis Date  . Anterior myocardial infarction (HCC) 1995  . Anxiety   . CAD (coronary artery disease)    Angioplasty of LAD 1995  . Essential hypertension   . GERD (gastroesophageal reflux disease)   . Psoriatic arthritis (HCC)   . Type 2 diabetes mellitus (HCC)     Family History  Problem Relation Age of Onset  . GER disease Mother   . Heart disease Mother   . Liver cancer Father   . Arthritis Son   . Psoriasis Son   . Psoriasis Daughter    Past Surgical History:  Procedure Laterality Date  . CARDIAC CATHETERIZATION  1995, 2001  . COLONOSCOPY    . COLONOSCOPY  07/04/2020  . EYE SURGERY Bilateral    lasik  . INSERTION OF MESH N/A 04/23/2016   Procedure: INSERTION OF MESH;  Surgeon: Ovidio Kin, MD;  Location: [facility] OR;  Service: General;  Laterality: N/A;  . KNEE ARTHROPLASTY    . left knee arthroscopy     1990, 1997  . MEDIAL PARTIAL KNEE REPLACEMENT Right 05/2019  . PARTIAL KNEE ARTHROPLASTY  Left 02/16/2014   Procedure: LEFT KNEE UNI ARTHROPLASTY;  Surgeon: Eulas Post, MD;  Location: [facility];  Service: Orthopedics;  Laterality: Left;  . UMBILICAL HERNIA REPAIR N/A 04/23/2016   Procedure: LAPAROSCOPIC UMBILICAL HERNIA;  Surgeon: Ovidio Kin, MD;  Location: [facility] OR;  Service: General;  Laterality: N/A;   Social History   Social History Narrative  . Not on file   Immunization History  Administered Date(s) Administered  . Influenza,inj,Quad PF,6+ Mos 03/19/2017, 03/24/2018  . Tdap 05/14/2018     Objective: Vital Signs: BP (!) 145/87 (BP Location: Right Arm, Patient Position: Sitting, Cuff Size: Normal)   Pulse 64   Resp 15   Ht 5\' 8"  (1.727 m)   Wt 194 lb (88 kg)   BMI 29.50 kg/m    Physical Exam Vitals and nursing note reviewed.  Constitutional:      Appearance: He is well-developed and well-nourished.  HENT:     Head: Normocephalic and atraumatic.  Eyes:     Extraocular Movements: EOM normal.     Conjunctiva/sclera: Conjunctivae normal.     Pupils: Pupils are equal, round, and reactive to light.  Cardiovascular:     Rate and Rhythm: Normal rate and regular rhythm.     Heart sounds: Normal heart sounds.  Pulmonary:     Effort: Pulmonary effort is normal.     Breath sounds: Normal breath sounds.  Abdominal:     General: Bowel sounds are normal.     Palpations: Abdomen is soft.  Musculoskeletal:     Cervical back: Normal range of motion and neck supple.  Skin:    General: Skin is warm and dry.     Capillary Refill: Capillary refill takes less than 2 seconds.  Neurological:     Mental Status: He is alert and oriented to person, place, and time.  Psychiatric:        Mood and Affect: Mood and affect normal.        Behavior: Behavior normal.      Musculoskeletal Exam: C-spine was in good range of motion.  He had good range of motion of his lumbar spine with some discomfort in the lower lumbar paravertebral region.  There was no SI  joint tenderness.  Shoulder joints, elbow joints, wrist joints with good range of motion with no synovitis.  He had bilateral PIP and DIP thickening.  Hip joints and knee joints with good range of motion.  He had no tenderness over MTPs.  There is no evidence of plantar fasciitis or Achilles tendinitis. CDAI Exam: CDAI Score: -- Patient Global: --; Provider Global: -- Swollen: --; Tender: -- Joint Exam 07/09/2020   No joint exam has been documented for this visit   There is currently no information documented on the homunculus. Go to the Rheumatology activity and complete the homunculus joint exam.  Investigation: No additional findings.  Imaging: No results found.  Recent Labs: Lab Results  Component Value Date   WBC 6.4 05/07/2020   HGB 17.4 (H) 05/07/2020  PLT 268 05/07/2020   NA 136 05/07/2020   K 4.3 05/07/2020   CL 99 05/07/2020   CO2 29 05/07/2020   GLUCOSE 99 05/07/2020   BUN 21 05/07/2020   CREATININE 0.86 05/07/2020   BILITOT 1.1 05/07/2020   AST 33 05/07/2020   ALT 46 05/07/2020   PROT 6.9 05/07/2020   CALCIUM 9.5 05/07/2020   GFRAA 105 05/07/2020   QFTBGOLDPLUS NEGATIVE 10/31/2019    Speciality Comments: No specialty comments available.  Procedures:  No procedures performed Allergies: Aspirin and Codeine   Assessment / Plan:     Visit Diagnoses: Psoriatic arthropathy (HCC)-he had no synovitis on examination.  He has intermittent discomfort.  He has been tolerating Humira and methotrexate.  Psoriasis-he had no active psoriasis lesions today.  High risk medication use - Humira 40 mg sq injections every 14 days, MTX 0.8 ml sq once weekly, folic acid 2 mg daily.  His labs from May 07, 2020 were normal except that hemoglobin was slightly elevated.  We will recheck his labs in March.  Will be getting labs every 3 months to monitor for drug toxicity.  TB gold was negative on October 31, 2019.  Acute midline low back pain without sciatica-he developed sudden  sharp pain in his lower back which was radiating to his left lower extremity which is gradually improving.  Today he had some paravertebral muscular discomfort.  He has been taking some muscle relaxers which she had leftover from his knee joint surgery.  Handout on lower back muscle strengthening exercises was given.  Core muscle strengthening were demonstrated in the office.  Status post left partial knee replacement-doing well.  Status post right partial knee replacement-doing well.  History of coronary artery disease  History of hyperlipidemia-his BMI is 29.50.  Weight loss diet and exercise was discussed.  A handout was placed in the AVS.  History of hypertension-his blood pressure is elevated today.  Have advised him to monitor blood pressure closely.  History of diabetes mellitus  COVID-19 virus infection - 02/2019.  Patient is not interested in getting COVID-19 vaccination.  Risk of not getting the vaccine was discussed.  Use of mask, social distancing and hand hygiene was discussed.  Use of monoclonal antibodies were discussed.  I also discussed stopping Humira and methotrexate in case he develops infection.  He can resume the medications when he is asymptomatic for 2 weeks after the infection.    Orders: No orders of the defined types were placed in this encounter.  No orders of the defined types were placed in this encounter.     Follow-Up Instructions: Return in about 5 months (around 12/06/2020) for Psoriatic arthritis.   Pollyann Savoy, MD  Note - This record has been created using Animal nutritionist.  Chart creation errors have been sought, but may not always  have been located. Such creation errors do not reflect on  the standard of medical care.

## 2020-07-15 ENCOUNTER — Ambulatory Visit (INDEPENDENT_AMBULATORY_CARE_PROVIDER_SITE_OTHER): Payer: Medicare PPO | Admitting: Podiatry

## 2020-07-15 ENCOUNTER — Telehealth: Payer: Self-pay

## 2020-07-15 ENCOUNTER — Other Ambulatory Visit: Payer: Self-pay

## 2020-07-15 DIAGNOSIS — B351 Tinea unguium: Secondary | ICD-10-CM

## 2020-07-15 MED ORDER — AMMONIUM LACTATE 12 % EX CREA: TOPICAL_CREAM | CUTANEOUS | 2 refills | Status: AC | PRN

## 2020-07-15 MED ORDER — NONFORMULARY OR COMPOUNDED ITEM: 3 refills | Status: DC

## 2020-07-15 NOTE — Telephone Encounter (Signed)
Prescription for anti fungal nail solution faxed to [facility] 

## 2020-07-15 NOTE — Patient Instructions (Signed)
I have ordered a medication for you that will come from [facility] in [place]. They should be calling you to verify insurance and will mail the medication to you. If you live close by then you can go by their pharmacy to pick up the medication. Their phone number is [phone]. If you do not hear from them in the next few days, please give us a call at [phone].   

## 2020-07-17 ENCOUNTER — Other Ambulatory Visit: Payer: Self-pay | Admitting: Rheumatology

## 2020-07-17 NOTE — Telephone Encounter (Signed)
Last Visit: 07/09/2020 Next Visit: 12/03/2020 Labs: 05/07/2020 CBC is normal except hemoglobin is mildly elevated. CMP is normal.  Current Dose per office note 07/09/2020: MTX 0.8 ml sq once weekly DX: Psoriatic arthropathy   Last Fill: 05/01/2021  Patient advised he is due to update labs in March 2022.   Okay to refill MTX?

## 2020-07-19 NOTE — Progress Notes (Signed)
Subjective: 65 year old male presents the office today for follow-up evaluation of nail fungus.  He underwent treatment with the laser and states that he did well with the fungus located starting to come back.  He does not have any significant pain in the nails and no redness or drainage.  He also gets dry skin.  No other concerns today. Denies any systemic complaints such as fevers, chills, nausea, vomiting. No acute changes since last appointment, and no other complaints at this time.   Objective: AAO x3, NAD DP/PT pulses palpable bilaterally, CRT less than 3 seconds Nondistended gait mildly hypertrophic, dystrophic with yellow-brown discoloration.  There is no pain in the nails and there is no redness or drainage or any signs of infection noted today. Dry skin is present with any skin fissures or openings. No pain with calf compression, swelling, warmth, erythema  Assessment: 65 year old male with onychomycosis, dry skin  Plan: -All treatment options discussed with the patient including all alternatives, risks, complications.  -Discussed treatment options with the nail fungus.  At this time in order to start with topical occasions and I ordered this today through [employer].  Also prescribed AmLactin to his regular pharmacy.  If needed will restart later as well. Cannot do oral medication.  -Patient encouraged to call the office with any questions, concerns, change in symptoms.   Trula Slade DPM

## 2020-07-26 DIAGNOSIS — H40033 Anatomical narrow angle, bilateral: Secondary | ICD-10-CM | POA: Diagnosis not present

## 2020-07-26 DIAGNOSIS — H04123 Dry eye syndrome of bilateral lacrimal glands: Secondary | ICD-10-CM | POA: Diagnosis not present

## 2020-08-06 ENCOUNTER — Other Ambulatory Visit: Payer: Self-pay | Admitting: *Deleted

## 2020-08-06 ENCOUNTER — Telehealth: Payer: Self-pay

## 2020-08-06 DIAGNOSIS — L4050 Arthropathic psoriasis, unspecified: Secondary | ICD-10-CM

## 2020-08-06 DIAGNOSIS — Z79899 Other long term (current) drug therapy: Secondary | ICD-10-CM

## 2020-08-06 NOTE — Telephone Encounter (Signed)
Released.

## 2020-08-06 NOTE — Telephone Encounter (Signed)
Patient left a voicemail requesting labwork orders be sent to [facility] in [place].  Patient requested a return call to let him know when it has been sent.

## 2020-08-09 DIAGNOSIS — L4050 Arthropathic psoriasis, unspecified: Secondary | ICD-10-CM | POA: Diagnosis not present

## 2020-08-09 DIAGNOSIS — Z79899 Other long term (current) drug therapy: Secondary | ICD-10-CM | POA: Diagnosis not present

## 2020-08-10 LAB — CBC WITH DIFFERENTIAL/PLATELET
Absolute Monocytes: 556 cells/uL (ref 200–950)
Basophils Absolute: 31 cells/uL (ref 0–200)
Basophils Relative: 0.6 %
Eosinophils Absolute: 57 cells/uL (ref 15–500)
Eosinophils Relative: 1.1 %
HCT: 49.9 % (ref 38.5–50.0)
Hemoglobin: 17 g/dL (ref 13.2–17.1)
Lymphs Abs: 2491 cells/uL (ref 850–3900)
MCH: 31.5 pg (ref 27.0–33.0)
MCHC: 34.1 g/dL (ref 32.0–36.0)
MCV: 92.6 fL (ref 80.0–100.0)
MPV: 9.8 fL (ref 7.5–12.5)
Monocytes Relative: 10.7 %
Neutro Abs: 2064 cells/uL (ref 1500–7800)
Neutrophils Relative %: 39.7 %
Platelets: 281 10*3/uL (ref 140–400)
RBC: 5.39 10*6/uL (ref 4.20–5.80)
RDW: 13.2 % (ref 11.0–15.0)
Total Lymphocyte: 47.9 %
WBC: 5.2 10*3/uL (ref 3.8–10.8)

## 2020-08-10 LAB — COMPLETE METABOLIC PANEL WITH GFR
AG Ratio: 1.7 (calc) (ref 1.0–2.5)
ALT: 42 U/L (ref 9–46)
AST: 32 U/L (ref 10–35)
Albumin: 4.3 g/dL (ref 3.6–5.1)
Alkaline phosphatase (APISO): 57 U/L (ref 35–144)
BUN: 24 mg/dL (ref 7–25)
CO2: 27 mmol/L (ref 20–32)
Calcium: 9.3 mg/dL (ref 8.6–10.3)
Chloride: 100 mmol/L (ref 98–110)
Creat: 0.88 mg/dL (ref 0.70–1.25)
GFR, Est African American: 104 mL/min/{1.73_m2} (ref >=60)
GFR, Est Non African American: 90 mL/min/{1.73_m2} (ref >=60)
Globulin: 2.6 g/dL (calc) (ref 1.9–3.7)
Glucose, Bld: 120 mg/dL (ref 65–139)
Potassium: 4.1 mmol/L (ref 3.5–5.3)
Sodium: 135 mmol/L (ref 135–146)
Total Bilirubin: 1.8 mg/dL — ABNORMAL HIGH (ref 0.2–1.2)
Total Protein: 6.9 g/dL (ref 6.1–8.1)

## 2020-08-12 NOTE — Progress Notes (Signed)
Labs faxed to Dr. Woody Seller, PCP.

## 2020-08-15 ENCOUNTER — Telehealth: Payer: Self-pay | Admitting: Rheumatology

## 2020-08-15 NOTE — Telephone Encounter (Signed)
I called patient, appt scheduled 08/16/2020

## 2020-08-15 NOTE — Telephone Encounter (Signed)
Patient having trouble with a lot of pain with right thumb. Patient has been doing a lot of yard work which has increased the pain. Patient wold like to know if he could get an injection in his thumb? Please advise.

## 2020-08-16 ENCOUNTER — Ambulatory Visit: Payer: Medicare PPO | Admitting: Rheumatology

## 2020-08-29 DIAGNOSIS — I10 Essential (primary) hypertension: Secondary | ICD-10-CM | POA: Diagnosis not present

## 2020-08-29 DIAGNOSIS — Z299 Encounter for prophylactic measures, unspecified: Secondary | ICD-10-CM | POA: Diagnosis not present

## 2020-08-29 DIAGNOSIS — R7989 Other specified abnormal findings of blood chemistry: Secondary | ICD-10-CM | POA: Diagnosis not present

## 2020-08-29 DIAGNOSIS — E1165 Type 2 diabetes mellitus with hyperglycemia: Secondary | ICD-10-CM | POA: Diagnosis not present

## 2020-08-29 DIAGNOSIS — D849 Immunodeficiency, unspecified: Secondary | ICD-10-CM | POA: Diagnosis not present

## 2020-09-27 DIAGNOSIS — Z6830 Body mass index (BMI) 30.0-30.9, adult: Secondary | ICD-10-CM | POA: Diagnosis not present

## 2020-09-27 DIAGNOSIS — R7989 Other specified abnormal findings of blood chemistry: Secondary | ICD-10-CM | POA: Diagnosis not present

## 2020-09-27 DIAGNOSIS — I10 Essential (primary) hypertension: Secondary | ICD-10-CM | POA: Diagnosis not present

## 2020-09-27 DIAGNOSIS — Z299 Encounter for prophylactic measures, unspecified: Secondary | ICD-10-CM | POA: Diagnosis not present

## 2020-09-27 DIAGNOSIS — L4050 Arthropathic psoriasis, unspecified: Secondary | ICD-10-CM | POA: Diagnosis not present

## 2020-09-27 DIAGNOSIS — E1165 Type 2 diabetes mellitus with hyperglycemia: Secondary | ICD-10-CM | POA: Diagnosis not present

## 2020-09-27 DIAGNOSIS — I2510 Atherosclerotic heart disease of native coronary artery without angina pectoris: Secondary | ICD-10-CM | POA: Diagnosis not present

## 2020-09-27 DIAGNOSIS — Z87891 Personal history of nicotine dependence: Secondary | ICD-10-CM | POA: Diagnosis not present

## 2020-09-30 DIAGNOSIS — R7989 Other specified abnormal findings of blood chemistry: Secondary | ICD-10-CM | POA: Diagnosis not present

## 2020-11-03 ENCOUNTER — Other Ambulatory Visit: Payer: Self-pay | Admitting: Rheumatology

## 2020-11-03 DIAGNOSIS — L4050 Arthropathic psoriasis, unspecified: Secondary | ICD-10-CM

## 2020-11-03 DIAGNOSIS — Z111 Encounter for screening for respiratory tuberculosis: Secondary | ICD-10-CM

## 2020-11-03 DIAGNOSIS — Z9225 Personal history of immunosupression therapy: Secondary | ICD-10-CM

## 2020-11-04 NOTE — Addendum Note (Signed)
Addended by: Carole Binning on: 11/04/2020 02:30 PM   Modules accepted: Orders

## 2020-11-04 NOTE — Telephone Encounter (Signed)
Patient advised to come in for labs which should include CBC, CMP and TB Gold. Patient states he will go this week or next to have them done.

## 2020-11-04 NOTE — Telephone Encounter (Signed)
Please advise patient to come in for labs which should include CBC, CMP and TB Gold.

## 2020-11-04 NOTE — Telephone Encounter (Signed)
Last Visit: 07/09/2020 Next Visit: 12/03/2020 Labs: 08/09/2020 Total bilirubin borderline elevated-1.8.  rest of CMP WNL.  CBC WNL TB Gold: 10/31/2019 Neg    Current Dose per office note 07/09/2020: Humira 40 mg sq injections every 14 days  DX: Psoriatic arthropathy    Last Fill: 05/20/2021  Okay to refill Humira?

## 2020-11-06 ENCOUNTER — Telehealth: Payer: Self-pay

## 2020-11-06 ENCOUNTER — Other Ambulatory Visit: Payer: Self-pay | Admitting: *Deleted

## 2020-11-06 DIAGNOSIS — Z79899 Other long term (current) drug therapy: Secondary | ICD-10-CM

## 2020-11-06 DIAGNOSIS — L4050 Arthropathic psoriasis, unspecified: Secondary | ICD-10-CM

## 2020-11-06 DIAGNOSIS — Z9225 Personal history of immunosupression therapy: Secondary | ICD-10-CM

## 2020-11-06 DIAGNOSIS — Z111 Encounter for screening for respiratory tuberculosis: Secondary | ICD-10-CM

## 2020-11-06 NOTE — Telephone Encounter (Signed)
Patient called requesting labwork orders be sent to [facility] in [place].  Patient plans to go tomorrow, 11/07/20

## 2020-11-06 NOTE — Telephone Encounter (Signed)
Lab orders released.  

## 2020-11-07 DIAGNOSIS — Z79899 Other long term (current) drug therapy: Secondary | ICD-10-CM | POA: Diagnosis not present

## 2020-11-07 DIAGNOSIS — Z9225 Personal history of immunosupression therapy: Secondary | ICD-10-CM | POA: Diagnosis not present

## 2020-11-07 DIAGNOSIS — Z111 Encounter for screening for respiratory tuberculosis: Secondary | ICD-10-CM | POA: Diagnosis not present

## 2020-11-07 DIAGNOSIS — L4050 Arthropathic psoriasis, unspecified: Secondary | ICD-10-CM | POA: Diagnosis not present

## 2020-11-08 NOTE — Progress Notes (Signed)
Glucose is elevated.  ALT is elevated.  ALT elevation is most likely related to methotrexate and statin use.  We will continue to monitor.  If ALT stays elevated we may have to reduce the dose of methotrexate.  CBC is normal.  Please forward labs to his PCP.

## 2020-11-10 LAB — CBC WITH DIFFERENTIAL/PLATELET
Absolute Monocytes: 295 cells/uL (ref 200–950)
Basophils Absolute: 20 cells/uL (ref 0–200)
Basophils Relative: 0.4 %
Eosinophils Absolute: 70 cells/uL (ref 15–500)
Eosinophils Relative: 1.4 %
HCT: 50.1 % — ABNORMAL HIGH (ref 38.5–50.0)
Hemoglobin: 16.6 g/dL (ref 13.2–17.1)
Lymphs Abs: 2315 cells/uL (ref 850–3900)
MCH: 31.1 pg (ref 27.0–33.0)
MCHC: 33.1 g/dL (ref 32.0–36.0)
MCV: 94 fL (ref 80.0–100.0)
MPV: 10 fL (ref 7.5–12.5)
Monocytes Relative: 5.9 %
Neutro Abs: 2300 cells/uL (ref 1500–7800)
Neutrophils Relative %: 46 %
Platelets: 224 10*3/uL (ref 140–400)
RBC: 5.33 10*6/uL (ref 4.20–5.80)
RDW: 13.6 % (ref 11.0–15.0)
Total Lymphocyte: 46.3 %
WBC: 5 10*3/uL (ref 3.8–10.8)

## 2020-11-10 LAB — COMPLETE METABOLIC PANEL WITH GFR
AG Ratio: 1.4 (calc) (ref 1.0–2.5)
ALT: 59 U/L — ABNORMAL HIGH (ref 9–46)
AST: 34 U/L (ref 10–35)
Albumin: 4.2 g/dL (ref 3.6–5.1)
Alkaline phosphatase (APISO): 61 U/L (ref 35–144)
BUN: 25 mg/dL (ref 7–25)
CO2: 30 mmol/L (ref 20–32)
Calcium: 9.9 mg/dL (ref 8.6–10.3)
Chloride: 99 mmol/L (ref 98–110)
Creat: 0.87 mg/dL (ref 0.70–1.25)
GFR, Est African American: 104 mL/min/{1.73_m2} (ref >=60)
GFR, Est Non African American: 90 mL/min/{1.73_m2} (ref >=60)
Globulin: 2.9 g/dL (calc) (ref 1.9–3.7)
Glucose, Bld: 228 mg/dL — ABNORMAL HIGH (ref 65–139)
Potassium: 4.3 mmol/L (ref 3.5–5.3)
Sodium: 136 mmol/L (ref 135–146)
Total Bilirubin: 1.2 mg/dL (ref 0.2–1.2)
Total Protein: 7.1 g/dL (ref 6.1–8.1)

## 2020-11-10 LAB — QUANTIFERON-TB GOLD PLUS
Mitogen-NIL: >10 IU/mL
NIL: 0.03 IU/mL
QuantiFERON-TB Gold Plus: NEGATIVE
TB1-NIL: 0 IU/mL
TB2-NIL: 0 IU/mL

## 2020-11-11 NOTE — Progress Notes (Signed)
TB Gold is negative.

## 2020-11-14 ENCOUNTER — Ambulatory Visit: Payer: Medicare PPO | Admitting: Podiatry

## 2020-11-14 ENCOUNTER — Encounter: Payer: Self-pay | Admitting: Podiatry

## 2020-11-14 ENCOUNTER — Other Ambulatory Visit: Payer: Self-pay

## 2020-11-14 DIAGNOSIS — B351 Tinea unguium: Secondary | ICD-10-CM | POA: Diagnosis not present

## 2020-11-15 NOTE — Progress Notes (Signed)
Subjective:   Patient ID: Brandon Frazier, male   DOB: 65 y.o.   MRN: [id number]   HPI Patient is concerned about discoloration around his nailbeds and a right amount of skin between the fourth and fifth toes of the right foot with no pain or itching   ROS      Objective:  Physical Exam  Neurovascular status intact with several nails on the right foot exhibiting distal discoloration with slight lifting of the hallux nail right and white discoloration fourth interspace right foot     Assessment:  Low-grade trauma to the nailbeds right with mycotic component along with possible low-grade fungal infection right nonsymptomatic     Plan:  H&P reviewed both conditions and he will start Lamisil cream between the fourth and fifth digits does not want oral medication and we will keep an eye on this and it should be uneventful but I did educate him on trauma versus fungal infection

## 2020-11-19 NOTE — Progress Notes (Signed)
Office Visit Note  Patient: Brandon Frazier             Date of Birth: 16-Jul-1955           MRN: [id number]             PCP: Ignatius Specking, MD Referring: Ignatius Specking, MD Visit Date: 12/03/2020 Occupation: @GUAROCC @  Subjective:  Bilateral thumb pain   History of Present Illness: Brandon Frazier is a 65 y.o. male with history of psoriatic arthritis.  He is on humira 40 mg sq injections every 14 days, methotrexate 0.8 ml sq injections once weekly, and folic acid 2 mg daily.  He has not missed any doses of Humira or methotrexate recently.  Patient reports that he had a flare in May 2022 and was evaluated by a PA at his PCPs office.  He states he was given a prednisone taper which improved his symptoms.  He continues to have intermittent pain in both thumbs and morning stiffness lasting about 30 minutes daily.  He has been wearing a compression right wrist brace at night which alleviates some of his discomfort.  He has also been applying Voltaren gel topically as needed for pain relief.  He denies any joint swelling at this time.  He states both knee replacements are doing well.  He has been getting in the swimming pool on a daily basis for exercise.  He does not feel as though he has increased joint pain or inflammation leading up to his Humira or methotrexate injections. He denies any recent infections.     Activities of Daily Living:  Patient reports morning stiffness for 30 minutes.   Patient Denies nocturnal pain.  Difficulty dressing/grooming: Denies Difficulty climbing stairs: Denies Difficulty getting out of chair: Denies Difficulty using hands for taps, buttons, cutlery, and/or writing: Denies  Review of Systems  Constitutional:  Negative for fatigue.  HENT:  Negative for mouth sores, mouth dryness and nose dryness.   Eyes:  Negative for pain, itching and dryness.  Respiratory:  Negative for shortness of breath and difficulty breathing.   Cardiovascular:  Negative for chest pain  and palpitations.  Gastrointestinal:  Negative for blood in stool, constipation and diarrhea.  Endocrine: Negative for increased urination.  Genitourinary:  Negative for difficulty urinating.  Musculoskeletal:  Positive for joint pain, joint pain, joint swelling, myalgias, morning stiffness, muscle tenderness and myalgias.  Skin:  Negative for color change, rash and redness.  Allergic/Immunologic: Negative for susceptible to infections.  Neurological:  Negative for dizziness, numbness, headaches, memory loss and weakness.  Hematological:  Positive for bruising/bleeding tendency.  Psychiatric/Behavioral:  Negative for confusion.    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   Psoriasis 12/10/2010   Knee pain, bilateral 10/30/2010   Hip pain, bilateral 10/30/2010   Leg length discrepancy 10/30/2010    Past Medical History:  Diagnosis Date   Anterior myocardial infarction (HCC) 1995   Anxiety    CAD (coronary artery disease)    Angioplasty of LAD 1995   Essential hypertension    GERD (gastroesophageal  reflux disease)    Psoriatic arthritis (HCC)    Type 2 diabetes mellitus (HCC)     Family History  Problem Relation Age of Onset   GER disease Mother    Heart disease Mother    Liver cancer Father    Arthritis Son    Psoriasis Son    Psoriasis Daughter    Past Surgical History:  Procedure Laterality Date   CARDIAC CATHETERIZATION     1995, 2001   COLONOSCOPY     COLONOSCOPY  07/04/2020   EYE SURGERY Bilateral    lasik   INSERTION OF MESH  N/A 04/23/2016   Procedure: INSERTION OF MESH;  Surgeon: Ovidio Kin, MD;  Location: [facility] OR;  Service: General;  Laterality: N/A;   KNEE ARTHROPLASTY     left knee arthroscopy     1990, 1997   MEDIAL PARTIAL KNEE REPLACEMENT Right 05/2019   PARTIAL KNEE ARTHROPLASTY Left 02/16/2014   Procedure: LEFT KNEE UNI ARTHROPLASTY;  Surgeon: Eulas Post, MD;  Location: [facility];  Service: Orthopedics;  Laterality: Left;   UMBILICAL HERNIA REPAIR N/A 04/23/2016   Procedure: LAPAROSCOPIC UMBILICAL HERNIA;  Surgeon: Ovidio Kin, MD;  Location: [facility] OR;  Service: General;  Laterality: N/A;   Social History   Social History Narrative   Not on file   Immunization History  Administered Date(s) Administered   Influenza,inj,Quad PF,6+ Mos 03/19/2017, 03/24/2018   Tdap 05/14/2018     Objective: Vital Signs: BP 132/81 (BP Location: Left Arm, Patient Position: Sitting, Cuff Size: Normal)   Pulse (!) 58   Resp 16   Ht 5\' 8"  (1.727 m)   Wt 182 lb (82.6 kg)   BMI 27.67 kg/m    Physical Exam Vitals and nursing note reviewed.  Constitutional:      Appearance: He is well-developed.  HENT:     Head: Normocephalic and atraumatic.  Eyes:     Conjunctiva/sclera: Conjunctivae normal.     Pupils: Pupils are equal, round, and reactive to light.  Pulmonary:     Effort: Pulmonary effort is normal.  Abdominal:     Palpations: Abdomen is soft.  Musculoskeletal:     Cervical back: Normal range of motion and neck supple.  Skin:    General: Skin is warm and dry.     Capillary Refill: Capillary refill takes less than 2 seconds.  Neurological:     Mental Status: He is alert and oriented to person, place, and time.  Psychiatric:        Behavior: Behavior normal.     Musculoskeletal Exam: C-spine is limited range of motion with lateral rotation.  Thoracic and lumbar spine have good range of motion with no discomfort.  No midline spinal tenderness or SI joint tenderness noted.  Some  discomfort in the paraspinal muscles bilaterally.  Trapezius muscle tension and tenderness noted bilaterally.  Shoulder joints, elbow joints, wrist joints, MCPs, PIPs, DIPs have good range of motion with no synovitis.  PIP and DIP thickening consistent with osteoarthritis of both hands noted.  Tenderness over bilateral CMC joints.  Thickening over the first MCP joint bilaterally.  Complete fist formation bilaterally.  Hip joints have good range of motion with no discomfort.  Bilateral knee replacements have good range of motion with no warmth or effusion.  Ankle joints have good range of motion with no tenderness or joint swelling.  No evidence of Achilles tendinitis or plantar fasciitis.  CDAI Exam: CDAI Score: -- Patient Global: --; Provider Global: --  Swollen: --; Tender: -- Joint Exam 12/03/2020   No joint exam has been documented for this visit   There is currently no information documented on the homunculus. Go to the Rheumatology activity and complete the homunculus joint exam.  Investigation: No additional findings.  Imaging: No results found.  Recent Labs: Lab Results  Component Value Date   WBC 5.0 11/07/2020   HGB 16.6 11/07/2020   PLT 224 11/07/2020   NA 136 11/07/2020   K 4.3 11/07/2020   CL 99 11/07/2020   CO2 30 11/07/2020   GLUCOSE 228 (H) 11/07/2020   BUN 25 11/07/2020   CREATININE 0.87 11/07/2020   BILITOT 1.2 11/07/2020   AST 34 11/07/2020   ALT 59 (H) 11/07/2020   PROT 7.1 11/07/2020   CALCIUM 9.9 11/07/2020   GFRAA 104 11/07/2020   QFTBGOLDPLUS NEGATIVE 11/07/2020    Speciality Comments: No specialty comments available.  Procedures:  No procedures performed Allergies: Aspirin and Codeine   Assessment / Plan:     Visit Diagnoses: Psoriatic arthropathy (HCC) - He has no synovitis or dactylitis on examination today.  According to the patient he had a flare and was examined by his PCP on 09/22/2020.  He was given a prednisone taper which resolved his  generalized pain and stiffness.  He continues to have intermittent pain in both thumbs.  On examination today he has tenderness over bilateral CMC joints and synovial thickening over the first MCP joint bilaterally.  He was able to make a complete fist with both hands.  We discussed the use of arthritis compression gloves as well as the use of Voltaren gel which she can apply topically as needed for pain relief.  We discussed the importance of joint protection and muscle strengthening.  He has no evidence of Achilles tendinitis or plantar fasciitis.  He has no SI joint tenderness to palpation.  No active psoriasis was noted at this time.  Overall he is clinically doing well on Humira 40 mg subcu days injections every 14 days, methotrexate 0.8 mL sq injections once weekly, and folic acid 2 mg by mouth daily.  He does not feel as though he has breakthrough pain or inflammation leading up to his injections.  According to the patient he flares about once a year.  X-rays of both hands and feet were updated today to assess for radiographic progression.  He will remain on the current treatment regimen.  A refill of Humira will be sent to the pharmacy.  He was advised to notify us if he develops increased joint pain or joint swelling.  He will follow-up in the office in 5 months.  Plan: XR Hand 2 View Left, XR Hand 2 View Right, XR Foot 2 Views Right, XR Foot 2 Views Left  Psoriasis: He has no active psoriasis at this time.  High risk medication use - Humira 40 mg sq injections every 14 days, MTX 0.8 ml sq once weekly, and folic acid 2 mg daily. CBC and CMP updated on 11/07/20.  He will be due to update lab work in September and every 3 months.  Standing orders for CBC and CMP are in place.  TB gold negative on 11/07/20.   He has not had any recent infections.  We discussed the importance of holding Humira and methotrexate if he develops signs or symptoms of an infection and to resume once infection has completely  cleared.  Acute midline low back pain without sciatica: He has occasional discomfort in his lower back.  He has some tenderness over the paraspinal muscles in the lumbar region bilaterally.  He is not experiencing any symptoms of sciatica at this time.  He has been getting massage every 2 weeks which has been alleviating his discomfort.  Status post left partial knee replacement: Doing well.  Good range of motion with no discomfort at this time.  No warmth or effusion was noted.  He has been exercising in the pool on a daily basis.  Status post right partial knee replacement: He has good range of motion with no discomfort at this time.  No warmth or effusion was noted.  Other medical conditions are listed as follows:   History of coronary artery disease  History of hyperlipidemia  History of diabetes mellitus  History of hypertension  COVID-19 virus infection - 02/2019.  Orders: Orders Placed This Encounter  Procedures   XR Hand 2 View Left   XR Hand 2 View Right   XR Foot 2 Views Right   XR Foot 2 Views Left   No orders of the defined types were placed in this encounter.   Follow-Up Instructions: Return in about 5 months (around 05/05/2021) for Psoriatic arthritis.   Gearldine Bienenstock, PA-C  Note - This record has been created using Dragon software.  Chart creation errors have been sought, but may not always  have been located. Such creation errors do not reflect on  the standard of medical care.

## 2020-12-02 DIAGNOSIS — I10 Essential (primary) hypertension: Secondary | ICD-10-CM | POA: Diagnosis not present

## 2020-12-02 DIAGNOSIS — Z299 Encounter for prophylactic measures, unspecified: Secondary | ICD-10-CM | POA: Diagnosis not present

## 2020-12-02 DIAGNOSIS — E1165 Type 2 diabetes mellitus with hyperglycemia: Secondary | ICD-10-CM | POA: Diagnosis not present

## 2020-12-02 DIAGNOSIS — K0389 Other specified diseases of hard tissues of teeth: Secondary | ICD-10-CM | POA: Diagnosis not present

## 2020-12-02 DIAGNOSIS — L4050 Arthropathic psoriasis, unspecified: Secondary | ICD-10-CM | POA: Diagnosis not present

## 2020-12-03 ENCOUNTER — Ambulatory Visit: Payer: Self-pay

## 2020-12-03 ENCOUNTER — Other Ambulatory Visit: Payer: Self-pay

## 2020-12-03 ENCOUNTER — Ambulatory Visit: Payer: Medicare PPO | Admitting: Physician Assistant

## 2020-12-03 ENCOUNTER — Other Ambulatory Visit: Payer: Self-pay | Admitting: Rheumatology

## 2020-12-03 ENCOUNTER — Encounter: Payer: Self-pay | Admitting: Physician Assistant

## 2020-12-03 VITALS — BP 132/81 | HR 58 | Resp 16 | Ht 68.0 in | Wt 182.0 lb

## 2020-12-03 DIAGNOSIS — M79672 Pain in left foot: Secondary | ICD-10-CM | POA: Diagnosis not present

## 2020-12-03 DIAGNOSIS — M79671 Pain in right foot: Secondary | ICD-10-CM

## 2020-12-03 DIAGNOSIS — Z96651 Presence of right artificial knee joint: Secondary | ICD-10-CM | POA: Diagnosis not present

## 2020-12-03 DIAGNOSIS — L409 Psoriasis, unspecified: Secondary | ICD-10-CM | POA: Diagnosis not present

## 2020-12-03 DIAGNOSIS — Z8679 Personal history of other diseases of the circulatory system: Secondary | ICD-10-CM

## 2020-12-03 DIAGNOSIS — Z79899 Other long term (current) drug therapy: Secondary | ICD-10-CM | POA: Diagnosis not present

## 2020-12-03 DIAGNOSIS — Z96652 Presence of left artificial knee joint: Secondary | ICD-10-CM

## 2020-12-03 DIAGNOSIS — M79641 Pain in right hand: Secondary | ICD-10-CM | POA: Diagnosis not present

## 2020-12-03 DIAGNOSIS — M79642 Pain in left hand: Secondary | ICD-10-CM

## 2020-12-03 DIAGNOSIS — U071 COVID-19: Secondary | ICD-10-CM

## 2020-12-03 DIAGNOSIS — Z8639 Personal history of other endocrine, nutritional and metabolic disease: Secondary | ICD-10-CM

## 2020-12-03 DIAGNOSIS — M545 Low back pain, unspecified: Secondary | ICD-10-CM | POA: Diagnosis not present

## 2020-12-03 DIAGNOSIS — L4050 Arthropathic psoriasis, unspecified: Secondary | ICD-10-CM

## 2020-12-03 MED ORDER — HUMIRA (2 PEN) 40 MG/0.4ML ~~LOC~~ AJKT: 40.0000 mg | AUTO-INJECTOR | SUBCUTANEOUS | 0 refills | Status: DC

## 2020-12-03 NOTE — Telephone Encounter (Signed)
Humira refill requested today at patient's appointment. Please review and send to the pharmacy. Thanks!

## 2020-12-03 NOTE — Patient Instructions (Signed)
Standing Labs We placed an order today for your standing lab work.   Please have your standing labs drawn in September and every 3 months  If possible, please have your labs drawn 2 weeks prior to your appointment so that the provider can discuss your results at your appointment.  Please note that you may see your imaging and lab results in MyChart before we have reviewed them. We may be awaiting multiple results to interpret others before contacting you. Please allow our office up to 72 hours to thoroughly review all of the results before contacting the office for clarification of your results.  We have open lab daily: Monday through Thursday from 1:30-4:30 PM and Friday from 1:30-4:00 PM at the office of Dr. Shaili Deveshwar, [facility].   Please be advised, all patients with office appointments requiring lab work will take precedent over walk-in lab work.  If possible, please come for your lab work on Monday and Friday afternoons, as you may experience shorter wait times. The office is located at [address], [address], [place], [place] [address] No appointment is necessary.   Labs are drawn by [facility]. Please bring your co-pay at the time of your lab draw.  You may receive a bill from [facility] for your lab work.  If you wish to have your labs drawn at another location, please call the office 24 hours in advance to send orders.  If you have any questions regarding directions or hours of operation,  please call [phone].   As a reminder, please drink plenty of water prior to coming for your lab work. Thanks! 

## 2020-12-03 NOTE — Telephone Encounter (Signed)
Next Visit: 05/06/2021  Last Visit: 12/03/2020  Last Fill: 01/26/2020  Dx: Psoriatic arthropathy  Current Dose per office note on 2/76/1848: folic acid 2 mg daily  Per protocol, okay to refill per Dr. Estanislado Pandy

## 2020-12-03 NOTE — Progress Notes (Signed)
Please notify the patient of x-ray results.  X-rays of both hands and both feet were consistent with osteoarthritic changes.  No erosive changes noted.  No previous x-rays were available for comparison.  He should have repeat x-rays in 2-3 years.

## 2021-03-08 ENCOUNTER — Other Ambulatory Visit: Payer: Self-pay | Admitting: Physician Assistant

## 2021-03-08 DIAGNOSIS — L4050 Arthropathic psoriasis, unspecified: Secondary | ICD-10-CM

## 2021-03-08 DIAGNOSIS — Z79899 Other long term (current) drug therapy: Secondary | ICD-10-CM

## 2021-03-10 NOTE — Telephone Encounter (Signed)
Next Visit: 05/06/2021   Last Visit: 12/03/2020   Last Fill: 12/03/2020  Dx: Psoriatic arthropathy   Current Dose per office note on 12/03/2020:Humira 40 mg sq injections every 14 days  Labs: 11/07/2020 Glucose is elevated.  ALT is elevated.  ALT elevation is most likely related to methotrexate and statin use.  TB Gold: 11/07/2020 Neg   Patient advised he is due to update labs. Patient will update them today or tomorrow. Lab Orders released.   Okay to refill Humira?

## 2021-03-11 DIAGNOSIS — Z79899 Other long term (current) drug therapy: Secondary | ICD-10-CM | POA: Diagnosis not present

## 2021-03-11 DIAGNOSIS — L4050 Arthropathic psoriasis, unspecified: Secondary | ICD-10-CM | POA: Diagnosis not present

## 2021-03-12 DIAGNOSIS — I10 Essential (primary) hypertension: Secondary | ICD-10-CM | POA: Diagnosis not present

## 2021-03-12 DIAGNOSIS — Z2821 Immunization not carried out because of patient refusal: Secondary | ICD-10-CM | POA: Diagnosis not present

## 2021-03-12 DIAGNOSIS — Z299 Encounter for prophylactic measures, unspecified: Secondary | ICD-10-CM | POA: Diagnosis not present

## 2021-03-12 DIAGNOSIS — Z6829 Body mass index (BMI) 29.0-29.9, adult: Secondary | ICD-10-CM | POA: Diagnosis not present

## 2021-03-12 DIAGNOSIS — E1165 Type 2 diabetes mellitus with hyperglycemia: Secondary | ICD-10-CM | POA: Diagnosis not present

## 2021-03-12 LAB — CBC WITH DIFFERENTIAL/PLATELET
Absolute Monocytes: 384 cells/uL (ref 200–950)
Basophils Absolute: 41 cells/uL (ref 0–200)
Basophils Relative: 0.7 %
Eosinophils Absolute: 148 cells/uL (ref 15–500)
Eosinophils Relative: 2.5 %
HCT: 46.9 % (ref 38.5–50.0)
Hemoglobin: 15.7 g/dL (ref 13.2–17.1)
Lymphs Abs: 3098 cells/uL (ref 850–3900)
MCH: 32.9 pg (ref 27.0–33.0)
MCHC: 33.5 g/dL (ref 32.0–36.0)
MCV: 98.3 fL (ref 80.0–100.0)
MPV: 10.1 fL (ref 7.5–12.5)
Monocytes Relative: 6.5 %
Neutro Abs: 2230 cells/uL (ref 1500–7800)
Neutrophils Relative %: 37.8 %
Platelets: 239 10*3/uL (ref 140–400)
RBC: 4.77 10*6/uL (ref 4.20–5.80)
RDW: 11.9 % (ref 11.0–15.0)
Total Lymphocyte: 52.5 %
WBC: 5.9 10*3/uL (ref 3.8–10.8)

## 2021-03-12 LAB — COMPLETE METABOLIC PANEL WITH GFR
AG Ratio: 1.4 (calc) (ref 1.0–2.5)
ALT: 38 U/L (ref 9–46)
AST: 28 U/L (ref 10–35)
Albumin: 4.2 g/dL (ref 3.6–5.1)
Alkaline phosphatase (APISO): 59 U/L (ref 35–144)
BUN: 25 mg/dL (ref 7–25)
CO2: 26 mmol/L (ref 20–32)
Calcium: 9.4 mg/dL (ref 8.6–10.3)
Chloride: 100 mmol/L (ref 98–110)
Creat: 0.85 mg/dL (ref 0.70–1.35)
Globulin: 2.9 g/dL (calc) (ref 1.9–3.7)
Glucose, Bld: 191 mg/dL — ABNORMAL HIGH (ref 65–139)
Potassium: 4.3 mmol/L (ref 3.5–5.3)
Sodium: 136 mmol/L (ref 135–146)
Total Bilirubin: 0.7 mg/dL (ref 0.2–1.2)
Total Protein: 7.1 g/dL (ref 6.1–8.1)
eGFR: 95 mL/min/{1.73_m2} (ref >=60)

## 2021-04-22 NOTE — Progress Notes (Signed)
Office Visit Note  Patient: Brandon Frazier             Date of Birth: 1955-10-19           MRN: [id number]             PCP: Ignatius Specking, MD Referring: Ignatius Specking, MD Visit Date: 05/06/2021 Occupation: @GUAROCC @  Subjective:  Medication management.   History of Present Illness: KAMAREE FINAMORE is a 65 y.o. male with a history of psoriatic arthritis and osteoarthritis overlap.  He states he developed flu symptoms on Friday.  His daughter was positive for influenza A.  He states he was started on Tamiflu and also amoxicillin for sinus infection.  He stopped methotrexate and Humira.  He states he had been doing well on the combination of Humira and methotrexate.  He denies any joint swelling.  He has been experiencing some discomfort in his right thumb he relates to osteoarthritis.  He denies having any psoriasis lesions.  His bilateral total knee replacements are doing well.  He has some discomfort in the calcaneal spurs at times.  Activities of Daily Living:  Patient reports morning stiffness for 5 minutes.   Patient Denies nocturnal pain.  Difficulty dressing/grooming: Denies Difficulty climbing stairs: Denies Difficulty getting out of chair: Denies Difficulty using hands for taps, buttons, cutlery, and/or writing: Denies  Review of Systems  Constitutional:  Negative for fatigue.  HENT:  Negative for mouth sores, mouth dryness and nose dryness.   Eyes:  Negative for pain, itching and dryness.  Respiratory:  Negative for shortness of breath and difficulty breathing.   Cardiovascular:  Negative for chest pain and palpitations.  Gastrointestinal:  Negative for blood in stool, constipation and diarrhea.  Endocrine: Negative for increased urination.  Genitourinary:  Negative for difficulty urinating.  Musculoskeletal:  Positive for joint pain, joint pain and morning stiffness. Negative for joint swelling, myalgias, muscle tenderness and myalgias.  Skin:  Negative for color change,  rash and redness.  Allergic/Immunologic: Negative for susceptible to infections.  Neurological:  Negative for dizziness, numbness, headaches, memory loss and weakness.  Hematological:  Positive for bruising/bleeding tendency.  Psychiatric/Behavioral:  Negative for confusion.    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   Psoriasis 12/10/2010   Knee pain, bilateral 10/30/2010   Hip pain, bilateral 10/30/2010   Leg length discrepancy 10/30/2010    Past Medical History:  Diagnosis Date   Anterior myocardial infarction (HCC) 1995   Anxiety    CAD (coronary artery disease)    Angioplasty of LAD 1995   Essential hypertension    GERD (gastroesophageal reflux disease)    Psoriatic arthritis (HCC)    Type 2 diabetes mellitus (HCC)     Family History  Problem Relation Age of Onset   GER disease Mother    Heart disease Mother    Liver cancer Father    Arthritis Son    Psoriasis Son    Psoriasis Daughter    Past Surgical History:  Procedure Laterality Date   CARDIAC CATHETERIZATION  1995, 2001   COLONOSCOPY     COLONOSCOPY  07/04/2020   EYE SURGERY Bilateral    lasik   INSERTION OF MESH N/A 04/23/2016   Procedure: INSERTION OF MESH;  Surgeon: Ovidio Kin, MD;  Location[facility];  Service: General;  Laterality: N/A;   KNEE ARTHROPLASTY     left knee arthroscopy     1990, 1997   MEDIAL PARTIAL KNEE REPLACEMENT Right 05/2019   PARTIAL KNEE ARTHROPLASTY Left 02/16/2014   Procedure: LEFT KNEE UNI ARTHROPLASTY;  Surgeon: Eulas Post, MD;   Location: [facility];  Service: Orthopedics;  Laterality: Left;   UMBILICAL HERNIA REPAIR N/A 04/23/2016   Procedure: LAPAROSCOPIC UMBILICAL HERNIA;  Surgeon: Ovidio Kin, MD;  Location: [facility] OR;  Service: General;  Laterality: N/A;   Social History   Social History Narrative   Not on file   Immunization History  Administered Date(s) Administered   Influenza,inj,Quad PF,6+ Mos 03/19/2017, 03/24/2018   Tdap 05/14/2018     Objective: Vital Signs: BP (!) 141/88 (BP Location: Left Arm, Patient Position: Sitting, Cuff Size: Normal)   Pulse 60   Ht 5\' 8"  (1.727 m)   Wt 194 lb 3.2 oz (88.1 kg)   BMI 29.53 kg/m    Physical Exam Vitals and nursing note reviewed.  Constitutional:      Appearance: He is well-developed.  HENT:     Head: Normocephalic and atraumatic.  Eyes:     Conjunctiva/sclera: Conjunctivae normal.     Pupils: Pupils are equal, round, and reactive to light.  Cardiovascular:     Rate and Rhythm: Normal rate and regular rhythm.     Heart sounds: Normal heart sounds.  Pulmonary:     Effort: Pulmonary effort is normal.     Breath sounds: Normal breath sounds.  Abdominal:     General: Bowel sounds are normal.     Palpations: Abdomen is soft.  Musculoskeletal:     Cervical back: Normal range of motion and neck supple.  Skin:    General: Skin is warm and dry.     Capillary Refill: Capillary refill takes less than 2 seconds.  Neurological:     Mental Status: He is alert and oriented to person, place, and time.  Psychiatric:        Behavior: Behavior normal.     Musculoskeletal Exam: C-spine thoracic and lumbar spine were good range of motion.  He had no SI joint tenderness.  Shoulder joints, elbow joints, wrist joints with good range of motion.  He had bilateral PIP and DIP thickening.  He had thickening of the right first MCP joint without any synovitis.  Hip joints are in good range of motion.  Bilateral knee joints were replaced without any effusion  or warmth.  There was no tenderness over ankles or MTPs.  CDAI Exam: CDAI Score: -- Patient Global: --; Provider Global: -- Swollen: --; Tender: -- Joint Exam 05/06/2021   No joint exam has been documented for this visit   There is currently no information documented on the homunculus. Go to the Rheumatology activity and complete the homunculus joint exam.  Investigation: No additional findings.  Imaging: No results found.  Recent Labs: Lab Results  Component Value Date   WBC 5.9 03/11/2021   HGB 15.7 03/11/2021   PLT 239 03/11/2021   NA 136 03/11/2021   K 4.3 03/11/2021   CL 100 03/11/2021   CO2 26 03/11/2021   GLUCOSE 191 (H) 03/11/2021   BUN 25 03/11/2021  CREATININE 0.85 03/11/2021   BILITOT 0.7 03/11/2021   AST 28 03/11/2021   ALT 38 03/11/2021   PROT 7.1 03/11/2021   CALCIUM 9.4 03/11/2021   GFRAA 104 11/07/2020   QFTBGOLDPLUS NEGATIVE 11/07/2020    Speciality Comments: No specialty comments available.  Procedures:  No procedures performed Allergies: Aspirin and Codeine   Assessment / Plan:     Visit Diagnoses: Psoriatic arthropathy (HCC)-he denies any increased joint pain or swelling.  He has been tolerating medications well.  He had to stop Humira and methotrexate due to recent infection most likely flu.  He is on Tamiflu and amoxicillin currently.  He has been taking Humira and methotrexate on a regular basis and is stopped both medications because of the infection.  Psoriasis-he had no active psoriasis lesions.  High risk medication use - Humira 40 mg sq injections every 14 days, MTX 0.8 ml sq once weekly, and folic acid 2 mg daily.  Labs obtained on March 11, 2021 were within normal limits which included CBC with differential and CMP with GFR.  TB Gold on November 07, 2020 was negative.  He has been advised to get labs every 3 months to monitor for drug toxicity.  He was advised to hold Humira and methotrexate if he develops an infection and resume after  the infection resolves.  Information regarding immunization was also placed in the AVS.  He has been advised to get annual skin examination to screen for nonmelanoma skin cancer by dermatologist while he is on Humira.  Primary osteoarthritis of both hands-he has osteoarthritis in his bilateral hands.  He has been having discomfort in his right first PIP joint.  No synovitis was noted.  Joint protection muscle strengthening was discussed.  Status post left partial knee replacement-doing well  Status post right partial knee replacement-doing well he had good range of motion without discomfort.  History of hyperlipidemia-he is on Pravachol.  History of coronary artery disease-he is on Plavix.  Increased risk of heart disease with psoriatic arthritis was discussed.  Dietary modifications and exercise was emphasized.  He states he has not been exercising much recently.  He will start exercising again in January.  History of hypertension-his blood pressure was elevated today.  Have advised him to monitor blood pressure closely and follow-up with his PCP.  He is on BenzePrO and HCTZ.  History of diabetes mellitus-patient states his blood glucose levels are fairly well controlled.  Upper respiratory tract infection-he was exposed to his daughter who had influenza A.  He is currently on Tamiflu and amoxicillin for viral infection and sinusitis.   Orders: No orders of the defined types were placed in this encounter.  No orders of the defined types were placed in this encounter.    Follow-Up Instructions: Return in about 5 months (around 10/04/2021) for Psoriatic arthritis.   Pollyann Savoy, MD  Note - This record has been created using Animal nutritionist.  Chart creation errors have been sought, but may not always  have been located. Such creation errors do not reflect on  the standard of medical care.

## 2021-04-24 DIAGNOSIS — Z6829 Body mass index (BMI) 29.0-29.9, adult: Secondary | ICD-10-CM | POA: Diagnosis not present

## 2021-04-24 DIAGNOSIS — Z79899 Other long term (current) drug therapy: Secondary | ICD-10-CM | POA: Diagnosis not present

## 2021-04-24 DIAGNOSIS — R7989 Other specified abnormal findings of blood chemistry: Secondary | ICD-10-CM | POA: Diagnosis not present

## 2021-04-24 DIAGNOSIS — E039 Hypothyroidism, unspecified: Secondary | ICD-10-CM | POA: Diagnosis not present

## 2021-04-24 DIAGNOSIS — Z7189 Other specified counseling: Secondary | ICD-10-CM | POA: Diagnosis not present

## 2021-04-24 DIAGNOSIS — Z1331 Encounter for screening for depression: Secondary | ICD-10-CM | POA: Diagnosis not present

## 2021-04-24 DIAGNOSIS — Z1339 Encounter for screening examination for other mental health and behavioral disorders: Secondary | ICD-10-CM | POA: Diagnosis not present

## 2021-04-24 DIAGNOSIS — E7800 Pure hypercholesterolemia, unspecified: Secondary | ICD-10-CM | POA: Diagnosis not present

## 2021-04-24 DIAGNOSIS — Z87891 Personal history of nicotine dependence: Secondary | ICD-10-CM | POA: Diagnosis not present

## 2021-04-24 DIAGNOSIS — R5383 Other fatigue: Secondary | ICD-10-CM | POA: Diagnosis not present

## 2021-04-24 DIAGNOSIS — Z299 Encounter for prophylactic measures, unspecified: Secondary | ICD-10-CM | POA: Diagnosis not present

## 2021-04-24 DIAGNOSIS — Z0000 Encounter for general adult medical examination without abnormal findings: Secondary | ICD-10-CM | POA: Diagnosis not present

## 2021-04-24 DIAGNOSIS — I10 Essential (primary) hypertension: Secondary | ICD-10-CM | POA: Diagnosis not present

## 2021-04-24 DIAGNOSIS — Z125 Encounter for screening for malignant neoplasm of prostate: Secondary | ICD-10-CM | POA: Diagnosis not present

## 2021-04-25 ENCOUNTER — Other Ambulatory Visit: Payer: Self-pay | Admitting: Physician Assistant

## 2021-04-25 DIAGNOSIS — L4050 Arthropathic psoriasis, unspecified: Secondary | ICD-10-CM

## 2021-04-25 NOTE — Telephone Encounter (Signed)
Next Visit: 05/06/2021   Last Visit: 12/03/2020   Last Fill: 03/10/2021 (30 day supply)   Dx: Psoriatic arthropathy   Current Dose per office note on 12/03/2020:Humira 40 mg sq injections every 14 days   Labs: 03/11/2021 CBC WNL.  Glucose is 191.  Rest of CMP WNL.   TB Gold: 11/07/2020 Neg    Okay to refill Humira?

## 2021-05-02 DIAGNOSIS — D849 Immunodeficiency, unspecified: Secondary | ICD-10-CM | POA: Diagnosis not present

## 2021-05-02 DIAGNOSIS — Z299 Encounter for prophylactic measures, unspecified: Secondary | ICD-10-CM | POA: Diagnosis not present

## 2021-05-02 DIAGNOSIS — E1165 Type 2 diabetes mellitus with hyperglycemia: Secondary | ICD-10-CM | POA: Diagnosis not present

## 2021-05-02 DIAGNOSIS — Z20828 Contact with and (suspected) exposure to other viral communicable diseases: Secondary | ICD-10-CM | POA: Diagnosis not present

## 2021-05-06 ENCOUNTER — Ambulatory Visit: Payer: Medicare PPO | Admitting: Rheumatology

## 2021-05-06 ENCOUNTER — Encounter: Payer: Self-pay | Admitting: Rheumatology

## 2021-05-06 ENCOUNTER — Other Ambulatory Visit: Payer: Self-pay

## 2021-05-06 VITALS — BP 141/88 | HR 60 | Ht 68.0 in | Wt 194.2 lb

## 2021-05-06 DIAGNOSIS — Z8639 Personal history of other endocrine, nutritional and metabolic disease: Secondary | ICD-10-CM | POA: Diagnosis not present

## 2021-05-06 DIAGNOSIS — J069 Acute upper respiratory infection, unspecified: Secondary | ICD-10-CM | POA: Diagnosis not present

## 2021-05-06 DIAGNOSIS — L409 Psoriasis, unspecified: Secondary | ICD-10-CM

## 2021-05-06 DIAGNOSIS — L4050 Arthropathic psoriasis, unspecified: Secondary | ICD-10-CM | POA: Diagnosis not present

## 2021-05-06 DIAGNOSIS — Z79899 Other long term (current) drug therapy: Secondary | ICD-10-CM

## 2021-05-06 DIAGNOSIS — Z96652 Presence of left artificial knee joint: Secondary | ICD-10-CM | POA: Diagnosis not present

## 2021-05-06 DIAGNOSIS — U071 COVID-19: Secondary | ICD-10-CM

## 2021-05-06 DIAGNOSIS — M545 Low back pain, unspecified: Secondary | ICD-10-CM

## 2021-05-06 DIAGNOSIS — M19041 Primary osteoarthritis, right hand: Secondary | ICD-10-CM | POA: Diagnosis not present

## 2021-05-06 DIAGNOSIS — Z8679 Personal history of other diseases of the circulatory system: Secondary | ICD-10-CM | POA: Diagnosis not present

## 2021-05-06 DIAGNOSIS — M19042 Primary osteoarthritis, left hand: Secondary | ICD-10-CM

## 2021-05-06 DIAGNOSIS — Z96651 Presence of right artificial knee joint: Secondary | ICD-10-CM | POA: Diagnosis not present

## 2021-05-06 NOTE — Patient Instructions (Addendum)
Standing Labs We placed an order today for your standing lab work.   Please have your standing labs drawn in January and every 3 months  If possible, please have your labs drawn 2 weeks prior to your appointment so that the provider can discuss your results at your appointment.  Please note that you may see your imaging and lab results in [facility] before we have reviewed them. We may be awaiting multiple results to interpret others before contacting you. Please allow our office up to 72 hours to thoroughly review all of the results before contacting the office for clarification of your results.  We have open lab daily: Monday through Thursday from 1:30-4:30 PM and Friday from 1:30-4:00 PM at the office of Dr. Bo Merino, [facility].   Please be advised, all patients with office appointments requiring lab work will take precedent over walk-in lab work.  If possible, please come for your lab work on Monday and Friday afternoons, as you may experience shorter wait times. The office is located at [address], [place], [place], [place] [address] No appointment is necessary.   Labs are drawn by [facility]. Please bring your co-pay at the time of your lab draw.  You may receive a bill from [facility] for your lab work.  If you wish to have your labs drawn at another location, please call the office 24 hours in advance to send orders.  If you have any questions regarding directions or hours of operation,  please call [phone].   As a reminder, please drink plenty of water prior to coming for your lab work. Thanks!   Vaccines You are taking a medication(s) that can suppress your immune system.  The following immunizations are recommended: Flu annually Covid-19  Td/Tdap (tetanus, diphtheria, pertussis) every 10 years Pneumonia (Prevnar 15 then Pneumovax 23 at least 1 year apart.  Alternatively, can take Prevnar 20 without needing additional dose) Shingrix: 2 doses from 4 weeks  to 6 months apart  Please check with your PCP to make sure you are up to date.   If you have signs or symptoms of an infection or start antibiotics: First, call your PCP for workup of your infection. Hold your medication through the infection, until you complete your antibiotics, and until symptoms resolve if you take the following: Injectable medication (Actemra, Benlysta, Cimzia, Cosentyx, Enbrel, Humira, Kevzara, Orencia, Remicade, Simponi, Stelara, Taltz, Tremfya) Methotrexate Leflunomide (Arava) Mycophenolate (Cellcept) Morrie Sheldon, Olumiant, or Rinvoq    Please get annual skin exam to screen for skin cancer while you are on Humira.

## 2021-05-19 ENCOUNTER — Other Ambulatory Visit: Payer: Self-pay | Admitting: Rheumatology

## 2021-05-20 NOTE — Telephone Encounter (Signed)
Next Visit: 10/06/2021  Last Visit: 05/06/2021  Last Fill: 07/17/2020  DX: Psoriatic arthropathy   Current Dose per office note 05/06/2021: MTX 0.8 ml sq once weekly  Labs: 03/11/2021 CBC WNL.  Glucose is 191.  Rest of CMP WNL.   Okay to refill MTX?

## 2021-06-12 ENCOUNTER — Other Ambulatory Visit: Payer: Self-pay | Admitting: *Deleted

## 2021-06-12 DIAGNOSIS — Z79899 Other long term (current) drug therapy: Secondary | ICD-10-CM

## 2021-06-12 DIAGNOSIS — L4050 Arthropathic psoriasis, unspecified: Secondary | ICD-10-CM

## 2021-06-13 DIAGNOSIS — Z79899 Other long term (current) drug therapy: Secondary | ICD-10-CM | POA: Diagnosis not present

## 2021-06-13 DIAGNOSIS — L4050 Arthropathic psoriasis, unspecified: Secondary | ICD-10-CM | POA: Diagnosis not present

## 2021-06-14 LAB — COMPLETE METABOLIC PANEL WITH GFR
AG Ratio: 1.4 (calc) (ref 1.0–2.5)
ALT: 36 U/L (ref 9–46)
AST: 24 U/L (ref 10–35)
Albumin: 4.1 g/dL (ref 3.6–5.1)
Alkaline phosphatase (APISO): 54 U/L (ref 35–144)
BUN: 17 mg/dL (ref 7–25)
CO2: 29 mmol/L (ref 20–32)
Calcium: 9.4 mg/dL (ref 8.6–10.3)
Chloride: 100 mmol/L (ref 98–110)
Creat: 0.95 mg/dL (ref 0.70–1.35)
Globulin: 2.9 g/dL (calc) (ref 1.9–3.7)
Glucose, Bld: 154 mg/dL — ABNORMAL HIGH (ref 65–139)
Potassium: 4 mmol/L (ref 3.5–5.3)
Sodium: 136 mmol/L (ref 135–146)
Total Bilirubin: 0.7 mg/dL (ref 0.2–1.2)
Total Protein: 7 g/dL (ref 6.1–8.1)
eGFR: 88 mL/min/{1.73_m2} (ref >=60)

## 2021-06-14 LAB — CBC WITH DIFFERENTIAL/PLATELET
Absolute Monocytes: 533 cells/uL (ref 200–950)
Basophils Absolute: 29 cells/uL (ref 0–200)
Basophils Relative: 0.4 %
Eosinophils Absolute: 108 cells/uL (ref 15–500)
Eosinophils Relative: 1.5 %
HCT: 45.2 % (ref 38.5–50.0)
Hemoglobin: 15.2 g/dL (ref 13.2–17.1)
Lymphs Abs: 3895 cells/uL (ref 850–3900)
MCH: 32.3 pg (ref 27.0–33.0)
MCHC: 33.6 g/dL (ref 32.0–36.0)
MCV: 96.2 fL (ref 80.0–100.0)
MPV: 10.4 fL (ref 7.5–12.5)
Monocytes Relative: 7.4 %
Neutro Abs: 2635 cells/uL (ref 1500–7800)
Neutrophils Relative %: 36.6 %
Platelets: 242 10*3/uL (ref 140–400)
RBC: 4.7 10*6/uL (ref 4.20–5.80)
RDW: 12.6 % (ref 11.0–15.0)
Total Lymphocyte: 54.1 %
WBC: 7.2 10*3/uL (ref 3.8–10.8)

## 2021-06-15 NOTE — Progress Notes (Signed)
CBC and CMP are normal except glucose is elevated.

## 2021-06-24 DIAGNOSIS — Z87891 Personal history of nicotine dependence: Secondary | ICD-10-CM | POA: Diagnosis not present

## 2021-06-24 DIAGNOSIS — I25119 Atherosclerotic heart disease of native coronary artery with unspecified angina pectoris: Secondary | ICD-10-CM | POA: Diagnosis not present

## 2021-06-24 DIAGNOSIS — Z299 Encounter for prophylactic measures, unspecified: Secondary | ICD-10-CM | POA: Diagnosis not present

## 2021-06-24 DIAGNOSIS — F339 Major depressive disorder, recurrent, unspecified: Secondary | ICD-10-CM | POA: Diagnosis not present

## 2021-06-24 DIAGNOSIS — E1165 Type 2 diabetes mellitus with hyperglycemia: Secondary | ICD-10-CM | POA: Diagnosis not present

## 2021-06-24 DIAGNOSIS — I10 Essential (primary) hypertension: Secondary | ICD-10-CM | POA: Diagnosis not present

## 2021-09-09 DIAGNOSIS — S134XXA Sprain of ligaments of cervical spine, initial encounter: Secondary | ICD-10-CM | POA: Diagnosis not present

## 2021-09-09 DIAGNOSIS — M9901 Segmental and somatic dysfunction of cervical region: Secondary | ICD-10-CM | POA: Diagnosis not present

## 2021-09-09 DIAGNOSIS — M9902 Segmental and somatic dysfunction of thoracic region: Secondary | ICD-10-CM | POA: Diagnosis not present

## 2021-09-09 DIAGNOSIS — S233XXA Sprain of ligaments of thoracic spine, initial encounter: Secondary | ICD-10-CM | POA: Diagnosis not present

## 2021-09-22 NOTE — Progress Notes (Signed)
? ?Office Visit Note ? ?Patient: Brandon Frazier             ?Date of Birth: 04/19/1956           ?MRN: [id number]             ?PCP: Ignatius Specking, MD ?Referring: Ignatius Specking, MD ?Visit Date: 10/06/2021 ?Occupation: @GUAROCC @ ? ?Subjective:  ?Medication monitoring  ? ?History of Present Illness: Brandon Frazier is a 66 y.o. male with history of psoriatic arthritis and osteoarthritis.  Patient is on Humira 40 mg sq injections every 14 days and methotrexate 0.8 ml sq injections once weekly.  He continues to tolerate these medications without any side effects.  He denies any signs or symptoms of a psoriatic arthritis flare.  He experiences intermittent pain and stiffness in both hands especially in both thumbs.  He denies any joint swelling at this time.  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  He was active on the beach with his grandson this weekend and is not currently using any signs or symptoms of a flare.  He has no SI joint discomfort at this time.  No Achilles tendinitis or plantar fasciitis.  No active psoriasis at this time.  He has not had any recent infections. ? ? ? ?Activities of Daily Living:  ?Patient reports morning stiffness for 10 minutes.   ?Patient Denies nocturnal pain.  ?Difficulty dressing/grooming: Denies ?Difficulty climbing stairs: Denies ?Difficulty getting out of chair: Denies ?Difficulty using hands for taps, buttons, cutlery, and/or writing: Denies ? ?Review of Systems  ?Constitutional:  Negative for fatigue.  ?HENT:  Negative for mouth dryness.   ?Eyes:  Negative for dryness.  ?Respiratory:  Negative for shortness of breath.   ?Cardiovascular:  Negative for swelling in legs/feet.  ?Gastrointestinal:  Negative for constipation.  ?Endocrine: Negative for increased urination.  ?Genitourinary:  Negative for difficulty urinating.  ?Musculoskeletal:  Positive for joint pain, joint pain, joint swelling and morning stiffness.  ?Skin:  Negative for rash.   ?Allergic/Immunologic: Negative for susceptible to infections.  ?Neurological:  Negative for numbness.  ?Hematological:  Negative for bruising/bleeding tendency.  ?Psychiatric/Behavioral:  Negative for sleep disturbance.   ? ?PMFS History:  ?Patient Active Problem List  ? Diagnosis Date Noted  ? Onychomycosis 08/28/2019  ? Psoriatic arthropathy (HCC) 06/05/2016  ? History of hypertension 06/05/2016  ? History of diabetes mellitus 06/05/2016  ? History of coronary artery disease 06/05/2016  ? History of hyperlipidemia 06/05/2016  ? High risk medication use 06/05/2016  ? Status post left partial knee replacement 06/05/2016  ? Primary localized osteoarthrosis, lower leg 02/16/2014  ? 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 Psoriasis 12/10/2010  ? Knee pain, bilateral 10/30/2010  ? Hip pain, bilateral 10/30/2010  ? Leg length discrepancy 10/30/2010  ?  ?Past Medical History:  ?Diagnosis Date  ? Anterior myocardial infarction [facility]) 1995  ? Anxiety   ? CAD (coronary artery disease)   ? Angioplasty of LAD 1995  ? Essential hypertension   ? GERD (gastroesophageal reflux disease)   ? Psoriatic arthritis (HCC)   ? Type 2 diabetes mellitus (HCC)   ?  ?Family History  ?Problem Relation Age of Onset  ? GER disease Mother   ? Heart disease Mother   ? Liver cancer Father   ? Arthritis Son   ? Psoriasis Son   ? Psoriasis Daughter   ? ?Past Surgical History:  ?Procedure Laterality Date  ? CARDIAC CATHETERIZATION    ?  1995, 2001  ? COLONOSCOPY    ? COLONOSCOPY  07/04/2020  ? EYE SURGERY Bilateral   ? lasik  ? INSERTION OF MESH N/A 04/23/2016  ? Procedure: INSERTION OF MESH;  Surgeon: Ovidio Kin, MD;  Location: [facility] OR;  Service: General;  Laterality: N/A;  ? KNEE ARTHROPLASTY    ? left knee arthroscopy    ? 1990, 1997  ? MEDIAL PARTIAL KNEE REPLACEMENT Right 05/2019  ? PARTIAL KNEE ARTHROPLASTY Left 02/16/2014  ? Procedure: LEFT KNEE UNI ARTHROPLASTY;  Surgeon: Eulas Post, MD;  Location: [facility];  Service: Orthopedics;   Laterality: Left;  ? UMBILICAL HERNIA REPAIR N/A 04/23/2016  ? Procedure: LAPAROSCOPIC UMBILICAL HERNIA;  Surgeon: Ovidio Kin, MD;  Location[facility] OR;  Service: General;  Laterality: N/A;  ? ?Social History  ? ?Social History Narrative  ? Not on file  ? ?Immunization History  ?Administered Date(s) Administered  ? Influenza,inj,Quad PF,6+ Mos 03/19/2017, 03/24/2018  ? Tdap 05/14/2018  ?  ? ?Objective: ?Vital Signs: BP 134/70 (BP Location: Left Arm, Patient Position: Sitting, Cuff Size: Normal)   Pulse 61   Resp 15   Ht 5\' 8"  (1.727 m)   Wt 198 lb (89.8 kg)   BMI 30.11 kg/m?   ? ?Physical Exam ?Vitals and nursing note reviewed.  ?Constitutional:   ?   Appearance: He is well-developed.  ?HENT:  ?   Head: Normocephalic and atraumatic.  ?Eyes:  ?   Conjunctiva/sclera: Conjunctivae normal.  ?   Pupils: Pupils are equal, round, and reactive to light.  ?Cardiovascular:  ?   Rate and Rhythm: Normal rate and regular rhythm.  ?   Heart sounds: Normal heart sounds.  ?Pulmonary:  ?   Effort: Pulmonary effort is normal.  ?   Breath sounds: Normal breath sounds.  ?Abdominal:  ?   General: Bowel sounds are normal.  ?   Palpations: Abdomen is soft.  ?Musculoskeletal:  ?   Cervical back: Normal range of motion and neck supple.  ?Skin: ?   General: Skin is warm and dry.  ?   Capillary Refill: Capillary refill takes less than 2 seconds.  ?Neurological:  ?   Mental Status: He is alert and oriented to person, place, and time.  ?Psychiatric:     ?   Behavior: Behavior normal.  ?  ? ?Musculoskeletal Exam: C-spine, thoracic spine, and lumbar spine good ROM.  No midline spinal tenderness.  Left trapezius muscle tension and tenderness.  No SI joint tenderness.  Shoulder joints, elbow joints, wrist joints, MCPs, PIPs, and DIPs good ROM with no synovitis.  Tenderness over both CMC joints.  Thickening and tenderness of the right 1st MCP.  Complete fist formation bilaterally.  Hip joints have good ROM.  Partial knee replacements have good  ROM with no warmth or effusion.  Ankle joints have good ROM with no tenderness or joint swelling.  ? ?CDAI Exam: ?CDAI Score: -- ?Patient Global: --; Provider Global: -- ?Swollen: --; Tender: -- ?Joint Exam 10/06/2021  ? ?No joint exam has been documented for this visit  ? ?There is currently no information documented on the homunculus. Go to the Rheumatology activity and complete the homunculus joint exam. ? ?Investigation: ?No additional findings. ? ?Imaging: ?No results found. ? ?Recent Labs: ?Lab Results  ?Component Value Date  ? WBC 7.2 06/13/2021  ? HGB 15.2 06/13/2021  ? PLT 242 06/13/2021  ? NA 136 06/13/2021  ? K 4.0 06/13/2021  ? CL 100 06/13/2021  ?  CO2 29 06/13/2021  ? GLUCOSE 154 (H) 06/13/2021  ? BUN 17 06/13/2021  ? CREATININE 0.95 06/13/2021  ? BILITOT 0.7 06/13/2021  ? AST 24 06/13/2021  ? ALT 36 06/13/2021  ? PROT 7.0 06/13/2021  ? CALCIUM 9.4 06/13/2021  ? GFRAA 104 11/07/2020  ? QFTBGOLDPLUS NEGATIVE 11/07/2020  ? ? ?Speciality Comments: No specialty comments available. ? ?Procedures:  ?No procedures performed ?Allergies: Aspirin and Codeine  ? ? ?Assessment / Plan:     ?Visit Diagnoses: Psoriatic arthropathy (HCC): He has no synovitis or dactylitis on examination today.  He has not had any signs or symptoms of a psoriatic arthritis flare.  He is clinically doing well on Humira 40 mg sq injections every 14 days and methotrexate 0.8 mL sq injections once weekly.  He has no SI joint tenderness or stiffness at this time.  No evidence of Achilles tendinitis or plantar fasciitis.  No active psoriasis was noted.  Intermittent pain and stiff likely due to underlying osteoarthritis.  Most of his discomfort is in both thumbs especially over bilateral CMC joints and the right first MCP joint.  Different treatment options were discussed today including the use of Voltaren gel, arthritis compression gloves, and CMC joint braces.  Discussed the importance of joint protection and muscle strengthening.  He will  remain on Humira and methotrexate as combination therapy.  Updated lab work was obtained today to monitor for drug toxicity.  He will follow up in 5 months or sooner if needed.  ? ?Psoriasis: He has no active psorias

## 2021-10-06 ENCOUNTER — Ambulatory Visit: Payer: Medicare PPO | Admitting: Physician Assistant

## 2021-10-06 ENCOUNTER — Encounter: Payer: Self-pay | Admitting: Physician Assistant

## 2021-10-06 VITALS — BP 134/70 | HR 61 | Resp 15 | Ht 68.0 in | Wt 198.0 lb

## 2021-10-06 DIAGNOSIS — M19041 Primary osteoarthritis, right hand: Secondary | ICD-10-CM

## 2021-10-06 DIAGNOSIS — M19042 Primary osteoarthritis, left hand: Secondary | ICD-10-CM

## 2021-10-06 DIAGNOSIS — L4050 Arthropathic psoriasis, unspecified: Secondary | ICD-10-CM

## 2021-10-06 DIAGNOSIS — Z8639 Personal history of other endocrine, nutritional and metabolic disease: Secondary | ICD-10-CM

## 2021-10-06 DIAGNOSIS — Z111 Encounter for screening for respiratory tuberculosis: Secondary | ICD-10-CM

## 2021-10-06 DIAGNOSIS — Z96651 Presence of right artificial knee joint: Secondary | ICD-10-CM

## 2021-10-06 DIAGNOSIS — L409 Psoriasis, unspecified: Secondary | ICD-10-CM | POA: Diagnosis not present

## 2021-10-06 DIAGNOSIS — Z79899 Other long term (current) drug therapy: Secondary | ICD-10-CM | POA: Diagnosis not present

## 2021-10-06 DIAGNOSIS — E559 Vitamin D deficiency, unspecified: Secondary | ICD-10-CM | POA: Diagnosis not present

## 2021-10-06 DIAGNOSIS — Z8679 Personal history of other diseases of the circulatory system: Secondary | ICD-10-CM | POA: Diagnosis not present

## 2021-10-06 DIAGNOSIS — Z96652 Presence of left artificial knee joint: Secondary | ICD-10-CM | POA: Diagnosis not present

## 2021-10-06 NOTE — Patient Instructions (Addendum)
Magnesium malate  ? ? ?Standing Labs ?We placed an order today for your standing lab work.  ? ?Please have your standing labs drawn in August and every 3 months  ? ?If possible, please have your labs drawn 2 weeks prior to your appointment so that the provider can discuss your results at your appointment. ? ?Please note that you may see your imaging and lab results in [place] before we have reviewed them. ?We may be awaiting multiple results to interpret others before contacting you. ?Please allow our office up to 72 hours to thoroughly review all of the results before contacting the office for clarification of your results. ? ?We have open lab daily: ?Monday through Thursday from 1:30-4:30 PM and Friday from 1:30-4:00 PM ?at the office of Dr. Bo Merino, [facility].   ?Please be advised, all patients with office appointments requiring lab work will take precedent over walk-in lab work.  ?If possible, please come for your lab work on Monday and Friday afternoons, as you may experience shorter wait times. ?The office is located at [address] [place], [place], [place] [address] ?No appointment is necessary.   ?Labs are drawn by [facility]. Please bring your co-pay at the time of your lab draw.  You may receive a bill from [facility] for your lab work. ? ?Please note if you are on Hydroxychloroquine and and an order has been placed for a Hydroxychloroquine level, you will need to have it drawn 4 hours or more after your last dose. ? ?If you wish to have your labs drawn at another location, please call the office 24 hours in advance to send orders. ? ?If you have any questions regarding directions or hours of operation,  ?please call [phone].   ?As a reminder, please drink plenty of water prior to coming for your lab work. Thanks! ? ?If you have signs or symptoms of an infection or start antibiotics: ?First, call your PCP for workup of your infection. ?Hold your medication through the infection,  until you complete your antibiotics, and until symptoms resolve if you take the following: ?Injectable medication (Actemra, Benlysta, Cimzia, Cosentyx, Enbrel, Humira, Kevzara, Orencia, Remicade, Simponi, [place], [place], [place]) ?Methotrexate ?Leflunomide Jolee Ewing) ?Mycophenolate (Cellcept) ?Roma Kayser, or Rinvoq ? ? ?Vaccines ?You are taking a medication(s) that can suppress your immune system.  The following immunizations are recommended: ?Flu annually ?Covid-19  ?Td/Tdap (tetanus, diphtheria, pertussis) every 10 years ?Pneumonia (Prevnar 15 then Pneumovax 23 at least 1 year apart.  Alternatively, can take Prevnar 20 without needing additional dose) ?Shingrix: 2 doses from 4 weeks to 6 months apart ? ?Please check with your PCP to make sure you are up to date. ? ? ?Hand Exercises ?Hand exercises can be helpful for almost anyone. These exercises can strengthen the hands, improve flexibility and movement, and increase blood flow to the hands. These results can make work and daily tasks easier. ?Hand exercises can be especially helpful for people who have joint pain from arthritis or have nerve damage from overuse (carpal tunnel syndrome). ?These exercises can also help people who have injured a hand. ?Exercises ?Most of these hand exercises are gentle stretching and motion exercises. It is usually safe to do them often throughout the day. Warming up your hands before exercise may help to reduce stiffness. You can do this with gentle massage or by placing your hands in warm water for 10-15 minutes. ?It is normal to feel some stretching, pulling, tightness, or mild discomfort as you begin  new exercises. This will gradually improve. Stop an exercise right away if you feel sudden, severe pain or your pain gets worse. Ask your health care provider which exercises are best for you. ?Knuckle bend or "claw" fist ? ?Stand or sit with your arm, hand, and all five fingers pointed straight up. Make sure to keep your wrist  straight during the exercise. ?Gently bend your fingers down toward your palm until the tips of your fingers are touching the top of your palm. Keep your big knuckle straight and just bend the small knuckles in your fingers. ?Hold this position for __________ seconds. ?Straighten (extend) your fingers back to the starting position. ?Repeat this exercise 5-10 times with each hand. ?Full finger fist ? ?Stand or sit with your arm, hand, and all five fingers pointed straight up. Make sure to keep your wrist straight during the exercise. ?Gently bend your fingers into your palm until the tips of your fingers are touching the middle of your palm. ?Hold this position for __________ seconds. ?Extend your fingers back to the starting position, stretching every joint fully. ?Repeat this exercise 5-10 times with each hand. ?Straight fist ?Stand or sit with your arm, hand, and all five fingers pointed straight up. Make sure to keep your wrist straight during the exercise. ?Gently bend your fingers at the big knuckle, where your fingers meet your hand, and the middle knuckle. Keep the knuckle at the tips of your fingers straight and try to touch the bottom of your palm. ?Hold this position for __________ seconds. ?Extend your fingers back to the starting position, stretching every joint fully. ?Repeat this exercise 5-10 times with each hand. ?Tabletop ? ?Stand or sit with your arm, hand, and all five fingers pointed straight up. Make sure to keep your wrist straight during the exercise. ?Gently bend your fingers at the big knuckle, where your fingers meet your hand, as far down as you can while keeping the small knuckles in your fingers straight. Think of forming a tabletop with your fingers. ?Hold this position for __________ seconds. ?Extend your fingers back to the starting position, stretching every joint fully. ?Repeat this exercise 5-10 times with each hand. ?Finger spread ? ?Place your hand flat on a table with your palm  facing down. Make sure your wrist stays straight as you do this exercise. ?Spread your fingers and thumb apart from each other as far as you can until you feel a gentle stretch. Hold this position for __________ seconds. ?Bring your fingers and thumb tight together again. Hold this position for __________ seconds. ?Repeat this exercise 5-10 times with each hand. ?Making circles ? ?Stand or sit with your arm, hand, and all five fingers pointed straight up. Make sure to keep your wrist straight during the exercise. ?Make a circle by touching the tip of your thumb to the tip of your index finger. ?Hold for __________ seconds. Then open your hand wide. ?Repeat this motion with your thumb and each finger on your hand. ?Repeat this exercise 5-10 times with each hand. ?Thumb motion ? ?Sit with your forearm resting on a table and your wrist straight. Your thumb should be facing up toward the ceiling. Keep your fingers relaxed as you move your thumb. ?Lift your thumb up as high as you can toward the ceiling. Hold for __________ seconds. ?Bend your thumb across your palm as far as you can, reaching the tip of your thumb for the small finger (pinkie) side of your palm. Hold for __________  seconds. ?Repeat this exercise 5-10 times with each hand. ?Grip strengthening ? ?Hold a stress ball or other soft ball in the middle of your hand. ?Slowly increase the pressure, squeezing the ball as much as you can without causing pain. Think of bringing the tips of your fingers into the middle of your palm. All of your finger joints should bend when doing this exercise. ?Hold your squeeze for __________ seconds, then relax. ?Repeat this exercise 5-10 times with each hand. ?Contact a health care provider if: ?Your hand pain or discomfort gets much worse when you do an exercise. ?Your hand pain or discomfort does not improve within 2 hours after you exercise. ?If you have any of these problems, stop doing these exercises right away. Do not do  them again unless your health care provider says that you can. ?Get help right away if: ?You develop sudden, severe hand pain or swelling. If this happens, stop doing these exercises right away. Do no

## 2021-10-07 DIAGNOSIS — D849 Immunodeficiency, unspecified: Secondary | ICD-10-CM | POA: Diagnosis not present

## 2021-10-07 DIAGNOSIS — Z789 Other specified health status: Secondary | ICD-10-CM | POA: Diagnosis not present

## 2021-10-07 DIAGNOSIS — F339 Major depressive disorder, recurrent, unspecified: Secondary | ICD-10-CM | POA: Diagnosis not present

## 2021-10-07 DIAGNOSIS — Z299 Encounter for prophylactic measures, unspecified: Secondary | ICD-10-CM | POA: Diagnosis not present

## 2021-10-07 DIAGNOSIS — I10 Essential (primary) hypertension: Secondary | ICD-10-CM | POA: Diagnosis not present

## 2021-10-07 DIAGNOSIS — E1165 Type 2 diabetes mellitus with hyperglycemia: Secondary | ICD-10-CM | POA: Diagnosis not present

## 2021-10-07 NOTE — Progress Notes (Signed)
CBC WNL. Glucose 123, rest of CMP WNL. Vitamin D WNL.

## 2021-10-09 LAB — COMPLETE METABOLIC PANEL WITH GFR
AG Ratio: 1.4 (calc) (ref 1.0–2.5)
ALT: 30 U/L (ref 9–46)
AST: 21 U/L (ref 10–35)
Albumin: 4 g/dL (ref 3.6–5.1)
Alkaline phosphatase (APISO): 53 U/L (ref 35–144)
BUN: 15 mg/dL (ref 7–25)
CO2: 28 mmol/L (ref 20–32)
Calcium: 9.4 mg/dL (ref 8.6–10.3)
Chloride: 102 mmol/L (ref 98–110)
Creat: 0.97 mg/dL (ref 0.70–1.35)
Globulin: 2.8 g/dL (calc) (ref 1.9–3.7)
Glucose, Bld: 123 mg/dL — ABNORMAL HIGH (ref 65–99)
Potassium: 4.2 mmol/L (ref 3.5–5.3)
Sodium: 139 mmol/L (ref 135–146)
Total Bilirubin: 1 mg/dL (ref 0.2–1.2)
Total Protein: 6.8 g/dL (ref 6.1–8.1)
eGFR: 86 mL/min/{1.73_m2} (ref >=60)

## 2021-10-09 LAB — CBC WITH DIFFERENTIAL/PLATELET
Absolute Monocytes: 627 cells/uL (ref 200–950)
Basophils Absolute: 39 cells/uL (ref 0–200)
Basophils Relative: 0.7 %
Eosinophils Absolute: 67 cells/uL (ref 15–500)
Eosinophils Relative: 1.2 %
HCT: 48.5 % (ref 38.5–50.0)
Hemoglobin: 16.2 g/dL (ref 13.2–17.1)
Lymphs Abs: 2822 cells/uL (ref 850–3900)
MCH: 31.6 pg (ref 27.0–33.0)
MCHC: 33.4 g/dL (ref 32.0–36.0)
MCV: 94.7 fL (ref 80.0–100.0)
MPV: 9.9 fL (ref 7.5–12.5)
Monocytes Relative: 11.2 %
Neutro Abs: 2044 cells/uL (ref 1500–7800)
Neutrophils Relative %: 36.5 %
Platelets: 260 10*3/uL (ref 140–400)
RBC: 5.12 10*6/uL (ref 4.20–5.80)
RDW: 13.1 % (ref 11.0–15.0)
Total Lymphocyte: 50.4 %
WBC: 5.6 10*3/uL (ref 3.8–10.8)

## 2021-10-09 LAB — VITAMIN D 25 HYDROXY (VIT D DEFICIENCY, FRACTURES): Vit D, 25-Hydroxy: 53 ng/mL (ref 30–100)

## 2021-10-09 LAB — QUANTIFERON-TB GOLD PLUS
Mitogen-NIL: >10 IU/mL
NIL: 0.07 IU/mL
QuantiFERON-TB Gold Plus: NEGATIVE
TB1-NIL: 0.01 IU/mL
TB2-NIL: 0.02 IU/mL

## 2021-10-10 NOTE — Progress Notes (Signed)
TB Gold negative

## 2021-10-23 ENCOUNTER — Other Ambulatory Visit: Payer: Self-pay | Admitting: Physician Assistant

## 2021-10-23 DIAGNOSIS — L4050 Arthropathic psoriasis, unspecified: Secondary | ICD-10-CM

## 2021-10-23 NOTE — Telephone Encounter (Signed)
Next Visit: 04/02/2022  Last Visit: 10/06/2021  Last Fill: 04/25/2021  QM:[id number] arthropathy   Current Dose per office note 10/06/2021: Humira 40 mg sq injections every 14 days  Labs: 10/06/2021 CBC WNL. Glucose 123, rest of CMP WNL  TB Gold: 10/06/2021 Neg    Okay to refill Humira?

## 2021-11-03 ENCOUNTER — Other Ambulatory Visit: Payer: Self-pay | Admitting: Physician Assistant

## 2021-11-03 NOTE — Telephone Encounter (Signed)
Next Visit: 04/02/2022  Last Visit: 10/06/2021  Last Fill: 05/20/2021   DX:  Psoriatic arthropathy   Current Dose per office note 10/06/2021: MTX 0.8 ml sq once weekly  Labs: 10/06/2021 CBC WNL. Glucose 123, rest of CMP WNL  Okay to refill MTX?

## 2021-12-27 DIAGNOSIS — H04123 Dry eye syndrome of bilateral lacrimal glands: Secondary | ICD-10-CM | POA: Diagnosis not present

## 2021-12-27 DIAGNOSIS — H40033 Anatomical narrow angle, bilateral: Secondary | ICD-10-CM | POA: Diagnosis not present

## 2022-01-05 ENCOUNTER — Other Ambulatory Visit: Payer: Self-pay | Admitting: *Deleted

## 2022-01-05 DIAGNOSIS — Z79899 Other long term (current) drug therapy: Secondary | ICD-10-CM | POA: Diagnosis not present

## 2022-01-05 DIAGNOSIS — M25562 Pain in left knee: Secondary | ICD-10-CM | POA: Diagnosis not present

## 2022-01-06 LAB — COMPLETE METABOLIC PANEL WITH GFR
AG Ratio: 1.6 (calc) (ref 1.0–2.5)
ALT: 32 U/L (ref 9–46)
AST: 22 U/L (ref 10–35)
Albumin: 4.3 g/dL (ref 3.6–5.1)
Alkaline phosphatase (APISO): 52 U/L (ref 35–144)
BUN: 19 mg/dL (ref 7–25)
CO2: 26 mmol/L (ref 20–32)
Calcium: 9.3 mg/dL (ref 8.6–10.3)
Chloride: 101 mmol/L (ref 98–110)
Creat: 0.89 mg/dL (ref 0.70–1.35)
Globulin: 2.7 g/dL (calc) (ref 1.9–3.7)
Glucose, Bld: 122 mg/dL — ABNORMAL HIGH (ref 65–99)
Potassium: 4.4 mmol/L (ref 3.5–5.3)
Sodium: 137 mmol/L (ref 135–146)
Total Bilirubin: 1 mg/dL (ref 0.2–1.2)
Total Protein: 7 g/dL (ref 6.1–8.1)
eGFR: 93 mL/min/{1.73_m2} (ref >=60)

## 2022-01-06 LAB — CBC WITH DIFFERENTIAL/PLATELET
Absolute Monocytes: 687 cells/uL (ref 200–950)
Basophils Absolute: 38 cells/uL (ref 0–200)
Basophils Relative: 0.6 %
Eosinophils Absolute: 69 cells/uL (ref 15–500)
Eosinophils Relative: 1.1 %
HCT: 47.9 % (ref 38.5–50.0)
Hemoglobin: 16.1 g/dL (ref 13.2–17.1)
Lymphs Abs: 2766 cells/uL (ref 850–3900)
MCH: 29.9 pg (ref 27.0–33.0)
MCHC: 33.6 g/dL (ref 32.0–36.0)
MCV: 88.9 fL (ref 80.0–100.0)
MPV: 9.5 fL (ref 7.5–12.5)
Monocytes Relative: 10.9 %
Neutro Abs: 2741 cells/uL (ref 1500–7800)
Neutrophils Relative %: 43.5 %
Platelets: 256 10*3/uL (ref 140–400)
RBC: 5.39 10*6/uL (ref 4.20–5.80)
RDW: 15.3 % — ABNORMAL HIGH (ref 11.0–15.0)
Total Lymphocyte: 43.9 %
WBC: 6.3 10*3/uL (ref 3.8–10.8)

## 2022-01-06 NOTE — Progress Notes (Signed)
CBC and CMP are normal.  Glucose is mildly elevated, probably not a fasting sample.

## 2022-01-08 ENCOUNTER — Other Ambulatory Visit: Payer: Self-pay | Admitting: Rheumatology

## 2022-01-08 NOTE — Telephone Encounter (Signed)
Next Visit: 04/02/2022   Last Visit: 10/06/2021   Last Fill: 12/03/2021  DX:  Psoriatic arthropathy    Current Dose per office note 2/44/9753: folic acid 2 mg daily  Okay to refill Folic Acid?

## 2022-01-14 DIAGNOSIS — R7989 Other specified abnormal findings of blood chemistry: Secondary | ICD-10-CM | POA: Diagnosis not present

## 2022-01-14 DIAGNOSIS — E1165 Type 2 diabetes mellitus with hyperglycemia: Secondary | ICD-10-CM | POA: Diagnosis not present

## 2022-01-14 DIAGNOSIS — I10 Essential (primary) hypertension: Secondary | ICD-10-CM | POA: Diagnosis not present

## 2022-01-14 DIAGNOSIS — Z299 Encounter for prophylactic measures, unspecified: Secondary | ICD-10-CM | POA: Diagnosis not present

## 2022-01-14 DIAGNOSIS — Z789 Other specified health status: Secondary | ICD-10-CM | POA: Diagnosis not present

## 2022-01-14 DIAGNOSIS — I25119 Atherosclerotic heart disease of native coronary artery with unspecified angina pectoris: Secondary | ICD-10-CM | POA: Diagnosis not present

## 2022-03-20 NOTE — Progress Notes (Signed)
Office Visit Note  Patient: Brandon Frazier             Date of Birth: 1956/05/19           MRN: [id number]             PCP: Ignatius Specking, MD Referring: Ignatius Specking, MD Visit Date: 04/02/2022 Occupation: @GUAROCC @  Subjective:  Medication management  History of Present Illness: Brandon Frazier is a 66 y.o. male with history of psoriatic arthritis, psoriasis and osteoarthritis.  He states he has been doing well on Humira 40 mg subcu every other week and methotrexate 0.8 mL subcu weekly.  He has not had a psoriatic arthritis flare in a long time.  He denies any history of Achilles tendinitis, Planter fasciitis or uveitis.  He continues to have some discomfort in his bilateral CMC joints.  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  He lifts heavy objects.  He denies any episodes of psoriasis flare.  Activities of Daily Living:  Patient reports morning stiffness for 5-10 minutes.   Patient Denies nocturnal pain.  Difficulty dressing/grooming: Denies Difficulty climbing stairs: Denies Difficulty getting out of chair: Denies Difficulty using hands for taps, buttons, cutlery, and/or writing: Denies  Review of Systems  Constitutional:  Negative for fatigue.  HENT:  Negative for mouth sores and mouth dryness.   Eyes:  Negative for dryness.  Respiratory:  Negative for shortness of breath.   Cardiovascular:  Negative for chest pain and palpitations.  Gastrointestinal:  Negative for blood in stool, constipation and diarrhea.  Endocrine: Negative for increased urination.  Genitourinary:  Negative for involuntary urination.  Musculoskeletal:  Positive for joint pain, joint pain, joint swelling and morning stiffness. Negative for gait problem, myalgias, muscle weakness, muscle tenderness and myalgias.  Skin:  Negative for color change, rash and sensitivity to sunlight.  Allergic/Immunologic: Negative for susceptible to  infections.  Neurological:  Negative for dizziness and headaches.  Hematological:  Negative for swollen glands.  Psychiatric/Behavioral:  Negative for depressed mood and sleep disturbance. The patient is not nervous/anxious.     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   Psoriasis 12/10/2010   Knee pain, bilateral 10/30/2010   Hip pain, bilateral 10/30/2010   Leg length discrepancy 10/30/2010    Past Medical History:  Diagnosis Date   Anterior myocardial infarction (HCC) 1995   Anxiety    CAD (coronary artery disease)    Angioplasty of LAD 1995   Essential hypertension    GERD (gastroesophageal reflux disease)    Psoriatic arthritis (HCC)    Type 2 diabetes mellitus (HCC)     Family History  Problem Relation Age of Onset   GER disease Mother    Heart disease Mother    Liver cancer Father    Arthritis Son    Psoriasis Son    Psoriasis Daughter    Past Surgical History:  Procedure Laterality Date   CARDIAC CATHETERIZATION     1995, 2001   COLONOSCOPY  COLONOSCOPY  07/04/2020   EYE SURGERY Bilateral    lasik   INSERTION OF MESH N/A 04/23/2016   Procedure: INSERTION OF MESH;  Surgeon: Ovidio Kin, MD;  [facility];  Service: General;  Laterality: N/A;   KNEE ARTHROPLASTY     left knee arthroscopy     1990, 1997   MEDIAL PARTIAL KNEE REPLACEMENT Right 05/2019   PARTIAL KNEE ARTHROPLASTY Left 02/16/2014   Procedure: LEFT KNEE UNI ARTHROPLASTY;  Surgeon: Eulas Post, MD;  Location:  SURGERY CENTER;  Service:  Orthopedics;  Laterality: Left;   UMBILICAL HERNIA REPAIR N/A 04/23/2016   Procedure: LAPAROSCOPIC UMBILICAL HERNIA;  Surgeon: Ovidio Kin, MD;  Location: [facility] OR;  Service: General;  Laterality: N/A;   Social History   Social History Narrative   Not on file   Immunization History  Administered Date(s) Administered   Influenza,inj,Quad PF,6+ Mos 03/19/2017, 03/24/2018   Influenza-Unspecified 03/18/2022   Rsv, Bivalent, Protein Subunit Rsvpref,pf Verdis Frederickson) 03/18/2022   Tdap 05/14/2018   Zoster Recombinat (Shingrix) 10/27/2021     Objective: Vital Signs: BP 135/84 (BP Location: Left Arm, Patient Position: Sitting, Cuff Size: Normal)   Pulse 64   Resp 17   Ht 5\' 8"  (1.727 m)   Wt 193 lb (87.5 kg)   BMI 29.35 kg/m    Physical Exam Vitals and nursing note reviewed.  Constitutional:      Appearance: He is well-developed.  HENT:     Head: Normocephalic and atraumatic.  Eyes:     Conjunctiva/sclera: Conjunctivae normal.     Pupils: Pupils are equal, round, and reactive to light.  Cardiovascular:     Rate and Rhythm: Normal rate and regular rhythm.     Heart sounds: Normal heart sounds.  Pulmonary:     Effort: Pulmonary effort is normal.     Breath sounds: Normal breath sounds.  Abdominal:     General: Bowel sounds are normal.     Palpations: Abdomen is soft.  Musculoskeletal:     Cervical back: Normal range of motion and neck supple.  Skin:    General: Skin is warm and dry.     Capillary Refill: Capillary refill takes less than 2 seconds.  Neurological:     Mental Status: He is alert and oriented to person, place, and time.  Psychiatric:        Behavior: Behavior normal.      Musculoskeletal Exam: Cervical, thoracic and lumbar spine were in good range of motion.  He had no SI joint tenderness.  Shoulder joints, elbow joints, wrist joints with good range of motion.  He had bilateral CMC PIP and DIP thickening consistent with osteoarthritis.  Hip joints and knee joints  with good range of motion.  He had no tenderness over ankles or MTPs.  CDAI Exam: CDAI Score: -- Patient Global: --; Provider Global: -- Swollen: --; Tender: -- Joint Exam 04/02/2022   No joint exam has been documented for this visit   There is currently no information documented on the homunculus. Go to the Rheumatology activity and complete the homunculus joint exam.  Investigation: No additional findings.  Imaging: No results found.  Recent Labs: Lab Results  Component Value Date   WBC 6.3 01/05/2022   HGB 16.1 01/05/2022   PLT 256 01/05/2022   NA 137 01/05/2022   K 4.4 01/05/2022   CL 101 01/05/2022   CO2 26 01/05/2022   GLUCOSE 122 (H) 01/05/2022   BUN 19 01/05/2022   CREATININE 0.89 01/05/2022  BILITOT 1.0 01/05/2022   AST 22 01/05/2022   ALT 32 01/05/2022   PROT 7.0 01/05/2022   CALCIUM 9.3 01/05/2022   GFRAA 104 11/07/2020   QFTBGOLDPLUS NEGATIVE 10/06/2021    Speciality Comments: No specialty comments available.  Procedures:  No procedures performed Allergies: Aspirin and Codeine   Assessment / Plan:     Visit Diagnoses: Psoriatic arthropathy (HCC)-he has been doing well on combination of methotrexate and Humira.  He has not had a flare in a long time.  He denies any history of inflammatory arthritis, dactylitis, Planter fasciitis or Achilles tendinitis.  We had a detailed discussion regarding spacing Humira to every 21 days as a trial.  If he has a flare on the spacing the dose then we can go back to Humira 40 mg subcu every 14 days.  He was in agreement.  Psoriasis-no active psoriasis lesions were noted.  High risk medication use - Humira 40 mg sq injections every 14 days, MTX 0.8 ml sq once weekly, and folic acid 2 mg daily.  Labs obtained on January 05, 2022 CBC with differential and CMP with GFR were reviewed which were within normal limits.  TB gold was negative on Oct 06, 2021.  Information about immunization was placed in the AVS.  He was advised to  hold Humira and methotrexate if he develops an infection and resume after the infection resolves.  Annual skin examination to screen for skin cancer was also advised.  Primary osteoarthritis of both hands-he has osteoarthritis in his bilateral hands with CMC PIP and DIP thickening.  He is very active and lifts heavy objects.  I advised him to use CMC braces.  Status post left partial knee replacement-he had good range of motion without discomfort  Status post right partial knee replacement-he had good range of motion without any warmth swelling or effusion.  History of hyperlipidemia-cessation of heart disease with psoriatic arthritis was discussed.  Dietary modifications and exercises were discussed.  History of hypertension-blood pressure was normal today.  History of coronary artery disease-regular exercise was emphasized.  History of diabetes mellitus  Vitamin D deficiency-vitamin D was normal at 44 on Oct 06, 2021.  Orders: Orders Placed This Encounter  Procedures   CBC with Differential/Platelet   COMPLETE METABOLIC PANEL WITH GFR   No orders of the defined types were placed in this encounter.  .  Follow-Up Instructions: Return in about 5 months (around 09/01/2022) for Psoriatic arthritis.   Pollyann Savoy, MD  Note - This record has been created using Animal nutritionist.  Chart creation errors have been sought, but may not always  have been located. Such creation errors do not reflect on  the standard of medical care.

## 2022-04-02 ENCOUNTER — Encounter: Payer: Self-pay | Admitting: Rheumatology

## 2022-04-02 ENCOUNTER — Ambulatory Visit: Payer: Medicare PPO | Attending: Rheumatology | Admitting: Rheumatology

## 2022-04-02 VITALS — BP 135/84 | HR 64 | Resp 17 | Ht 68.0 in | Wt 193.0 lb

## 2022-04-02 DIAGNOSIS — L4050 Arthropathic psoriasis, unspecified: Secondary | ICD-10-CM

## 2022-04-02 DIAGNOSIS — M19041 Primary osteoarthritis, right hand: Secondary | ICD-10-CM | POA: Diagnosis not present

## 2022-04-02 DIAGNOSIS — Z8679 Personal history of other diseases of the circulatory system: Secondary | ICD-10-CM | POA: Diagnosis not present

## 2022-04-02 DIAGNOSIS — Z79899 Other long term (current) drug therapy: Secondary | ICD-10-CM | POA: Diagnosis not present

## 2022-04-02 DIAGNOSIS — Z96652 Presence of left artificial knee joint: Secondary | ICD-10-CM | POA: Diagnosis not present

## 2022-04-02 DIAGNOSIS — Z96651 Presence of right artificial knee joint: Secondary | ICD-10-CM

## 2022-04-02 DIAGNOSIS — L409 Psoriasis, unspecified: Secondary | ICD-10-CM

## 2022-04-02 DIAGNOSIS — E559 Vitamin D deficiency, unspecified: Secondary | ICD-10-CM

## 2022-04-02 DIAGNOSIS — Z8639 Personal history of other endocrine, nutritional and metabolic disease: Secondary | ICD-10-CM

## 2022-04-02 DIAGNOSIS — M19042 Primary osteoarthritis, left hand: Secondary | ICD-10-CM

## 2022-04-02 LAB — CBC WITH DIFFERENTIAL/PLATELET
Absolute Monocytes: 516 cells/uL (ref 200–950)
Basophils Absolute: 54 cells/uL (ref 0–200)
Basophils Relative: 0.8 %
Eosinophils Absolute: 121 cells/uL (ref 15–500)
Eosinophils Relative: 1.8 %
HCT: 48.9 % (ref 38.5–50.0)
Hemoglobin: 16.6 g/dL (ref 13.2–17.1)
Lymphs Abs: 3551 cells/uL (ref 850–3900)
MCH: 29.7 pg (ref 27.0–33.0)
MCHC: 33.9 g/dL (ref 32.0–36.0)
MCV: 87.5 fL (ref 80.0–100.0)
MPV: 10.3 fL (ref 7.5–12.5)
Monocytes Relative: 7.7 %
Neutro Abs: 2459 cells/uL (ref 1500–7800)
Neutrophils Relative %: 36.7 %
Platelets: 274 10*3/uL (ref 140–400)
RBC: 5.59 10*6/uL (ref 4.20–5.80)
RDW: 14.2 % (ref 11.0–15.0)
Total Lymphocyte: 53 %
WBC: 6.7 10*3/uL (ref 3.8–10.8)

## 2022-04-02 LAB — COMPLETE METABOLIC PANEL WITH GFR
AG Ratio: 1.5 (calc) (ref 1.0–2.5)
ALT: 39 U/L (ref 9–46)
AST: 25 U/L (ref 10–35)
Albumin: 4.4 g/dL (ref 3.6–5.1)
Alkaline phosphatase (APISO): 55 U/L (ref 35–144)
BUN: 18 mg/dL (ref 7–25)
CO2: 27 mmol/L (ref 20–32)
Calcium: 9.7 mg/dL (ref 8.6–10.3)
Chloride: 98 mmol/L (ref 98–110)
Creat: 0.82 mg/dL (ref 0.70–1.35)
Globulin: 3 g/dL (calc) (ref 1.9–3.7)
Glucose, Bld: 131 mg/dL — ABNORMAL HIGH (ref 65–99)
Potassium: 4.4 mmol/L (ref 3.5–5.3)
Sodium: 135 mmol/L (ref 135–146)
Total Bilirubin: 0.9 mg/dL (ref 0.2–1.2)
Total Protein: 7.4 g/dL (ref 6.1–8.1)
eGFR: 96 mL/min/{1.73_m2} (ref >=60)

## 2022-04-02 NOTE — Patient Instructions (Signed)
Standing Labs We placed an order today for your standing lab work.   Please have your standing labs drawn in February and every 14month  Please have your labs drawn 2 weeks prior to your appointment so that the provider can discuss your lab results at your appointment.  Please note that you may see your imaging and lab results in [place] we have reviewed them. We will contact you once all results are reviewed. Please allow our office up to 72 hours to thoroughly review all of the results before contacting the office for clarification of your results.  Lab hours are:   Monday through Thursday from 8:00 am -12:30 pm and 1:00 pm-5:00 pm and Friday from 8:00 am-12:00 pm.  Please be advised, all patients with office appointments requiring lab work will take precedent over walk-in lab work.   Labs are drawn by [facility]. Please bring your co-pay at the time of your lab draw.  You may receive a bill from [facility] your [place] work.  Please note if you are on Hydroxychloroquine and and an order has been placed for a Hydroxychloroquine level, you will need to have it drawn 4 hours or more after your last dose.  If you wish to have your labs drawn at another location, please call the office 24 hours in advance so we can fax the orders.  The office is [address] [place] [address] [place] [id number] appointment is necessary.    If you have any questions regarding directions or hours of operation,  please call [phone]   As a reminder, please drink plenty of water prior to coming for your lab work. Thanks!   Vaccines You are taking a medication(s) that can suppress your immune system.  The following immunizations are recommended: Flu annually Covid-19  Td/Tdap (tetanus, diphtheria, pertussis) every 10 years Pneumonia (Prevnar 15 then Pneumovax 23 at least 1 year apart.  Alternatively, can take Prevnar 20 without needing additional dose) Shingrix: 2 doses from 4 weeks  to 6 months apart  Please check with your PCP to make sure you are up to date.   If you have signs or symptoms of an infection or start antibiotics: First, call your PCP for workup of your infection. Hold your medication through the infection, until you complete your antibiotics, and until symptoms resolve if you take the following: Injectable medication (Actemra, Benlysta, Cimzia, Cosentyx, Enbrel, Humira, Kevzara, Orencia, Remicade, Simponi, Stelara, Taltz, Tremfya) Methotrexate Leflunomide (Arava) Mycophenolate (Cellcept) XMorrie Sheldon Olumiant, or Rinvoq   Get a skin examination to screen for skin cancer once a year while you are on Humira

## 2022-04-03 NOTE — Progress Notes (Signed)
Seen and CMP are normal.  Glucose is elevated.  It was not a fasting sample.  Please notify patient and forward results to his PCP.

## 2022-04-19 ENCOUNTER — Other Ambulatory Visit: Payer: Self-pay | Admitting: Physician Assistant

## 2022-04-19 DIAGNOSIS — L4050 Arthropathic psoriasis, unspecified: Secondary | ICD-10-CM

## 2022-04-20 NOTE — Telephone Encounter (Signed)
Next Visit: 09/04/2022  Last Visit: 04/02/2022  Last Fill: 10/23/2021  DX: Psoriatic arthropathy   Current Dose per office note 04/02/2022: Humira 40 mg sq injections every 14 days  Labs: 04/02/2022 CBC and CMP are normal.  Glucose is elevated.   TB Gold: 10/06/2021 Neg    Okay to refill Humira?

## 2022-04-30 DIAGNOSIS — R5383 Other fatigue: Secondary | ICD-10-CM | POA: Diagnosis not present

## 2022-04-30 DIAGNOSIS — Z789 Other specified health status: Secondary | ICD-10-CM | POA: Diagnosis not present

## 2022-04-30 DIAGNOSIS — Z0000 Encounter for general adult medical examination without abnormal findings: Secondary | ICD-10-CM | POA: Diagnosis not present

## 2022-04-30 DIAGNOSIS — I10 Essential (primary) hypertension: Secondary | ICD-10-CM | POA: Diagnosis not present

## 2022-04-30 DIAGNOSIS — Z1331 Encounter for screening for depression: Secondary | ICD-10-CM | POA: Diagnosis not present

## 2022-04-30 DIAGNOSIS — Z7189 Other specified counseling: Secondary | ICD-10-CM | POA: Diagnosis not present

## 2022-04-30 DIAGNOSIS — F339 Major depressive disorder, recurrent, unspecified: Secondary | ICD-10-CM | POA: Diagnosis not present

## 2022-04-30 DIAGNOSIS — Z1339 Encounter for screening examination for other mental health and behavioral disorders: Secondary | ICD-10-CM | POA: Diagnosis not present

## 2022-04-30 DIAGNOSIS — Z299 Encounter for prophylactic measures, unspecified: Secondary | ICD-10-CM | POA: Diagnosis not present

## 2022-05-01 DIAGNOSIS — R5383 Other fatigue: Secondary | ICD-10-CM | POA: Diagnosis not present

## 2022-05-01 DIAGNOSIS — E7800 Pure hypercholesterolemia, unspecified: Secondary | ICD-10-CM | POA: Diagnosis not present

## 2022-05-01 DIAGNOSIS — E039 Hypothyroidism, unspecified: Secondary | ICD-10-CM | POA: Diagnosis not present

## 2022-05-01 DIAGNOSIS — Z125 Encounter for screening for malignant neoplasm of prostate: Secondary | ICD-10-CM | POA: Diagnosis not present

## 2022-05-01 DIAGNOSIS — Z79899 Other long term (current) drug therapy: Secondary | ICD-10-CM | POA: Diagnosis not present

## 2022-05-01 DIAGNOSIS — E1165 Type 2 diabetes mellitus with hyperglycemia: Secondary | ICD-10-CM | POA: Diagnosis not present

## 2022-05-06 ENCOUNTER — Telehealth: Payer: Self-pay | Admitting: *Deleted

## 2022-05-06 NOTE — Telephone Encounter (Signed)
Labs received Joesph Fillers, FNP  Drawn on:04/28/2022  Reviewed by:Hazel Sams, PA-C  Labs drawn:CBC, PSA, Lipid, CMP, Hgb A1C  Results:Lypmhs Absolute 3.4  Glucose 135  BUN/Creat. Ratio 25  ALT 64  Patient is on Humira 40 mg SQ every 14 days and MTX 0.8 SQ weekly.

## 2022-05-13 ENCOUNTER — Other Ambulatory Visit: Payer: Self-pay | Admitting: *Deleted

## 2022-05-13 DIAGNOSIS — Z79899 Other long term (current) drug therapy: Secondary | ICD-10-CM

## 2022-05-13 NOTE — Telephone Encounter (Signed)
Patient states he is unable to get the injectable vial of MTX at the pharmacy. Patient is requesting to switch to MTX tablets.  Next Visit: 09/04/2022  Last Visit: 04/02/2022  Last Fill: 11/03/2021  DX: Psoriatic arthropathy   Current Dose per office note 04/02/2022: MTX 0.8 ml sq once weekly   Labs: 05/01/2022 Lypmhs Absolute 3.4  Glucose 135  BUN/Creat. Ratio 25  ALT 64  Okay to refill MTX tablets?

## 2022-05-13 NOTE — Telephone Encounter (Signed)
Patient advised and will have repeat labs completed today.

## 2022-05-14 ENCOUNTER — Other Ambulatory Visit: Payer: Self-pay | Admitting: *Deleted

## 2022-05-14 LAB — ALT: ALT: 44 U/L (ref 9–46)

## 2022-05-14 LAB — AST: AST: 25 U/L (ref 10–35)

## 2022-05-14 MED ORDER — METHOTREXATE SODIUM 2.5 MG PO TABS: 20.0000 mg | ORAL_TABLET | ORAL | 0 refills | Status: DC

## 2022-05-14 NOTE — Progress Notes (Signed)
AST and ALT normal.

## 2022-05-14 NOTE — Progress Notes (Signed)
It is okay to refill methotrexate.

## 2022-05-14 NOTE — Telephone Encounter (Signed)
-----   Message from Bo Merino, MD sent at 05/14/2022  8:10 AM EST ----- It is okay to refill methotrexate.

## 2022-07-06 ENCOUNTER — Other Ambulatory Visit: Payer: Self-pay | Admitting: Rheumatology

## 2022-07-16 DIAGNOSIS — Z299 Encounter for prophylactic measures, unspecified: Secondary | ICD-10-CM | POA: Diagnosis not present

## 2022-07-16 DIAGNOSIS — B001 Herpesviral vesicular dermatitis: Secondary | ICD-10-CM | POA: Diagnosis not present

## 2022-07-16 DIAGNOSIS — I10 Essential (primary) hypertension: Secondary | ICD-10-CM | POA: Diagnosis not present

## 2022-08-04 DIAGNOSIS — Z299 Encounter for prophylactic measures, unspecified: Secondary | ICD-10-CM | POA: Diagnosis not present

## 2022-08-04 DIAGNOSIS — I10 Essential (primary) hypertension: Secondary | ICD-10-CM | POA: Diagnosis not present

## 2022-08-04 DIAGNOSIS — I152 Hypertension secondary to endocrine disorders: Secondary | ICD-10-CM | POA: Diagnosis not present

## 2022-08-04 DIAGNOSIS — E1159 Type 2 diabetes mellitus with other circulatory complications: Secondary | ICD-10-CM | POA: Diagnosis not present

## 2022-08-04 DIAGNOSIS — B009 Herpesviral infection, unspecified: Secondary | ICD-10-CM | POA: Diagnosis not present

## 2022-08-06 ENCOUNTER — Telehealth: Payer: Self-pay | Admitting: *Deleted

## 2022-08-06 DIAGNOSIS — I10 Essential (primary) hypertension: Secondary | ICD-10-CM | POA: Diagnosis not present

## 2022-08-06 DIAGNOSIS — Z299 Encounter for prophylactic measures, unspecified: Secondary | ICD-10-CM | POA: Diagnosis not present

## 2022-08-06 DIAGNOSIS — B009 Herpesviral infection, unspecified: Secondary | ICD-10-CM | POA: Diagnosis not present

## 2022-08-06 NOTE — Telephone Encounter (Signed)
I would advise to hold Humira and methotrexate until the rash from shingles dries up.  Once the vesicles have dried up then he may resume both medications.

## 2022-08-06 NOTE — Telephone Encounter (Signed)
Patient contacted the office and states he has had a break out of cold sores x 2. Patient states he has never had cold sores before. Patient states he had the first break out about 3 weeks ago and he went to see his PCP. Patient was given a prescription for Valtrex. Patient states it cleared up for the most part. Patient states then last Friday he started to have another break out. He saw his PCP again who had him continue taking Valtrex and also prescribed Acyclovir cream. Patient is on Humira every 3 weeks and MTX weekly. Patient states his last Humira was this past weekend. Patient states he has not held his medication while on the MTX. Please advise.

## 2022-08-06 NOTE — Telephone Encounter (Signed)
Patient advised Dr. Estanislado Pandy would advise to hold Humira and methotrexate until the rash from cold sores dry up. Once the vesicles have dried up then he may resume both medications. Patient expressed understanding.

## 2022-08-20 DIAGNOSIS — J029 Acute pharyngitis, unspecified: Secondary | ICD-10-CM | POA: Diagnosis not present

## 2022-08-20 DIAGNOSIS — Z299 Encounter for prophylactic measures, unspecified: Secondary | ICD-10-CM | POA: Diagnosis not present

## 2022-08-20 DIAGNOSIS — J320 Chronic maxillary sinusitis: Secondary | ICD-10-CM | POA: Diagnosis not present

## 2022-08-21 NOTE — Progress Notes (Signed)
Office Visit Note  Patient: Brandon Frazier             Date of Birth: [birth year]           MRN: [id number]             PCP: Ignatius Specking, MD Referring: Ignatius Specking, MD Visit Date: 09/04/2022 Occupation: @GUAROCC @  Subjective:  Medication monitoring  History of Present Illness: JIHAAD HAGG is a 67 y.o. male with history of psoriatic arthritis and osteoarthritis.  Patient prescribed Humira 40 mg sq injections every 21 days, MTX 0.8 ml sq once weekly, and folic acid 2 mg daily.  He has been holding both Humira and methotrexate for the past 1 month due to recently being on Valtrex x 2 for cold sores followed by an antibiotic for a sinus infection. Patient continues to have aching and stiffness in bilateral first MCP joints but denies any joint swelling.  He denies any Achilles tendinitis or plantar fasciitis.  He denies any SI joint discomfort.  He states that overall his sort of arthritis remains well-controlled despite the gap in therapy.  He has not noticed any new or worsening symptoms as he was spacing the dosing of Humira to every 21 days.  He denies any psoriasis at this time.    Activities of Daily Living:  Patient reports morning stiffness for a few minutes.   Patient Denies nocturnal pain.  Difficulty dressing/grooming: Denies Difficulty climbing stairs: Denies Difficulty getting out of chair: Denies Difficulty using hands for taps, buttons, cutlery, and/or writing: Denies  Review of Systems  Constitutional:  Negative for fatigue.  HENT:  Negative for mouth sores and mouth dryness.   Eyes:  Negative for dryness.  Respiratory:  Negative for shortness of breath.   Cardiovascular:  Negative for chest pain and palpitations.  Gastrointestinal:  Negative for blood in stool, constipation and diarrhea.  Endocrine: Negative for increased urination.  Genitourinary:  Negative for involuntary urination.  Musculoskeletal:  Positive for joint pain, joint pain and morning stiffness.  Negative for gait problem, joint swelling, myalgias, muscle weakness, muscle tenderness and myalgias.  Skin:  Negative for color change, rash, hair loss and sensitivity to sunlight.  Allergic/Immunologic: Negative for susceptible to infections.  Neurological:  Negative for dizziness and headaches.  Hematological:  Negative for swollen glands.  Psychiatric/Behavioral:  Negative for depressed mood and sleep disturbance. The patient is not nervous/anxious.     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   Psoriasis 12/10/2010   Knee pain, bilateral 10/30/2010   Hip pain, bilateral 10/30/2010   Leg length discrepancy 10/30/2010    Past Medical History:  Diagnosis Date   Anterior myocardial infarction 1995   Anxiety    CAD (coronary artery disease)    Angioplasty of LAD 1995   Essential hypertension    GERD (gastroesophageal reflux disease)    Psoriatic arthritis    Type 2 diabetes mellitus     Family History  Problem Relation Age of Onset   GER disease Mother    Heart disease Mother  Liver cancer Father    Arthritis Son    Psoriasis Son    Psoriasis Daughter    Past Surgical History:  Procedure Laterality Date   CARDIAC CATHETERIZATION     1995, 2001   COLONOSCOPY     COLONOSCOPY  07/04/2020   EYE SURGERY Bilateral    lasik   INSERTION OF MESH N/A 04/23/2016   Procedure: INSERTION OF MESH;  Surgeon: Ovidio Kin, MD;  Location: [facility];  Service: General;  Laterality: N/A;   KNEE ARTHROPLASTY     left knee arthroscopy     1990, 1997   MEDIAL  PARTIAL KNEE REPLACEMENT Right 05/2019   PARTIAL KNEE ARTHROPLASTY Left 02/16/2014   Procedure: LEFT KNEE UNI ARTHROPLASTY;  Surgeon: Eulas Post, MD;  Location: [facility];  Service: Orthopedics;  Laterality: Left;   UMBILICAL HERNIA REPAIR N/A 04/23/2016   Procedure: LAPAROSCOPIC UMBILICAL HERNIA;  Surgeon: Ovidio Kin, MD;  Location: [facility] OR;  Service: General;  Laterality: N/A;   Social History   Social History Narrative   Not on file   Immunization History  Administered Date(s) Administered   Influenza,inj,Quad PF,6+ Mos 03/19/2017, 03/24/2018   Influenza-Unspecified 03/18/2022   Rsv, Bivalent, Protein Subunit Rsvpref,pf Verdis Frederickson) 03/18/2022   Tdap 05/14/2018   Zoster Recombinat (Shingrix) 10/27/2021     Objective: Vital Signs: BP (!) 160/94 (BP Location: Left Arm, Patient Position: Sitting, Cuff Size: Normal)   Pulse (!) 58   Resp 15   Ht 5\' 8"  (1.727 m)   Wt 191 lb 9.6 oz (86.9 kg)   BMI 29.13 kg/m    Physical Exam Vitals and nursing note reviewed.  Constitutional:      Appearance: He is well-developed.  HENT:     Head: Normocephalic and atraumatic.  Eyes:     Conjunctiva/sclera: Conjunctivae normal.     Pupils: Pupils are equal, round, and reactive to light.  Cardiovascular:     Rate and Rhythm: Normal rate and regular rhythm.     Heart sounds: Normal heart sounds.  Pulmonary:     Effort: Pulmonary effort is normal.     Breath sounds: Normal breath sounds.  Abdominal:     General: Bowel sounds are normal.     Palpations: Abdomen is soft.  Musculoskeletal:     Cervical back: Normal range of motion and neck supple.  Skin:    General: Skin is warm and dry.     Capillary Refill: Capillary refill takes less than 2 seconds.  Neurological:     Mental Status: He is alert and oriented to person, place, and time.  Psychiatric:        Behavior: Behavior normal.      Musculoskeletal Exam: C-spine, thoracic spine, lumbar spine have good range of  motion.  No SI joint tenderness upon palpation.  Shoulder joints, elbow joints, wrist joints, MCPs, PIPs, DIPs have good range of motion with no synovitis.  Some tenderness over bilateral first MCP joints but no synovitis was noted.  No dactylitis noted.  Hip joints have good range of motion with no groin pain.  Partial knee replacements have good ROM.  Ankle joints have good range of motion with no tenderness or synovitis.  No evidence of Achilles tendinitis or plantar fasciitis.  CDAI Exam: CDAI Score: -- Patient Global: --; Provider Global: -- Swollen: --; Tender: -- Joint Exam 09/04/2022   No joint exam has been documented for this visit   There is currently no information documented on the homunculus. Go  to the Rheumatology activity and complete the homunculus joint exam.  Investigation: No additional findings.  Imaging: No results found.  Recent Labs: Lab Results  Component Value Date   WBC 6.7 04/02/2022   HGB 16.6 04/02/2022   PLT 274 04/02/2022   NA 135 04/02/2022   K 4.4 04/02/2022   CL 98 04/02/2022   CO2 27 04/02/2022   GLUCOSE 131 (H) 04/02/2022   BUN 18 04/02/2022   CREATININE 0.82 04/02/2022   BILITOT 0.9 04/02/2022   AST 25 05/13/2022   ALT 44 05/13/2022   PROT 7.4 04/02/2022   CALCIUM 9.7 04/02/2022   GFRAA 104 11/07/2020   QFTBGOLDPLUS NEGATIVE 10/06/2021    Speciality Comments: No specialty comments available.  Procedures:  No procedures performed Allergies: Aspirin and Codeine   Assessment / Plan:     Visit Diagnoses: Psoriatic arthropathy: No synovitis or dactylitis was noted on examination today.  He has some tenderness over the first MCP joints bilaterally but no active inflammation was noted.  No evidence of Achilles tendinitis or plantar fasciitis.  No SI joint pain.  He has not been experiencing any nocturnal symptoms or difficulty with ADLs.  Patient has been off of both Humira and methotrexate for the past 1 month due to an outbreak of cold  sores requiring 2 rounds of Valtrex followed by sinusitis requiring a round of antibiotics.  He plans on resuming both medications once his symptoms have completely cleared.  He was previously spacing Humira to every 21 days due to clinically doing well.  He was not experiencing any breakthrough symptoms while spacing Humira.  Plan on keeping him on Humira 40 mg subcutaneous injections every 21 days along with methotrexate 0.8 mg sq injections once weekly.  He is advised to notify us if he starts to have recurrent infections or recurrent flares.  He will follow-up in the office in 5 months or sooner if needed.  Psoriasis: He has no active psoriasis at this time.  High risk medication use - Humira 40 mg sq injections every 21 days, MTX 0.8 ml sq once weekly, and folic acid 2 mg daily.  TB gold negative on 10/06/21.  Order for TB gold released today. CBC and CMP updated on 05/02/22.  Orders for CBC and CMP released today.  His next lab work will be due in July and every 3 months.  Discussed the importance of holding humira and methotrexate if he develops signs or symptoms of an infection and to resume once the infection has completely cleared.  Discussed the importance of yearly skin examinations with dermatology due to the increased risk for nonmelanoma skin cancer while on Humira. - Plan: CBC with Differential/Platelet, COMPLETE METABOLIC PANEL WITH GFR, QuantiFERON-TB Gold Plus  Screening for tuberculosis - Order for TB gold released today. Plan: QuantiFERON-TB Gold Plus  Primary osteoarthritis of both hands: PIP and DIP thickening noted.  Tenderness over bilateral first MCP joints but no synovitis or dactylitis noted.  Discussed the importance of joint protection and muscle strengthening. Patient questioned if he could return for first MCP joint cortisone injections in the future if needed.  Discussed that these are usually performed under ultrasound guidance.  He will notify us if he develops  increased tenderness or inflammation.  Status post left partial knee replacement: Doing well.  Good range of motion with no discomfort.  Status post right partial knee replacement: Doing well.  Good range of motion with no discomfort.  Other medical conditions are listed as follows:  History of hyperlipidemia  History of hypertension: BP was 160/94 today in the office. BP was rechecked prior to leaving.  Advised patient to monitor blood pressure closely.   History of coronary artery disease  History of diabetes mellitus  Vitamin D deficiency    Orders: Orders Placed This Encounter  Procedures   CBC with Differential/Platelet   COMPLETE METABOLIC PANEL WITH GFR   QuantiFERON-TB Gold Plus   No orders of the defined types were placed in this encounter.   Follow-Up Instructions: Return in about 5 months (around 02/04/2023) for Psoriatic arthritis, Osteoarthritis.   Gearldine Bienenstock, PA-C  Note - This record has been created using Dragon software.  Chart creation errors have been sought, but may not always  have been located. Such creation errors do not reflect on  the standard of medical care.

## 2022-09-04 ENCOUNTER — Ambulatory Visit: Payer: Medicare PPO | Attending: Physician Assistant | Admitting: Physician Assistant

## 2022-09-04 ENCOUNTER — Encounter: Payer: Self-pay | Admitting: Physician Assistant

## 2022-09-04 VITALS — BP 160/94 | HR 58 | Resp 15 | Ht 68.0 in | Wt 191.6 lb

## 2022-09-04 DIAGNOSIS — Z8639 Personal history of other endocrine, nutritional and metabolic disease: Secondary | ICD-10-CM

## 2022-09-04 DIAGNOSIS — Z96652 Presence of left artificial knee joint: Secondary | ICD-10-CM

## 2022-09-04 DIAGNOSIS — L4050 Arthropathic psoriasis, unspecified: Secondary | ICD-10-CM

## 2022-09-04 DIAGNOSIS — Z96651 Presence of right artificial knee joint: Secondary | ICD-10-CM | POA: Diagnosis not present

## 2022-09-04 DIAGNOSIS — Z79899 Other long term (current) drug therapy: Secondary | ICD-10-CM

## 2022-09-04 DIAGNOSIS — M19041 Primary osteoarthritis, right hand: Secondary | ICD-10-CM

## 2022-09-04 DIAGNOSIS — Z111 Encounter for screening for respiratory tuberculosis: Secondary | ICD-10-CM

## 2022-09-04 DIAGNOSIS — E559 Vitamin D deficiency, unspecified: Secondary | ICD-10-CM

## 2022-09-04 DIAGNOSIS — L409 Psoriasis, unspecified: Secondary | ICD-10-CM

## 2022-09-04 DIAGNOSIS — Z8679 Personal history of other diseases of the circulatory system: Secondary | ICD-10-CM | POA: Diagnosis not present

## 2022-09-04 DIAGNOSIS — M19042 Primary osteoarthritis, left hand: Secondary | ICD-10-CM

## 2022-09-06 LAB — CBC WITH DIFFERENTIAL/PLATELET
Absolute Monocytes: 842 cells/uL (ref 200–950)
Basophils Absolute: 81 cells/uL (ref 0–200)
Basophils Relative: 1 %
Eosinophils Absolute: 81 cells/uL (ref 15–500)
Eosinophils Relative: 1 %
HCT: 50.7 % — ABNORMAL HIGH (ref 38.5–50.0)
Hemoglobin: 17.2 g/dL — ABNORMAL HIGH (ref 13.2–17.1)
Lymphs Abs: 3572 cells/uL (ref 850–3900)
MCH: 32 pg (ref 27.0–33.0)
MCHC: 33.9 g/dL (ref 32.0–36.0)
MCV: 94.2 fL (ref 80.0–100.0)
MPV: 10 fL (ref 7.5–12.5)
Monocytes Relative: 10.4 %
Neutro Abs: 3524 cells/uL (ref 1500–7800)
Neutrophils Relative %: 43.5 %
Platelets: 325 10*3/uL (ref 140–400)
RBC: 5.38 10*6/uL (ref 4.20–5.80)
RDW: 13.1 % (ref 11.0–15.0)
Total Lymphocyte: 44.1 %
WBC: 8.1 10*3/uL (ref 3.8–10.8)

## 2022-09-06 LAB — COMPLETE METABOLIC PANEL WITH GFR
AG Ratio: 1.3 (calc) (ref 1.0–2.5)
ALT: 44 U/L (ref 9–46)
AST: 27 U/L (ref 10–35)
Albumin: 4.3 g/dL (ref 3.6–5.1)
Alkaline phosphatase (APISO): 60 U/L (ref 35–144)
BUN: 20 mg/dL (ref 7–25)
CO2: 29 mmol/L (ref 20–32)
Calcium: 9.6 mg/dL (ref 8.6–10.3)
Chloride: 96 mmol/L — ABNORMAL LOW (ref 98–110)
Creat: 0.85 mg/dL (ref 0.70–1.35)
Globulin: 3.3 g/dL (calc) (ref 1.9–3.7)
Glucose, Bld: 140 mg/dL — ABNORMAL HIGH (ref 65–99)
Potassium: 4.2 mmol/L (ref 3.5–5.3)
Sodium: 136 mmol/L (ref 135–146)
Total Bilirubin: 1 mg/dL (ref 0.2–1.2)
Total Protein: 7.6 g/dL (ref 6.1–8.1)
eGFR: 95 mL/min/{1.73_m2} (ref >=60)

## 2022-09-06 LAB — QUANTIFERON-TB GOLD PLUS
Mitogen-NIL: >10 IU/mL
NIL: 0.04 IU/mL
QuantiFERON-TB Gold Plus: NEGATIVE
TB1-NIL: 0.01 IU/mL
TB2-NIL: 0 IU/mL

## 2022-09-07 NOTE — Progress Notes (Signed)
TB gold negative  Glucose is 140. Rest of CMP WNL.  Hgb and hct are borderline elevated. Rest of CBC WNL.  We will continue to monitor

## 2022-09-24 ENCOUNTER — Other Ambulatory Visit: Payer: Self-pay

## 2022-09-24 MED ORDER — PREDNISONE 5 MG PO TABS: ORAL_TABLET | ORAL | 0 refills | Status: DC

## 2022-09-24 NOTE — Addendum Note (Signed)
Addended by: Metta Clines on: 09/24/2022 04:26 PM   Modules accepted: Orders

## 2022-09-24 NOTE — Telephone Encounter (Signed)
I called the patient to discuss the symptoms he is experiencing.  He his having a flare involving both hands.  He has not had any gaps in therapy but has been performing overuse activities.  He has not taken any OTC products for pain relief.  He avoids the use of NSAIDs.   Ok to send in prednisone 20 mg tapering by 5 mg every 2 days.dispense 20 tablets with zero refills.    Instructions were provided to the patient. He plans on checking his blood sugar closely while taking prednisone.  He will also watch his sugar/carb intake.    He will notify us if his symptoms persist or worsen.

## 2022-09-24 NOTE — Telephone Encounter (Signed)
Patient states he is having a major flare of both hands. Patient states he has used gloves and compression but it has not helped. Patient states he has been doing a lot of work outside and has been wearing compression gloves at night. Patient states he has not taken any medication and both hands are sore to the touch. Patient states he would like some prednisone and has taken in the past. Patient call back number is [phone]. Please advise.

## 2022-09-24 NOTE — Telephone Encounter (Signed)
Please review and send prescription

## 2022-09-25 ENCOUNTER — Other Ambulatory Visit: Payer: Self-pay | Admitting: Physician Assistant

## 2022-09-25 DIAGNOSIS — L4050 Arthropathic psoriasis, unspecified: Secondary | ICD-10-CM

## 2022-09-25 NOTE — Telephone Encounter (Signed)
Last Fill: 04/20/2022  Labs: 09/04/2022 Glucose is 140. Rest of CMP WNL. Hgb and hct are borderline elevated. Rest of CBC WNL.   TB Gold: 09/04/2022 Neg    Next Visit: 02/05/2023  Last Visit: 09/04/2022  ZO:XWRUEAVWU arthropathy   Current Dose per office note 09/04/2022: Humira 40 mg sq injections every 21 days   Okay to refill Humira?

## 2022-09-29 ENCOUNTER — Telehealth: Payer: Self-pay | Admitting: Pharmacist

## 2022-09-29 NOTE — Telephone Encounter (Signed)
Submitted a Prior Authorization renewal request to [facility] for HUMIRA via CoverMyMeds. Will update once we receive a response.  Key: Kendal Hymen

## 2022-09-30 NOTE — Telephone Encounter (Signed)
Received notification from [facility] regarding a prior authorization for HUMIRA. Authorization has been APPROVED from 09/30/22 to 05/25/23. Approval letter sent to scan center.  Patient can continue to fill through [employer]: [phone]  Phone # [phone]

## 2022-10-12 ENCOUNTER — Other Ambulatory Visit: Payer: Self-pay | Admitting: Rheumatology

## 2022-10-12 NOTE — Telephone Encounter (Signed)
Last Fill: 05/14/2022  Labs: 09/04/2022 Glucose is 140. Rest of CMP WNL. Hgb and hct are borderline elevated. Rest of CBC WNL.  Next Visit: 02/05/2023  Last Visit: 09/04/2022  DX:  Psoriatic arthropathy   Current Dose per office note 09/04/2022: MTX 0.8 ml sq once weekly   Okay to refill Methotrexate?

## 2022-10-21 ENCOUNTER — Telehealth: Payer: Self-pay

## 2022-10-21 NOTE — Telephone Encounter (Signed)
Byrd Hesselbach from [facility] contacted the office to clarify patient's frequency change of Humira. Advised per last office note, patient should be on  Humira 40 mg sq injections every 21 days. Byrd Hesselbach verbalized understanding. Byrd Hesselbach states they will fill the prescription with 2 pens and 1 refill.

## 2022-11-02 ENCOUNTER — Other Ambulatory Visit: Payer: Self-pay | Admitting: Physician Assistant

## 2022-11-02 NOTE — Telephone Encounter (Signed)
Last Fill: 01/09/2023  Next Visit: 02/05/2023  Last Visit: 09/04/2022  Dx: Psoriatic arthropathy   Current Dose per office note on 4/122/2024: folic acid 2 mg daily.   Okay to refill Folic Acid?

## 2022-11-17 ENCOUNTER — Other Ambulatory Visit: Payer: Self-pay

## 2022-11-17 DIAGNOSIS — L4050 Arthropathic psoriasis, unspecified: Secondary | ICD-10-CM

## 2022-11-17 MED ORDER — HUMIRA (2 PEN) 40 MG/0.4ML ~~LOC~~ AJKT
40.0000 mg | AUTO-INJECTOR | SUBCUTANEOUS | 1 refills | Status: DC
Start: 2022-11-17 — End: 2023-02-01

## 2022-11-17 NOTE — Telephone Encounter (Signed)
Patient contacted the office and states he would like a refill of Humira sent to [facility].   Last Fill: 09/25/2022  Labs: 09/04/2022 Glucose is 140. Rest of CMP WNL. Hgb and hct are borderline elevated. Rest of CBC WNL.  We will continue to monitor  TB Gold: 09/04/2022  TB gold negative   Next Visit: 02/05/2023  Last Visit: 09/04/2022  ZO:XWRUEAVWU arthropathy   Current Dose per office note 09/04/2022: Humira 40 mg sq injections every 21 days   Okay to refill Humira?

## 2022-11-19 DIAGNOSIS — I10 Essential (primary) hypertension: Secondary | ICD-10-CM | POA: Diagnosis not present

## 2022-11-19 DIAGNOSIS — I152 Hypertension secondary to endocrine disorders: Secondary | ICD-10-CM | POA: Diagnosis not present

## 2022-11-19 DIAGNOSIS — Z299 Encounter for prophylactic measures, unspecified: Secondary | ICD-10-CM | POA: Diagnosis not present

## 2022-11-19 DIAGNOSIS — E1159 Type 2 diabetes mellitus with other circulatory complications: Secondary | ICD-10-CM | POA: Diagnosis not present

## 2022-11-25 ENCOUNTER — Ambulatory Visit: Payer: Medicare PPO

## 2022-11-25 ENCOUNTER — Ambulatory Visit (INDEPENDENT_AMBULATORY_CARE_PROVIDER_SITE_OTHER): Payer: Medicare PPO

## 2022-11-25 ENCOUNTER — Ambulatory Visit: Payer: Medicare PPO | Attending: Rheumatology | Admitting: Rheumatology

## 2022-11-25 DIAGNOSIS — M79642 Pain in left hand: Secondary | ICD-10-CM | POA: Diagnosis not present

## 2022-11-25 DIAGNOSIS — M19041 Primary osteoarthritis, right hand: Secondary | ICD-10-CM

## 2022-11-25 DIAGNOSIS — M19042 Primary osteoarthritis, left hand: Secondary | ICD-10-CM | POA: Diagnosis not present

## 2022-11-25 DIAGNOSIS — M79641 Pain in right hand: Secondary | ICD-10-CM

## 2022-11-25 MED ORDER — LIDOCAINE HCL 1 % IJ SOLN
0.5000 mL | INTRAMUSCULAR | Status: AC | PRN
Start: 2022-11-25 — End: 2022-11-25
  Administered 2022-11-25: .5 mL

## 2022-11-25 MED ORDER — TRIAMCINOLONE ACETONIDE 40 MG/ML IJ SUSP
20.0000 mg | INTRAMUSCULAR | Status: AC | PRN
  Administered 2022-11-25: 20 mg via INTRA_ARTICULAR

## 2022-11-25 MED ORDER — TRIAMCINOLONE ACETONIDE 40 MG/ML IJ SUSP
20.0000 mg | INTRAMUSCULAR | Status: AC | PRN
Start: 2022-11-25 — End: 2022-11-25
  Administered 2022-11-25: 20 mg via INTRA_ARTICULAR

## 2022-11-25 NOTE — Progress Notes (Signed)
Procedure Note  Patient: Brandon Frazier             Date of Birth: 11-10-1955           MRN: [id number]             Visit Date: 11/25/2022  Procedures: Visit Diagnoses:  1. Pain in both hands   2. Primary osteoarthritis of both hands   Patient has been experiencing severe pain and discomfort in bilateral first MCP joints.  He states he has been waking up in the middle of the night due to pain.  On examination today he did not have any synovitis.  He has been full range of motion.  The skin was intact without any redness or infection.  Ultrasound guided injection is preferred based studies that show increased duration, increased effect, greater accuracy, decreased procedural pain, increased response rate, and decreased cost with ultrasound guided versus blind injection.   Verbal informed consent obtained.  Time-out conducted.  Noted no overlying erythema, induration, or other signs of local infection. Ultrasound-guided MCP injection: After sterile prep with Betadine, injected 0.5 mL of 1% lidocaine and 20mg  Kenalog using a 27 needle, in the MCP joint.    Small Joint Inj: bilateral thumb MCP on 11/25/2022 10:16 AM Indications: pain Details: 27 G needle, ultrasound-guided dorsal approach  Spinal Needle: No  Medications (Right): 0.5 mL lidocaine 1 %; 20 mg triamcinolone acetonide 40 MG/ML Aspirate (Right): 0 mL Medications (Left): 0.5 mL lidocaine 1 %; 20 mg triamcinolone acetonide 40 MG/ML Aspirate (Left): 0 mL Outcome: tolerated well, no immediate complications Consent was given by the patient. Immediately prior to procedure a time out was called to verify the correct patient, procedure, equipment, support staff and site/side marked as required. Patient was prepped and draped in the usual sterile fashion.     Patient tolerated the procedure well.  Postprocedure instructions were given. Pollyann Savoy, MD

## 2023-01-22 NOTE — Progress Notes (Deleted)
Office Visit Note  Patient: Brandon Frazier             Date of Birth: 1956-04-08           MRN: [id number]             PCP: Ignatius Specking, MD Referring: Ignatius Specking, MD Visit Date: 02/05/2023 Occupation: @GUAROCC @  Subjective:  No chief complaint on file.   History of Present Illness: Brandon Frazier is a 67 y.o. male ***     Activities of Daily Living:  Patient reports morning stiffness for *** {minute/hour:19697}.   Patient {ACTIONS;DENIES/REPORTS:21021675::"Denies"} nocturnal pain.  Difficulty dressing/grooming: {ACTIONS;DENIES/REPORTS:21021675::"Denies"} Difficulty climbing stairs: {ACTIONS;DENIES/REPORTS:21021675::"Denies"} Difficulty getting out of chair: {ACTIONS;DENIES/REPORTS:21021675::"Denies"} Difficulty using hands for taps, buttons, cutlery, and/or writing: {ACTIONS;DENIES/REPORTS:21021675::"Denies"}  No Rheumatology ROS completed.   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   Psoriasis 12/10/2010   Knee pain, bilateral 10/30/2010   Hip pain, bilateral 10/30/2010   Leg length discrepancy 10/30/2010    Past Medical History:  Diagnosis Date   Anterior myocardial infarction [facility]) 1995   Anxiety    CAD (coronary artery disease)    Angioplasty of LAD 1995   Essential hypertension    GERD (gastroesophageal reflux disease)    Psoriatic arthritis (HCC)    Type 2 diabetes mellitus (HCC)     Family History  Problem Relation Age of Onset   GER disease Mother    Heart disease Mother    Liver cancer Father    Arthritis Son     Psoriasis Son    Psoriasis Daughter    Past Surgical History:  Procedure Laterality Date   CARDIAC CATHETERIZATION     1995, 2001   COLONOSCOPY     COLONOSCOPY  07/04/2020   EYE SURGERY Bilateral    lasik   INSERTION OF MESH N/A 04/23/2016   Procedure: INSERTION OF MESH;  Surgeon: Ovidio Kin, MD;  Location: [facility] OR;  Service: General;  Laterality: N/A;   KNEE ARTHROPLASTY     left knee arthroscopy     1990, 1997   MEDIAL PARTIAL KNEE REPLACEMENT Right 05/2019   PARTIAL KNEE ARTHROPLASTY Left 02/16/2014   Procedure: LEFT KNEE UNI ARTHROPLASTY;  Surgeon: Eulas Post, MD;  Location: [facility];  Service: Orthopedics;  Laterality: Left;   UMBILICAL HERNIA REPAIR N/A 04/23/2016   Procedure: LAPAROSCOPIC UMBILICAL HERNIA;  Surgeon: Ovidio Kin, MD;  Location: [facility] OR;  Service: General;  Laterality: N/A;   Social History   Social History Narrative   Not on file   Immunization History  Administered Date(s) Administered   Influenza,inj,Quad PF,6+ Mos 03/19/2017, 03/24/2018   Influenza-Unspecified 03/18/2022   Rsv, Bivalent, Protein Subunit Rsvpref,pf Verdis Frederickson) 03/18/2022   Tdap 05/14/2018   Zoster Recombinant(Shingrix) 10/27/2021     Objective: Vital Signs: There were no vitals taken for this visit.   Physical Exam   Musculoskeletal Exam: ***  CDAI Exam: CDAI Score: -- Patient Global: --; Provider Global: -- Swollen: --; Tender: -- Joint Exam 02/05/2023   No joint exam has been documented for this  visit   There is currently no information documented on the homunculus. Go to the Rheumatology activity and complete the homunculus joint exam.  Investigation: No additional findings.  Imaging: No results found.  Recent Labs: Lab Results  Component Value Date   WBC 8.1 09/04/2022   HGB 17.2 (H) 09/04/2022   PLT 325 09/04/2022   NA 136 09/04/2022   K 4.2 09/04/2022   CL 96 (L) 09/04/2022   CO2 29 09/04/2022   GLUCOSE 140 (H) 09/04/2022   BUN 20  09/04/2022   CREATININE 0.85 09/04/2022   BILITOT 1.0 09/04/2022   AST 27 09/04/2022   ALT 44 09/04/2022   PROT 7.6 09/04/2022   CALCIUM 9.6 09/04/2022   GFRAA 104 11/07/2020   QFTBGOLDPLUS NEGATIVE 09/04/2022    Speciality Comments: No specialty comments available.  Procedures:  No procedures performed Allergies: Aspirin and Codeine   Assessment / Plan:     Visit Diagnoses: Psoriatic arthropathy (HCC)  Psoriasis  High risk medication use  Primary osteoarthritis of both hands  Status post left partial knee replacement  Status post right partial knee replacement  History of hyperlipidemia  History of hypertension  History of coronary artery disease  History of diabetes mellitus  Vitamin D deficiency  Orders: No orders of the defined types were placed in this encounter.  No orders of the defined types were placed in this encounter.   Face-to-face time spent with patient was *** minutes. Greater than 50% of time was spent in counseling and coordination of care.  Follow-Up Instructions: No follow-ups on file.   Gearldine Bienenstock, PA-C  Note - This record has been created using Dragon software.  Chart creation errors have been sought, but may not always  have been located. Such creation errors do not reflect on  the standard of medical care.

## 2023-01-28 DIAGNOSIS — E291 Testicular hypofunction: Secondary | ICD-10-CM | POA: Diagnosis not present

## 2023-01-28 DIAGNOSIS — R350 Frequency of micturition: Secondary | ICD-10-CM | POA: Diagnosis not present

## 2023-01-28 DIAGNOSIS — R948 Abnormal results of function studies of other organs and systems: Secondary | ICD-10-CM | POA: Diagnosis not present

## 2023-01-28 DIAGNOSIS — R3912 Poor urinary stream: Secondary | ICD-10-CM | POA: Diagnosis not present

## 2023-01-28 DIAGNOSIS — N529 Male erectile dysfunction, unspecified: Secondary | ICD-10-CM | POA: Diagnosis not present

## 2023-01-30 ENCOUNTER — Other Ambulatory Visit: Payer: Self-pay | Admitting: Physician Assistant

## 2023-01-30 DIAGNOSIS — L4050 Arthropathic psoriasis, unspecified: Secondary | ICD-10-CM

## 2023-02-01 NOTE — Telephone Encounter (Signed)
Last Fill: 11/17/2022  Labs: 09/04/2022 Glucose is 140. Rest of CMP WNL. Hgb and hct are borderline elevated. Rest of CBC WNL.  We will continue to monitor  TB Gold: 09/04/2022  TB gold negative    Next Visit: 02/05/2023  Last Visit: 09/04/2022  DX: Psoriatic arthropathy   Current Dose per office note 09/04/2022: Humira 40 mg sq injections every 21 days   Patient to update labs at upcoming appointment on 02/05/2023  Okay to refill Humira?

## 2023-02-05 ENCOUNTER — Ambulatory Visit: Payer: Medicare PPO | Admitting: Physician Assistant

## 2023-02-05 DIAGNOSIS — L4050 Arthropathic psoriasis, unspecified: Secondary | ICD-10-CM

## 2023-02-05 DIAGNOSIS — Z96652 Presence of left artificial knee joint: Secondary | ICD-10-CM

## 2023-02-05 DIAGNOSIS — M19041 Primary osteoarthritis, right hand: Secondary | ICD-10-CM

## 2023-02-05 DIAGNOSIS — Z79899 Other long term (current) drug therapy: Secondary | ICD-10-CM

## 2023-02-05 DIAGNOSIS — Z8679 Personal history of other diseases of the circulatory system: Secondary | ICD-10-CM

## 2023-02-05 DIAGNOSIS — L409 Psoriasis, unspecified: Secondary | ICD-10-CM

## 2023-02-05 DIAGNOSIS — Z8639 Personal history of other endocrine, nutritional and metabolic disease: Secondary | ICD-10-CM

## 2023-02-05 DIAGNOSIS — E559 Vitamin D deficiency, unspecified: Secondary | ICD-10-CM

## 2023-02-05 DIAGNOSIS — Z96651 Presence of right artificial knee joint: Secondary | ICD-10-CM

## 2023-02-16 NOTE — Progress Notes (Addendum)
Office Visit Note  Patient: Brandon Frazier             Date of Birth: Aug 27, 1955           MRN: [id number]             PCP: Ignatius Specking, MD Referring: Ignatius Specking, MD Visit Date: 03/02/2023 Occupation: @GUAROCC @  Subjective:  Right shoulder joint pain   History of Present Illness: Brandon Frazier is a 67 y.o. male with history of psoriatic arthritis and osteoarthritis.  He remains on Humira 40 mg sq injections every 21 days, MTX 0.8 ml sq once weekly, and folic acid 2 mg daily.  Patient denies any interruptions in therapy.  He denies any recent or recurrent infections.  Patient states he has started walking 2 miles on a daily basis for exercise.  He states about 2 and half weeks ago he was performing yard work and using a hand saw to cut tree branches and has had persistent discomfort in his right shoulder since then.  He has tried KT tape as well as heat which has provide some relief but his symptoms have continued.  Patient states he is also having increased discomfort in the left CMC joint.  He denies any joint swelling.  He states that the first MCP joint cortisone injections performed on 11/25/2022 have resolved his pain. He denies any Achilles tendinitis or plantar fasciitis.  He denies any SI joint pain.  He denies any signs or symptoms of uveitis.     Activities of Daily Living:  Patient reports morning stiffness for a few minutes.   Patient Denies nocturnal pain.  Difficulty dressing/grooming: Denies Difficulty climbing stairs: Denies Difficulty getting out of chair: Denies Difficulty using hands for taps, buttons, cutlery, and/or writing: Denies  Review of Systems  Constitutional:  Negative for fatigue.  HENT:  Negative for mouth sores and mouth dryness.   Eyes:  Negative for dryness.  Respiratory:  Negative for shortness of breath.   Cardiovascular:  Negative for chest pain and palpitations.  Gastrointestinal:  Negative for blood in stool, constipation and diarrhea.   Endocrine: Negative for increased urination.  Genitourinary:  Negative for involuntary urination.  Musculoskeletal:  Positive for joint pain, joint pain, joint swelling, myalgias, morning stiffness, muscle tenderness and myalgias. Negative for gait problem and muscle weakness.  Skin:  Negative for color change, rash and sensitivity to sunlight.  Allergic/Immunologic: Negative for susceptible to infections.  Neurological:  Negative for dizziness and headaches.  Hematological:  Negative for swollen glands.  Psychiatric/Behavioral:  Negative for depressed mood and sleep disturbance. The patient is not nervous/anxious.     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   Psoriasis 12/10/2010   Knee pain, bilateral 10/30/2010   Hip pain, bilateral 10/30/2010   Leg length discrepancy 10/30/2010    Past Medical History:  Diagnosis Date   Anterior myocardial infarction (HCC) 1995   Anxiety    CAD (coronary artery disease)    Angioplasty of LAD 1995   Essential hypertension    GERD (gastroesophageal reflux  disease)    Psoriatic arthritis (HCC)    Type 2 diabetes mellitus (HCC)     Family History  Problem Relation Age of Onset   GER disease Mother    Heart disease Mother    Liver cancer Father    Arthritis Son    Psoriasis Son    Psoriasis Daughter    Past Surgical History:  Procedure Laterality Date   CARDIAC CATHETERIZATION     1995, 2001   COLONOSCOPY     COLONOSCOPY  07/04/2020   EYE SURGERY Bilateral    lasik   INSERTION OF MESH N/A  04/23/2016   Procedure: INSERTION OF MESH;  Surgeon: Ovidio Kin, MD;  Location: [facility] OR;  Service: General;  Laterality: N/A;   KNEE ARTHROPLASTY     left knee arthroscopy     1990, 1997   MEDIAL PARTIAL KNEE REPLACEMENT Right 05/2019   PARTIAL KNEE ARTHROPLASTY Left 02/16/2014   Procedure: LEFT KNEE UNI ARTHROPLASTY;  Surgeon: Eulas Post, MD;  Location: [facility];  Service: Orthopedics;  Laterality: Left;   UMBILICAL HERNIA REPAIR N/A 04/23/2016   Procedure: LAPAROSCOPIC UMBILICAL HERNIA;  Surgeon: Ovidio Kin, MD;  Location: [facility] OR;  Service: General;  Laterality: N/A;   Social History   Social History Narrative   Not on file   Immunization History  Administered Date(s) Administered   Influenza,inj,Quad PF,6+ Mos 03/19/2017, 03/24/2018   Influenza-Unspecified 03/18/2022   Rsv, Bivalent, Protein Subunit Rsvpref,pf Verdis Frederickson) 03/18/2022   Tdap 05/14/2018   Zoster Recombinant(Shingrix) 10/27/2021     Objective: Vital Signs: BP (!) 155/86 (BP Location: Left Arm, Patient Position: Sitting, Cuff Size: Normal)   Pulse 67   Resp 15   Ht 5\' 8"  (1.727 m)   Wt 190 lb (86.2 kg)   BMI 28.89 kg/m    Physical Exam Vitals and nursing note reviewed.  Constitutional:      Appearance: He is well-developed.  HENT:     Head: Normocephalic and atraumatic.  Eyes:     Conjunctiva/sclera: Conjunctivae normal.     Pupils: Pupils are equal, round, and reactive to light.  Cardiovascular:     Rate and Rhythm: Normal rate and regular rhythm.     Heart sounds: Normal heart sounds.  Pulmonary:     Effort: Pulmonary effort is normal.     Breath sounds: Normal breath sounds.  Abdominal:     General: Bowel sounds are normal.     Palpations: Abdomen is soft.  Musculoskeletal:     Cervical back: Normal range of motion and neck supple.  Skin:    General: Skin is warm and dry.     Capillary Refill: Capillary refill takes less than 2 seconds.  Neurological:     Mental Status:  He is alert and oriented to person, place, and time.  Psychiatric:        Behavior: Behavior normal.      Musculoskeletal Exam: C-spine, thoracic pain, lumbar spine good range of motion.  No midline spinal tenderness.  No SI joint tenderness.  Right shoulder is painful and limited internal rotation.  Left shoulder has full range of motion with no discomfort.  Elbow joints, wrist joints, MCPs, PIPs, DIPs have good range of motion with no synovitis.  Tenderness over the left CMC joint.  Hip joints have good range of motion with no groin pain. Partial knee replacements have good range of motion with no warmth or effusion.  Ankle joints have good range of motion with no tenderness  or synovitis.  No evidence of Achilles tendinitis.  CDAI Exam: CDAI Score: -- Patient Global: --; Provider Global: -- Swollen: --; Tender: -- Joint Exam 03/02/2023   No joint exam has been documented for this visit   There is currently no information documented on the homunculus. Go to the Rheumatology activity and complete the homunculus joint exam.  Investigation: No additional findings.  Imaging: XR Shoulder Right  Result Date: 03/02/2023 No glenohumeral or acromioclavicular joint space narrowing was noted.  No chondrocalcinosis was noted. Impression: Unremarkable x-rays of the shoulder.   Recent Labs: Lab Results  Component Value Date   WBC 8.1 09/04/2022   HGB 17.2 (H) 09/04/2022   PLT 325 09/04/2022   NA 136 09/04/2022   K 4.2 09/04/2022   CL 96 (L) 09/04/2022   CO2 29 09/04/2022   GLUCOSE 140 (H) 09/04/2022   BUN 20 09/04/2022   CREATININE 0.85 09/04/2022   BILITOT 1.0 09/04/2022   AST 27 09/04/2022   ALT 44 09/04/2022   PROT 7.6 09/04/2022   CALCIUM 9.6 09/04/2022   GFRAA 104 11/07/2020   QFTBGOLDPLUS NEGATIVE 09/04/2022    Speciality Comments: No specialty comments available.  Procedures:  Large Joint Inj: R glenohumeral on 03/02/2023 2:44 PM Indications: pain Details: 27 G 1.5 in  needle, posterior approach  Arthrogram: No  Medications: 1 mL lidocaine 1 %; 40 mg triamcinolone acetonide 40 MG/ML Aspirate: 0 mL Outcome: tolerated well, no immediate complications Procedure, treatment alternatives, risks and benefits explained, specific risks discussed. Consent was given by the patient. Immediately prior to procedure a time out was called to verify the correct patient, procedure, equipment, support staff and site/side marked as required. Patient was prepped and draped in the usual sterile fashion.     Allergies: Aspirin and Codeine     Assessment / Plan:     Visit Diagnoses: Psoriatic arthropathy (HCC): He has no synovitis or dactylitis on examination today.  He has not had any signs or symptoms of a psoriatic arthritis flare.  He has clinically been doing well on Humira 40 mg sq injections every 21 days and methotrexate 0.8 mL sq injections once weekly.  He has not had any interruptions in therapy.  He has no SI joint tenderness upon palpation.  No evidence of Achilles tendinitis or plantar fasciitis.  No signs or symptoms of uveitis.  He has been active performing yard work as well as walking 2 miles daily for exercise.  He has been performing some overuse activities including using a hand saw and the yard which is caused some tendinitis and bursitis involving his right shoulder.  X-rays of the right shoulder were obtained today which were unremarkable.  The right glenohumeral joint was injected with cortisone.  He tolerated procedure well.  Procedure note was completed above.  Aftercare was discussed. He will remain on Humira and methotrexate as combination therapy.  No dose changes will be made at this time.  He was advised to notify us if he develops signs or symptoms of a flare.  He will follow-up in the office in 5 months or sooner if needed.  Psoriasis:  No active psoriasis at this time.   High risk medication use - Humira 40 mg sq injections every 21 days, MTX 0.8 ml  sq once weekly, and folic acid 2 mg daily.  CBC and CMP updated on 09/04/22. Orders for CBC and CMP released today.  His next lab work will be due in January and every 3 months to monitor  for drug toxicity.  Standing orders for CBC and CMP were placed today. TB gold negative 09/04/22.  No recent or recurrent infections.  Discussed the importance of holding humira and methotrexate if he develops signs or symptoms of an infection and to resume once the infection has completely cleared.  Plan: CBC with Differential/Platelet, COMPLETE METABOLIC PANEL WITH GFR, CBC with Differential/Platelet, COMPLETE METABOLIC PANEL WITH GFR  Acute pain of right shoulder -Patient presents today with right shoulder pain x2.5 weeks.  Patient was using a hand saw clipping tree limbs, which exacerbated his symptoms.  He has tried KT tape and heat which have provided minimal relief.  X-rays of the right shoulder were obtained today for further evaluation. XR unremarkable.  After informed consent the right glenohumeral joint was injected with cortisone.  He tolerated procedure well.  Procedure note was completed above.  Aftercare was discussed.  Patient was advised to monitor blood pressure closely found a cortisone injection today.  He was advised to notify us if his symptoms persist or worsen.  Handout of exercises were provided to the patient to start once his symptoms have resolved to improve ROM.  Plan: XR Shoulder Right  Primary osteoarthritis of both hands: PIP and DIP thickening consistent with OA of both hands.  No synovitis noted.  Patient presents today with increased discomfort involving the left CMC joint.  Patient will notify us if his symptoms persist or worsen at which time we can schedule ultrasound-guided left [facility] joint cortisone injection in the future.  Status post left partial knee replacement: Doing well.  Good range of motion with no discomfort.  No warmth or effusion noted.  Status post right partial knee  replacement: Good range of motion of the right knee with no warmth or effusion.  Other medical conditions are listed as follows:  History of hyperlipidemia  History of hypertension: Blood pressure was slightly elevated today in the office as rechecked prior to the patient leaving.  Patient was advised to monitor blood pressure closely and to follow-up with his PCP if his BP remains elevated.   History of coronary artery disease  History of diabetes mellitus  Vitamin D deficiency   Orders: Orders Placed This Encounter  Procedures   Large Joint Inj: R subacromial bursa   XR Shoulder Right   CBC with Differential/Platelet   COMPLETE METABOLIC PANEL WITH GFR   CBC with Differential/Platelet   COMPLETE METABOLIC PANEL WITH GFR   No orders of the defined types were placed in this encounter.    Follow-Up Instructions: Return in about 5 months (around 07/31/2023) for Psoriatic arthritis.   Gearldine Bienenstock, PA-C  Note - This record has been created using Dragon software.  Chart creation errors have been sought, but may not always  have been located. Such creation errors do not reflect on  the standard of medical care.

## 2023-02-23 DIAGNOSIS — Z1331 Encounter for screening for depression: Secondary | ICD-10-CM | POA: Diagnosis not present

## 2023-02-23 DIAGNOSIS — R5383 Other fatigue: Secondary | ICD-10-CM | POA: Diagnosis not present

## 2023-02-23 DIAGNOSIS — Z79899 Other long term (current) drug therapy: Secondary | ICD-10-CM | POA: Diagnosis not present

## 2023-02-23 DIAGNOSIS — E039 Hypothyroidism, unspecified: Secondary | ICD-10-CM | POA: Diagnosis not present

## 2023-02-23 DIAGNOSIS — Z0000 Encounter for general adult medical examination without abnormal findings: Secondary | ICD-10-CM | POA: Diagnosis not present

## 2023-02-23 DIAGNOSIS — I10 Essential (primary) hypertension: Secondary | ICD-10-CM | POA: Diagnosis not present

## 2023-02-23 DIAGNOSIS — Z125 Encounter for screening for malignant neoplasm of prostate: Secondary | ICD-10-CM | POA: Diagnosis not present

## 2023-02-23 DIAGNOSIS — F339 Major depressive disorder, recurrent, unspecified: Secondary | ICD-10-CM | POA: Diagnosis not present

## 2023-02-23 DIAGNOSIS — Z7189 Other specified counseling: Secondary | ICD-10-CM | POA: Diagnosis not present

## 2023-02-23 DIAGNOSIS — E1159 Type 2 diabetes mellitus with other circulatory complications: Secondary | ICD-10-CM | POA: Diagnosis not present

## 2023-02-23 DIAGNOSIS — Z1339 Encounter for screening examination for other mental health and behavioral disorders: Secondary | ICD-10-CM | POA: Diagnosis not present

## 2023-02-23 DIAGNOSIS — E7800 Pure hypercholesterolemia, unspecified: Secondary | ICD-10-CM | POA: Diagnosis not present

## 2023-02-23 DIAGNOSIS — Z299 Encounter for prophylactic measures, unspecified: Secondary | ICD-10-CM | POA: Diagnosis not present

## 2023-03-01 ENCOUNTER — Other Ambulatory Visit: Payer: Self-pay | Admitting: Physician Assistant

## 2023-03-02 ENCOUNTER — Ambulatory Visit: Payer: Medicare PPO | Attending: Physician Assistant | Admitting: Physician Assistant

## 2023-03-02 ENCOUNTER — Ambulatory Visit (INDEPENDENT_AMBULATORY_CARE_PROVIDER_SITE_OTHER): Payer: Medicare PPO

## 2023-03-02 ENCOUNTER — Other Ambulatory Visit: Payer: Self-pay

## 2023-03-02 ENCOUNTER — Encounter: Payer: Self-pay | Admitting: Physician Assistant

## 2023-03-02 VITALS — BP 155/86 | HR 67 | Resp 15 | Ht 68.0 in | Wt 190.0 lb

## 2023-03-02 DIAGNOSIS — Z8639 Personal history of other endocrine, nutritional and metabolic disease: Secondary | ICD-10-CM | POA: Diagnosis not present

## 2023-03-02 DIAGNOSIS — E559 Vitamin D deficiency, unspecified: Secondary | ICD-10-CM

## 2023-03-02 DIAGNOSIS — L409 Psoriasis, unspecified: Secondary | ICD-10-CM

## 2023-03-02 DIAGNOSIS — Z96651 Presence of right artificial knee joint: Secondary | ICD-10-CM | POA: Diagnosis not present

## 2023-03-02 DIAGNOSIS — Z96652 Presence of left artificial knee joint: Secondary | ICD-10-CM | POA: Diagnosis not present

## 2023-03-02 DIAGNOSIS — Z79899 Other long term (current) drug therapy: Secondary | ICD-10-CM | POA: Diagnosis not present

## 2023-03-02 DIAGNOSIS — L4050 Arthropathic psoriasis, unspecified: Secondary | ICD-10-CM

## 2023-03-02 DIAGNOSIS — M19042 Primary osteoarthritis, left hand: Secondary | ICD-10-CM

## 2023-03-02 DIAGNOSIS — M19041 Primary osteoarthritis, right hand: Secondary | ICD-10-CM | POA: Diagnosis not present

## 2023-03-02 DIAGNOSIS — M25511 Pain in right shoulder: Secondary | ICD-10-CM | POA: Diagnosis not present

## 2023-03-02 DIAGNOSIS — Z8679 Personal history of other diseases of the circulatory system: Secondary | ICD-10-CM

## 2023-03-02 MED ORDER — HUMIRA (2 PEN) 40 MG/0.4ML ~~LOC~~ AJKT: 40.0000 mg | AUTO-INJECTOR | SUBCUTANEOUS | 0 refills | Status: DC

## 2023-03-02 MED ORDER — TRIAMCINOLONE ACETONIDE 40 MG/ML IJ SUSP
40.0000 mg | INTRAMUSCULAR | Status: AC | PRN
Start: 2023-03-02 — End: 2023-03-02
  Administered 2023-03-02: 40 mg via INTRA_ARTICULAR

## 2023-03-02 MED ORDER — LIDOCAINE HCL 1 % IJ SOLN
1.0000 mL | INTRAMUSCULAR | Status: AC | PRN
Start: 2023-03-02 — End: 2023-03-02
  Administered 2023-03-02: 1 mL

## 2023-03-02 NOTE — Patient Instructions (Signed)
Standing Labs We placed an order today for your standing lab work.   Please have your standing labs drawn in January and every 3 months   Please have your labs drawn 2 weeks prior to your appointment so that the provider can discuss your lab results at your appointment, if possible.  Please note that you may see your imaging and lab results in MyChart before we have reviewed them. We will contact you once all results are reviewed. Please allow our office up to 72 hours to thoroughly review all of the results before contacting the office for clarification of your results.  WALK-IN LAB HOURS  Monday through Thursday from 8:00 am -12:30 pm and 1:00 pm-5:00 pm and Friday from 8:00 am-12:00 pm.  Patients with office visits requiring labs will be seen before walk-in labs.  You may encounter longer than normal wait times. Please allow additional time. Wait times may be shorter on  Monday and Thursday afternoons.  We do not book appointments for walk-in labs. We appreciate your patience and understanding with our staff.   Labs are drawn by [facility]. Please bring your co-pay at the time of your lab draw.  You may receive a bill from [facility] for your lab work.  Please note if you are on Hydroxychloroquine and and an order has been placed for a Hydroxychloroquine level,  you will need to have it drawn 4 hours or more after your last dose.  If you wish to have your labs drawn at another location, please call the office 24 hours in advance so we can fax the orders.  The office is located at [address], [address], [place], [place] [address]   If you have any questions regarding directions or hours of operation,  please call [phone].   As a reminder, please drink plenty of water prior to coming for your lab work. Thanks!  Shoulder Exercises Ask your health care provider which exercises are safe for you. Do exercises exactly as told by your health care provider and adjust them as directed. It is  normal to feel mild stretching, pulling, tightness, or discomfort as you do these exercises. Stop right away if you feel sudden pain or your pain gets worse. Do not begin these exercises until told by your health care provider. Stretching exercises External rotation and abduction This exercise is sometimes called corner stretch. The exercise rotates your arm outward (external rotation) and moves your arm out from your body (abduction). Stand in a doorway with one of your feet slightly in front of the other. This is called a staggered stance. If you cannot reach your forearms to the door frame, stand facing a corner of a room. Choose one of the following positions as told by your health care provider: Place your hands and forearms on the door frame above your head. Place your hands and forearms on the door frame at the height of your head. Place your hands on the door frame at the height of your elbows. Slowly move your weight onto your front foot until you feel a stretch across your chest and in the front of your shoulders. Keep your head and chest upright and keep your abdominal muscles tight. Hold for __________ seconds. To release the stretch, shift your weight to your back foot. Repeat __________ times. Complete this exercise __________ times a day. Extension, standing  Stand and hold a broomstick, a cane, or a similar object behind your back. Your hands should be a little wider than  shoulder-width apart. Your palms should face away from your back. Keeping your elbows straight and your shoulder muscles relaxed, move the stick away from your body until you feel a stretch in your shoulders (extension). Avoid shrugging your shoulders while you move the stick. Keep your shoulder blades tucked down toward the middle of your back. Hold for __________ seconds. Slowly return to the starting position. Repeat __________ times. Complete this exercise __________ times a day. Range-of-motion  exercises Pendulum  Stand near a wall or a surface that you can hold onto for balance. Bend at the waist and let your left / right arm hang straight down. Use your other arm to support you. Keep your back straight and do not lock your knees. Relax your left / right arm and shoulder muscles, and move your hips and your trunk so your left / right arm swings freely. Your arm should swing because of the motion of your body, not because you are using your arm or shoulder muscles. Keep moving your hips and trunk so your arm swings in the following directions, as told by your health care provider: Side to side. Forward and backward. In clockwise and counterclockwise circles. Continue each motion for __________ seconds, or for as long as told by your health care provider. Slowly return to the starting position. Repeat __________ times. Complete this exercise __________ times a day. Shoulder flexion, standing  Stand and hold a broomstick, a cane, or a similar object. Place your hands a little more than shoulder-width apart on the object. Your left / right hand should be palm-up, and your other hand should be palm-down. Keep your elbow straight and your shoulder muscles relaxed. Push the stick up with your healthy arm to raise your left / right arm in front of your body, and then over your head until you feel a stretch in your shoulder (flexion). Avoid shrugging your shoulder while you raise your arm. Keep your shoulder blade tucked down toward the middle of your back. Hold for __________ seconds. Slowly return to the starting position. Repeat __________ times. Complete this exercise __________ times a day. Shoulder abduction, standing  Stand and hold a broomstick, a cane, or a similar object. Place your hands a little more than shoulder-width apart on the object. Your left / right hand should be palm-up, and your other hand should be palm-down. Keep your elbow straight and your shoulder muscles  relaxed. Push the object across your body toward your left / right side. Raise your left / right arm to the side of your body (abduction) until you feel a stretch in your shoulder. Do not raise your arm above shoulder height unless your health care provider tells you to do that. If directed, raise your arm over your head. Avoid shrugging your shoulder while you raise your arm. Keep your shoulder blade tucked down toward the middle of your back. Hold for __________ seconds. Slowly return to the starting position. Repeat __________ times. Complete this exercise __________ times a day. Internal rotation  Place your left / right hand behind your back, palm-up. Use your other hand to dangle an exercise band, a broomstick, or a similar object over your shoulder. Grasp the band with your left / right hand so you are holding on to both ends. Gently pull up on the band until you feel a stretch in the front of your left / right shoulder. The movement of your arm toward the center of your body is called internal rotation. Avoid shrugging your  shoulder while you raise your arm. Keep your shoulder blade tucked down toward the middle of your back. Hold for __________ seconds. Release the stretch by letting go of the band and lowering your hands. Repeat __________ times. Complete this exercise __________ times a day. Strengthening exercises External rotation  Sit in a stable chair without armrests. Secure an exercise band to a stable object at elbow height on your left / right side. Place a soft object, such as a folded towel or a small pillow, between your left / right upper arm and your body to move your elbow about 4 inches (10 cm) away from your side. Hold the end of the exercise band so it is tight and there is no slack. Keeping your elbow pressed against the soft object, slowly move your forearm out, away from your abdomen (external rotation). Keep your body steady so only your forearm moves. Hold for  __________ seconds. Slowly return to the starting position. Repeat __________ times. Complete this exercise __________ times a day. Shoulder abduction  Sit in a stable chair without armrests, or stand up. Hold a __________ lb / kg weight in your left / right hand, or hold an exercise band with both hands. Start with your arms straight down and your left / right palm facing in, toward your body. Slowly lift your left / right hand out to your side (abduction). Do not lift your hand above shoulder height unless your health care provider tells you that this is safe. Keep your arms straight. Avoid shrugging your shoulder while you do this movement. Keep your shoulder blade tucked down toward the middle of your back. Hold for __________ seconds. Slowly lower your arm, and return to the starting position. Repeat __________ times. Complete this exercise __________ times a day. Shoulder extension  Sit in a stable chair without armrests, or stand up. Secure an exercise band to a stable object in front of you so it is at shoulder height. Hold one end of the exercise band in each hand. Straighten your elbows and lift your hands up to shoulder height. Squeeze your shoulder blades together as you pull your hands down to the sides of your thighs (extension). Stop when your hands are straight down by your sides. Do not let your hands go behind your body. Hold for __________ seconds. Slowly return to the starting position. Repeat __________ times. Complete this exercise __________ times a day. Shoulder row  Sit in a stable chair without armrests, or stand up. Secure an exercise band to a stable object in front of you so it is at chest height. Hold one end of the exercise band in each hand. Position your palms so that your thumbs are facing the ceiling (neutral position). Bend each of your elbows to a 90-degree angle (right angle) and keep your upper arms at your sides. Step back or move the chair back  until the band is tight and there is no slack. Slowly pull your elbows back behind you. Hold for __________ seconds. Slowly return to the starting position. Repeat __________ times. Complete this exercise __________ times a day. Shoulder press-ups  Sit in a stable chair that has armrests. Sit upright, with your feet flat on the floor. Put your hands on the armrests so your elbows are bent and your fingers are pointing forward. Your hands should be about even with the sides of your body. Push down on the armrests and use your arms to lift yourself off the chair. Straighten your elbows  and lift yourself up as much as you comfortably can. Move your shoulder blades down, and avoid letting your shoulders move up toward your ears. Keep your feet on the ground. As you get stronger, your feet should support less of your body weight as you lift yourself up. Hold for __________ seconds. Slowly lower yourself back into the chair. Repeat __________ times. Complete this exercise __________ times a day. Wall push-ups  Stand so you are facing a stable wall. Your feet should be about one arm-length away from the wall. Lean forward and place your palms on the wall at shoulder height. Keep your feet flat on the floor as you bend your elbows and lean forward toward the wall. Hold for __________ seconds. Straighten your elbows to push yourself back to the starting position. Repeat __________ times. Complete this exercise __________ times a day. This information is not intended to replace advice given to you by your health care provider. Make sure you discuss any questions you have with your health care provider. Document Revised: 07/01/2021 Document Reviewed: 07/01/2021 Elsevier Patient Education  2024 [employer].

## 2023-03-02 NOTE — Telephone Encounter (Signed)
Please review and sign no print humira rx to reflect frequency change due to insurance issues. Thanks!

## 2023-03-03 LAB — COMPLETE METABOLIC PANEL WITH GFR
AG Ratio: 1.6 (calc) (ref 1.0–2.5)
ALT: 59 U/L — ABNORMAL HIGH (ref 9–46)
AST: 37 U/L — ABNORMAL HIGH (ref 10–35)
Albumin: 4.4 g/dL (ref 3.6–5.1)
Alkaline phosphatase (APISO): 57 U/L (ref 35–144)
BUN: 21 mg/dL (ref 7–25)
CO2: 27 mmol/L (ref 20–32)
Calcium: 9.7 mg/dL (ref 8.6–10.3)
Chloride: 100 mmol/L (ref 98–110)
Creat: 0.94 mg/dL (ref 0.70–1.35)
Globulin: 2.8 g/dL (ref 1.9–3.7)
Glucose, Bld: 137 mg/dL — ABNORMAL HIGH (ref 65–99)
Potassium: 4.2 mmol/L (ref 3.5–5.3)
Sodium: 136 mmol/L (ref 135–146)
Total Bilirubin: 0.9 mg/dL (ref 0.2–1.2)
Total Protein: 7.2 g/dL (ref 6.1–8.1)
eGFR: 88 mL/min/{1.73_m2} (ref >=60)

## 2023-03-03 LAB — CBC WITH DIFFERENTIAL/PLATELET
Absolute Monocytes: 378 {cells}/uL (ref 200–950)
Basophils Absolute: 51 {cells}/uL (ref 0–200)
Basophils Relative: 0.8 %
Eosinophils Absolute: 77 {cells}/uL (ref 15–500)
Eosinophils Relative: 1.2 %
HCT: 49.3 % (ref 38.5–50.0)
Hemoglobin: 16.2 g/dL (ref 13.2–17.1)
Lymphs Abs: 2234 {cells}/uL (ref 850–3900)
MCH: 30.7 pg (ref 27.0–33.0)
MCHC: 32.9 g/dL (ref 32.0–36.0)
MCV: 93.5 fL (ref 80.0–100.0)
MPV: 9.9 fL (ref 7.5–12.5)
Monocytes Relative: 5.9 %
Neutro Abs: 3661 {cells}/uL (ref 1500–7800)
Neutrophils Relative %: 57.2 %
Platelets: 269 10*3/uL (ref 140–400)
RBC: 5.27 10*6/uL (ref 4.20–5.80)
RDW: 14.4 % (ref 11.0–15.0)
Total Lymphocyte: 34.9 %
WBC: 6.4 10*3/uL (ref 3.8–10.8)

## 2023-03-03 NOTE — Progress Notes (Signed)
Glucose is 137. AST and ALT are elevated-please advise patient to avoid tylenol and alcohol use.  Rest of CMP WNL  CBC WNL

## 2023-03-11 DIAGNOSIS — H04123 Dry eye syndrome of bilateral lacrimal glands: Secondary | ICD-10-CM | POA: Diagnosis not present

## 2023-03-11 DIAGNOSIS — H40033 Anatomical narrow angle, bilateral: Secondary | ICD-10-CM | POA: Diagnosis not present

## 2023-03-16 DIAGNOSIS — E039 Hypothyroidism, unspecified: Secondary | ICD-10-CM | POA: Diagnosis not present

## 2023-03-16 DIAGNOSIS — Z299 Encounter for prophylactic measures, unspecified: Secondary | ICD-10-CM | POA: Diagnosis not present

## 2023-03-16 DIAGNOSIS — Z0000 Encounter for general adult medical examination without abnormal findings: Secondary | ICD-10-CM | POA: Diagnosis not present

## 2023-03-16 DIAGNOSIS — F339 Major depressive disorder, recurrent, unspecified: Secondary | ICD-10-CM | POA: Diagnosis not present

## 2023-03-16 DIAGNOSIS — I10 Essential (primary) hypertension: Secondary | ICD-10-CM | POA: Diagnosis not present

## 2023-03-16 DIAGNOSIS — I25119 Atherosclerotic heart disease of native coronary artery with unspecified angina pectoris: Secondary | ICD-10-CM | POA: Diagnosis not present

## 2023-03-19 ENCOUNTER — Other Ambulatory Visit: Payer: Self-pay | Admitting: Physician Assistant

## 2023-03-19 DIAGNOSIS — L4050 Arthropathic psoriasis, unspecified: Secondary | ICD-10-CM

## 2023-03-19 NOTE — Telephone Encounter (Signed)
Last Fill: 11/17/2022  Labs: 03/02/2023 Glucose is 137. AST and ALT are elevated- Rest of CMP WNL CBC WNL  TB Gold: 09/04/2022 Neg    Next Visit: 08/20/2023  Last Visit: 03/02/2023  [id number] arthropathy   Current Dose per office note 03/02/2023: Humira 40 mg sq injections every 21 days   Dose frequency change due to insurance issues.    Okay to refill Humira?

## 2023-04-19 ENCOUNTER — Other Ambulatory Visit: Payer: Self-pay | Admitting: *Deleted

## 2023-04-19 MED ORDER — PREDNISONE 5 MG PO TABS: ORAL_TABLET | ORAL | 0 refills | Status: DC

## 2023-04-19 NOTE — Telephone Encounter (Signed)
Please clarify if he is still spacing humira to injecting every 21 days?  I would recommend increasing the frequency of dosing to every 14 days if he continues to have recurrent flares.  Ok to send in prednisone 20 mg tapering by 5 mg every 2 days. Take prednisone in the morning with food and avoid the use of NSAIDs.

## 2023-04-19 NOTE — Telephone Encounter (Signed)
Patient contacted the office stating he has been having pain and swelling in his left and right hands. Patient states the left is worse than the right. Patient states he is having soreness at his thumb that goes into his wrist. Patient states he is also having discomfort in his lower back and feeling fatigued. Patient states this started when the cold snap came through. Patient states he was given an injection in his shoulder at his last appointment and prednisone was also discussed but he declined at that time. Patient states the injection did help some but the shoulder is not completely better. Patient is requesting a prescription for Prednisone to be sent to [facility].

## 2023-04-19 NOTE — Addendum Note (Signed)
Addended by: Henriette Combs on: 04/19/2023 12:03 PM   Modules accepted: Orders

## 2023-04-19 NOTE — Telephone Encounter (Signed)
Patient states he is still spacing his Humira every 21 days with a dose due next week.  Patient advised Ladona Ridgel would recommend increasing the frequency of dosing to every 14 days if he continues to have recurrent flares.  Ok to send in prednisone 20 mg tapering by 5 mg every 2 days. Take prednisone in the morning with food and avoid the use of NSAIDs  Patient expressed understanding and will call if he changes the dosing to every 14 days.

## 2023-04-30 DIAGNOSIS — R350 Frequency of micturition: Secondary | ICD-10-CM | POA: Diagnosis not present

## 2023-04-30 DIAGNOSIS — R948 Abnormal results of function studies of other organs and systems: Secondary | ICD-10-CM | POA: Diagnosis not present

## 2023-04-30 DIAGNOSIS — E291 Testicular hypofunction: Secondary | ICD-10-CM | POA: Diagnosis not present

## 2023-04-30 DIAGNOSIS — N529 Male erectile dysfunction, unspecified: Secondary | ICD-10-CM | POA: Diagnosis not present

## 2023-05-22 ENCOUNTER — Other Ambulatory Visit: Payer: Self-pay | Admitting: Physician Assistant

## 2023-05-24 NOTE — Telephone Encounter (Signed)
Last Fill: 10/12/2022  Labs: 03/02/2023 Glucose is 137. AST and ALT are elevated  Rest of CMP WNL CBC WNL  Next Visit: 08/20/2023  Last Visit: 03/02/2023  DX: Psoriatic arthropathy   Current Dose per office note 03/02/2023: MTX 0.8 ml sq once weekly   Okay to refill Methotrexate?

## 2023-05-25 DIAGNOSIS — Z299 Encounter for prophylactic measures, unspecified: Secondary | ICD-10-CM | POA: Diagnosis not present

## 2023-05-25 DIAGNOSIS — J320 Chronic maxillary sinusitis: Secondary | ICD-10-CM | POA: Diagnosis not present

## 2023-05-25 DIAGNOSIS — I2510 Atherosclerotic heart disease of native coronary artery without angina pectoris: Secondary | ICD-10-CM | POA: Diagnosis not present

## 2023-06-08 DIAGNOSIS — Z299 Encounter for prophylactic measures, unspecified: Secondary | ICD-10-CM | POA: Diagnosis not present

## 2023-06-08 DIAGNOSIS — I152 Hypertension secondary to endocrine disorders: Secondary | ICD-10-CM | POA: Diagnosis not present

## 2023-06-08 DIAGNOSIS — I25119 Atherosclerotic heart disease of native coronary artery with unspecified angina pectoris: Secondary | ICD-10-CM | POA: Diagnosis not present

## 2023-06-08 DIAGNOSIS — L4050 Arthropathic psoriasis, unspecified: Secondary | ICD-10-CM | POA: Diagnosis not present

## 2023-06-08 DIAGNOSIS — E1159 Type 2 diabetes mellitus with other circulatory complications: Secondary | ICD-10-CM | POA: Diagnosis not present

## 2023-06-08 DIAGNOSIS — F339 Major depressive disorder, recurrent, unspecified: Secondary | ICD-10-CM | POA: Diagnosis not present

## 2023-07-01 ENCOUNTER — Other Ambulatory Visit: Payer: Self-pay | Admitting: *Deleted

## 2023-07-01 ENCOUNTER — Other Ambulatory Visit: Payer: Self-pay | Admitting: Physician Assistant

## 2023-07-01 DIAGNOSIS — L4050 Arthropathic psoriasis, unspecified: Secondary | ICD-10-CM

## 2023-07-01 DIAGNOSIS — Z79899 Other long term (current) drug therapy: Secondary | ICD-10-CM

## 2023-07-01 DIAGNOSIS — Z8639 Personal history of other endocrine, nutritional and metabolic disease: Secondary | ICD-10-CM

## 2023-07-01 NOTE — Telephone Encounter (Signed)
 Last Fill: 03/19/2023   Labs: 03/02/2023 Glucose is 137. AST and ALT are elevated- Rest of CMP WNL CBC WNL   TB Gold: 09/04/2022 Neg     Next Visit: 08/20/2023   Last Visit: 03/02/2023   IK:[id number] arthropathy    Current Dose per office note 03/02/2023: Humira  40 mg sq injections every 21 days    Dose frequency change due to insurance issues.    Patient advised he is due to update labs. Patient states he will update his labs tomorrow.    Okay to refill Humira ?

## 2023-07-02 DIAGNOSIS — Z79899 Other long term (current) drug therapy: Secondary | ICD-10-CM | POA: Diagnosis not present

## 2023-07-02 DIAGNOSIS — Z8639 Personal history of other endocrine, nutritional and metabolic disease: Secondary | ICD-10-CM | POA: Diagnosis not present

## 2023-07-03 LAB — COMPLETE METABOLIC PANEL WITH GFR
AG Ratio: 1.4 (calc) (ref 1.0–2.5)
ALT: 52 U/L — ABNORMAL HIGH (ref 9–46)
AST: 30 U/L (ref 10–35)
Albumin: 4.1 g/dL (ref 3.6–5.1)
Alkaline phosphatase (APISO): 51 U/L (ref 35–144)
BUN: 15 mg/dL (ref 7–25)
CO2: 27 mmol/L (ref 20–32)
Calcium: 9.5 mg/dL (ref 8.6–10.3)
Chloride: 100 mmol/L (ref 98–110)
Creat: 0.89 mg/dL (ref 0.70–1.35)
Globulin: 3 g/dL (ref 1.9–3.7)
Glucose, Bld: 144 mg/dL — ABNORMAL HIGH (ref 65–139)
Potassium: 4.2 mmol/L (ref 3.5–5.3)
Sodium: 136 mmol/L (ref 135–146)
Total Bilirubin: 0.8 mg/dL (ref 0.2–1.2)
Total Protein: 7.1 g/dL (ref 6.1–8.1)
eGFR: 93 mL/min/{1.73_m2} (ref >=60)

## 2023-07-03 LAB — CBC WITH DIFFERENTIAL/PLATELET
Absolute Lymphocytes: 3008 {cells}/uL (ref 850–3900)
Absolute Monocytes: 563 {cells}/uL (ref 200–950)
Basophils Absolute: 51 {cells}/uL (ref 0–200)
Basophils Relative: 0.8 %
Eosinophils Absolute: 109 {cells}/uL (ref 15–500)
Eosinophils Relative: 1.7 %
HCT: 47.5 % (ref 38.5–50.0)
Hemoglobin: 15.7 g/dL (ref 13.2–17.1)
MCH: 30.7 pg (ref 27.0–33.0)
MCHC: 33.1 g/dL (ref 32.0–36.0)
MCV: 92.8 fL (ref 80.0–100.0)
MPV: 9.7 fL (ref 7.5–12.5)
Monocytes Relative: 8.8 %
Neutro Abs: 2669 {cells}/uL (ref 1500–7800)
Neutrophils Relative %: 41.7 %
Platelets: 273 10*3/uL (ref 140–400)
RBC: 5.12 10*6/uL (ref 4.20–5.80)
RDW: 14.4 % (ref 11.0–15.0)
Total Lymphocyte: 47 %
WBC: 6.4 10*3/uL (ref 3.8–10.8)

## 2023-07-03 LAB — HEMOGLOBIN A1C
Hgb A1c MFr Bld: 7.3 %{Hb} — ABNORMAL HIGH (ref <5.7)
Mean Plasma Glucose: 163 mg/dL
eAG (mmol/L): 9 mmol/L

## 2023-07-03 NOTE — Progress Notes (Signed)
 Hemoglobin A1c is elevated at 7.3.Glucose is elevated.  Liver function is mildly elevated and stable.

## 2023-07-05 NOTE — Progress Notes (Signed)
 CBC WNL

## 2023-07-13 DIAGNOSIS — D485 Neoplasm of uncertain behavior of skin: Secondary | ICD-10-CM | POA: Diagnosis not present

## 2023-07-13 DIAGNOSIS — L570 Actinic keratosis: Secondary | ICD-10-CM | POA: Diagnosis not present

## 2023-07-13 DIAGNOSIS — D225 Melanocytic nevi of trunk: Secondary | ICD-10-CM | POA: Diagnosis not present

## 2023-07-13 DIAGNOSIS — Z129 Encounter for screening for malignant neoplasm, site unspecified: Secondary | ICD-10-CM | POA: Diagnosis not present

## 2023-07-13 DIAGNOSIS — L821 Other seborrheic keratosis: Secondary | ICD-10-CM | POA: Diagnosis not present

## 2023-07-13 DIAGNOSIS — C4441 Basal cell carcinoma of skin of scalp and neck: Secondary | ICD-10-CM | POA: Diagnosis not present

## 2023-07-28 DIAGNOSIS — C4441 Basal cell carcinoma of skin of scalp and neck: Secondary | ICD-10-CM | POA: Diagnosis not present

## 2023-08-03 DIAGNOSIS — E291 Testicular hypofunction: Secondary | ICD-10-CM | POA: Diagnosis not present

## 2023-08-05 DIAGNOSIS — M2550 Pain in unspecified joint: Secondary | ICD-10-CM | POA: Diagnosis not present

## 2023-08-06 NOTE — Progress Notes (Signed)
 Office Visit Note  Patient: Brandon Frazier             Date of Birth: December 04, 1955           MRN: [id number]             PCP: Ignatius Specking, MD Referring: Ignatius Specking, MD Visit Date: 08/20/2023 Occupation: @GUAROCC @  Subjective:  Increased pain in joints   History of Present Illness: Brandon Frazier is a 68 y.o. male with psoriatic arthritis, psoriasis and osteoarthritis.  Patient states for the last 2 months he has been having increased pain and discomfort in his joints.  He increased Humira back to 40 mg subcu every 14 days along with methotrexate 0.8 mL subcu weekly and folic acid 2 mg p.o. daily.  He continues to have some discomfort in his hands.  He states last week he started having pain in his other joints including his hands, hips, lower back, knees.  He states he started taking it easy and the pain has improved but he still continues to have some discomfort.  He states while he was driving to the office he was having pain in his left SI joint.  Family has been also having some discomfort in his left elbow.  He noticed some swelling in his hands.  Has not had any psoriasis flare.    Activities of Daily Living:  Patient reports morning stiffness for 1-2 hours.   Patient Denies nocturnal pain.  Difficulty dressing/grooming: Denies Difficulty climbing stairs: Denies Difficulty getting out of chair: Denies Difficulty using hands for taps, buttons, cutlery, and/or writing: Denies  Review of Systems  Constitutional:  Positive for fatigue.  HENT:  Negative for mouth sores and mouth dryness.   Eyes:  Positive for dryness. Negative for pain.  Respiratory:  Negative for shortness of breath.   Cardiovascular:  Negative for chest pain and palpitations.  Gastrointestinal:  Negative for blood in stool, constipation and diarrhea.  Endocrine: Negative for increased urination.  Genitourinary:  Negative for involuntary urination.  Musculoskeletal:  Positive for joint pain, joint pain, joint  swelling, myalgias, muscle weakness, morning stiffness, muscle tenderness and myalgias. Negative for gait problem.  Skin:  Negative for color change, rash and sensitivity to sunlight.  Allergic/Immunologic: Negative for susceptible to infections.  Neurological:  Negative for dizziness and headaches.  Hematological:  Negative for swollen glands.  Psychiatric/Behavioral:  Negative for depressed mood and sleep disturbance. The patient is not nervous/anxious.     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   Psoriasis 12/10/2010   Knee pain, bilateral 10/30/2010   Hip pain, bilateral 10/30/2010   Leg length discrepancy 10/30/2010    Past Medical History:  Diagnosis Date   Anterior myocardial infarction (HCC) 1995   Anxiety    CAD (coronary artery disease)    Angioplasty of LAD 1995   Essential hypertension    GERD (gastroesophageal reflux disease)    Psoriatic arthritis (HCC)    Type 2 diabetes mellitus (HCC)     Family History  Problem Relation Age of Onset  GER disease Mother    Heart disease Mother    Liver cancer Father    Arthritis Son    Psoriasis Son    Psoriasis Daughter    Past Surgical History:  Procedure Laterality Date   CARDIAC CATHETERIZATION     1995, 2001   COLONOSCOPY     COLONOSCOPY  07/04/2020   EYE SURGERY Bilateral    lasik   INSERTION OF MESH N/A 04/23/2016   Procedure: INSERTION OF MESH;  Surgeon: Ovidio Kin, MD;  Location: [facility] OR;  Service: General;  Laterality: N/A;   KNEE ARTHROPLASTY     left knee arthroscopy      1990, 1997   MEDIAL PARTIAL KNEE REPLACEMENT Right 05/2019   PARTIAL KNEE ARTHROPLASTY Left 02/16/2014   Procedure: LEFT KNEE UNI ARTHROPLASTY;  Surgeon: Eulas Post, MD;  Location: [facility];  Service: Orthopedics;  Laterality: Left;   SKIN BIOPSY  06/2023   UMBILICAL HERNIA REPAIR N/A 04/23/2016   Procedure: LAPAROSCOPIC UMBILICAL HERNIA;  Surgeon: Ovidio Kin, MD;  Location: [facility] OR;  Service: General;  Laterality: N/A;   Social History   Social History Narrative   Not on file   Immunization History  Administered Date(s) Administered   Influenza,inj,Quad PF,6+ Mos 03/19/2017, 03/24/2018   Influenza-Unspecified 03/18/2022   Rsv, Bivalent, Protein Subunit Rsvpref,pf Verdis Frederickson) 03/18/2022   Tdap 05/14/2018   Zoster Recombinant(Shingrix) 10/27/2021     Objective: Vital Signs: BP 135/76 (BP Location: Left Arm, Patient Position: Sitting, Cuff Size: Normal)   Pulse 77   Resp 16   Ht 5\' 8"  (1.727 m)   Wt 194 lb (88 kg)   BMI 29.50 kg/m    Physical Exam Vitals and nursing note reviewed.  Constitutional:      Appearance: He is well-developed.  HENT:     Head: Normocephalic and atraumatic.  Eyes:     Conjunctiva/sclera: Conjunctivae normal.     Pupils: Pupils are equal, round, and reactive to light.  Cardiovascular:     Rate and Rhythm: Normal rate and regular rhythm.     Heart sounds: Normal heart sounds.  Pulmonary:     Effort: Pulmonary effort is normal.     Breath sounds: Normal breath sounds.  Abdominal:     General: Bowel sounds are normal.     Palpations: Abdomen is soft.  Musculoskeletal:     Cervical back: Normal range of motion and neck supple.  Skin:    General: Skin is warm and dry.     Capillary Refill: Capillary refill takes less than 2 seconds.  Neurological:     Mental Status: He is alert and oriented to person, place, and time.  Psychiatric:        Behavior: Behavior normal.      Musculoskeletal Exam: Cervical spine was in  good range of motion.  He had no tenderness over thoracic or lumbar spine.  There was no SI joint tenderness.  Shoulders, elbows, wrist, MCPs PIPs and DIPs in good range of motion.  He had tenderness over bilateral CMC joints but no synovitis was noted.  Hip joints were in good range of motion.  Knee joints were replaced and were in good range of motion.  He had some discomfort range of motion of his left knee joint.  No warmth swelling or effusion was noted.  There was no tenderness over ankles or MTPs.  CDAI Exam: CDAI Score: -- Patient Global: --; Provider Global: -- Swollen: --; Tender: -- Joint Exam 08/20/2023  No joint exam has been documented for this visit   There is currently no information documented on the homunculus. Go to the Rheumatology activity and complete the homunculus joint exam.  Investigation: No additional findings.  Imaging: No results found.  Recent Labs: Lab Results  Component Value Date   WBC 6.4 07/02/2023   HGB 15.7 07/02/2023   PLT 273 07/02/2023   NA 136 07/02/2023   K 4.2 07/02/2023   CL 100 07/02/2023   CO2 27 07/02/2023   GLUCOSE 144 (H) 07/02/2023   BUN 15 07/02/2023   CREATININE 0.89 07/02/2023   BILITOT 0.8 07/02/2023   AST 30 07/02/2023   ALT 52 (H) 07/02/2023   PROT 7.1 07/02/2023   CALCIUM 9.5 07/02/2023   GFRAA 104 11/07/2020   QFTBGOLDPLUS NEGATIVE 09/04/2022    Speciality Comments: No specialty comments available.  Procedures:  No procedures performed Allergies: Aspirin and Codeine   Assessment / Plan:     Visit Diagnoses: Psoriatic arthropathy (HCC) -patient complains of increased pain and discomfort in multiple joints.  She states the pain has been worse over the last few months.  Increased Humira to every 14 days along with methotrexate 0.8 mL subcu weekly for the last 1-1/2 months.  He complains of discomfort in his bilateral hands which she describes over the [facility] joints.  He states that his hands get stiff towards the end  of the day.  He has been also having discomfort in his lower back and his hips and his knees.  He states he has been very active in his yard.  Over the last week he decided not to be as an active and he has noticed improvement in the discomfort.  No synovitis was noted on the examination.  Patient requested a prednisone taper.  He states it always helps him.  Per patient's request after informed consent was obtained and side effects were discussed prednisone taper was given starting at 20 mg and taper by 5 mg every 2 days.  I will check sed rate today.  Plan: Sedimentation rate, predniSONE (DELTASONE) 5 MG tablet,   Psoriasis-he had no active psoriasis patches.  High risk medication use - Humira 40 mg sq injections every 14 days, MTX 0.8 ml sq once weekly, and folic acid 2 mg daily. -July 02, 2023 CBC was normal.  CMP showed elevated ALT of 52.  Will check labs in May and then every 3 months.  Dietary modifications were discussed.  Avoiding NSAIDs was discussed.  Plan: CBC with Differential/Platelet, Comprehensive metabolic panel with GFR, QuantiFERON-TB Gold Plus, information on immunization was placed in the AVS.  He was advised to hold Humira if he develops an infection and resume after the infection resolves.  Primary osteoarthritis of both hands-he complains of discomfort in his hands.  He has bilateral CMC PIP and DIP thickening.  He has been having discomfort over CMC joints.  We discussed possible CMC joint injection if his symptoms persist.  Status post left partial knee replacement-he had good range of motion without discomfort.  Although he complains of intermittent discomfort in his left knee joint.  No warmth swelling or effusion was noted.  Status post right partial knee replacement-right knee does in good range of motion.  History of diabetes mellitus-he was advised to monitor blood glucose closely while he is on prednisone.  Side effects of prednisone including weight gain,  hypertension, elevated blood glucose, cataracts, heart disease and osteoporosis were discussed at length.  History of hyperlipidemia-followed by his  PCP.  He is on Pravachol 40 mg p.o. daily.  History of hypertension-blood pressure was normal at 135/76.  History of coronary artery disease  Vitamin D deficiency-he takes vitamin D supplement.  Orders: Orders Placed This Encounter  Procedures   CBC with Differential/Platelet   Comprehensive metabolic panel with GFR   Sedimentation rate   QuantiFERON-TB Gold Plus   Meds ordered this encounter  Medications   predniSONE (DELTASONE) 5 MG tablet    Sig: Take 4 tabs po qd x 2 days, 3  tabs po qd x 2 days, 2  tabs po qd x 2 days, 1  tab po qd x 2 days    Dispense:  20 tablet    Refill:  0   .  Follow-Up Instructions: Return in about 3 months (around 11/20/2023) for Psoriatic arthritis.   Pollyann Savoy, MD  Note - This record has been created using Animal nutritionist.  Chart creation errors have been sought, but may not always  have been located. Such creation errors do not reflect on  the standard of medical care.

## 2023-08-10 DIAGNOSIS — Z299 Encounter for prophylactic measures, unspecified: Secondary | ICD-10-CM | POA: Diagnosis not present

## 2023-08-10 DIAGNOSIS — D849 Immunodeficiency, unspecified: Secondary | ICD-10-CM | POA: Diagnosis not present

## 2023-08-10 DIAGNOSIS — I10 Essential (primary) hypertension: Secondary | ICD-10-CM | POA: Diagnosis not present

## 2023-08-10 DIAGNOSIS — I25119 Atherosclerotic heart disease of native coronary artery with unspecified angina pectoris: Secondary | ICD-10-CM | POA: Diagnosis not present

## 2023-08-10 DIAGNOSIS — L4050 Arthropathic psoriasis, unspecified: Secondary | ICD-10-CM | POA: Diagnosis not present

## 2023-08-20 ENCOUNTER — Encounter: Payer: Self-pay | Admitting: Rheumatology

## 2023-08-20 ENCOUNTER — Ambulatory Visit: Payer: Medicare PPO | Attending: Rheumatology | Admitting: Rheumatology

## 2023-08-20 VITALS — BP 135/76 | HR 77 | Resp 16 | Ht 68.0 in | Wt 194.0 lb

## 2023-08-20 DIAGNOSIS — E559 Vitamin D deficiency, unspecified: Secondary | ICD-10-CM

## 2023-08-20 DIAGNOSIS — M19041 Primary osteoarthritis, right hand: Secondary | ICD-10-CM | POA: Diagnosis not present

## 2023-08-20 DIAGNOSIS — Z79899 Other long term (current) drug therapy: Secondary | ICD-10-CM

## 2023-08-20 DIAGNOSIS — Z8679 Personal history of other diseases of the circulatory system: Secondary | ICD-10-CM | POA: Diagnosis not present

## 2023-08-20 DIAGNOSIS — Z96651 Presence of right artificial knee joint: Secondary | ICD-10-CM

## 2023-08-20 DIAGNOSIS — L409 Psoriasis, unspecified: Secondary | ICD-10-CM | POA: Diagnosis not present

## 2023-08-20 DIAGNOSIS — M25511 Pain in right shoulder: Secondary | ICD-10-CM

## 2023-08-20 DIAGNOSIS — L4050 Arthropathic psoriasis, unspecified: Secondary | ICD-10-CM

## 2023-08-20 DIAGNOSIS — Z8639 Personal history of other endocrine, nutritional and metabolic disease: Secondary | ICD-10-CM | POA: Diagnosis not present

## 2023-08-20 DIAGNOSIS — M19042 Primary osteoarthritis, left hand: Secondary | ICD-10-CM

## 2023-08-20 DIAGNOSIS — Z96652 Presence of left artificial knee joint: Secondary | ICD-10-CM | POA: Diagnosis not present

## 2023-08-20 MED ORDER — PREDNISONE 5 MG PO TABS: ORAL_TABLET | ORAL | 0 refills | Status: AC

## 2023-08-20 NOTE — Progress Notes (Signed)
 Sed rate normal.

## 2023-08-20 NOTE — Patient Instructions (Signed)
Standing Labs We placed an order today for your standing lab work.   Please have your standing labs drawn in May and every 3 months   Please have your labs drawn 2 weeks prior to your appointment so that the provider can discuss your lab results at your appointment, if possible.  Please note that you may see your imaging and lab results in MyChart before we have reviewed them. We will contact you once all results are reviewed. Please allow our office up to 72 hours to thoroughly review all of the results before contacting the office for clarification of your results.  WALK-IN LAB HOURS  Monday through Thursday from 8:00 am -12:30 pm and 1:00 pm-5:00 pm and Friday from 8:00 am-12:00 pm.  Patients with office visits requiring labs will be seen before walk-in labs.  You may encounter longer than normal wait times. Please allow additional time. Wait times may be shorter on  Monday and Thursday afternoons.  We do not book appointments for walk-in labs. We appreciate your patience and understanding with our staff.   Labs are drawn by [facility]. Please bring your co-pay at the time of your lab draw.  You may receive a bill from [facility] for your lab work.  Please note if you are on Hydroxychloroquine and and an order has been placed for a Hydroxychloroquine level,  you will need to have it drawn 4 hours or more after your last dose.  If you wish to have your labs drawn at another location, please call the office 24 hours in advance so we can fax the orders.  The office is located at [address], [address], [place], [place] [address]   If you have any questions regarding directions or hours of operation,  please call [phone].   As a reminder, please drink plenty of water prior to coming for your lab work. Thanks!  Vaccines You are taking a medication(s) that can suppress your immune system.  The following immunizations are recommended: Flu annually Covid-19  Td/Tdap (tetanus, diphtheria,  pertussis) every 10 years Pneumonia (Prevnar 15 then Pneumovax 23 at least 1 year apart.  Alternatively, can take Prevnar 20 without needing additional dose) Shingrix: 2 doses from 4 weeks to 6 months apart  Please check with your PCP to make sure you are up to date.  If you have signs or symptoms of an infection or start antibiotics: First, call your PCP for workup of your infection. Hold your medication through the infection, until you complete your antibiotics, and until symptoms resolve if you take the following: Injectable medication (Actemra, Benlysta, Cimzia, Cosentyx, Enbrel, Humira, Kevzara, Orencia, Remicade, Simponi, Stelara, Taltz, Tremfya) Methotrexate Leflunomide (Arava) Mycophenolate (Cellcept) Xeljanz, Olumiant, or Rinvoq   

## 2023-08-24 LAB — SEDIMENTATION RATE: Sed Rate: 2 mm/h (ref 0–20)

## 2023-08-24 LAB — QUANTIFERON-TB GOLD PLUS
Mitogen-NIL: 8.62 [IU]/mL
NIL: 0.05 [IU]/mL
QuantiFERON-TB Gold Plus: NEGATIVE
TB1-NIL: 0 [IU]/mL
TB2-NIL: 0 [IU]/mL

## 2023-08-24 NOTE — Progress Notes (Signed)
TB Gold negative

## 2023-09-09 DIAGNOSIS — E1159 Type 2 diabetes mellitus with other circulatory complications: Secondary | ICD-10-CM | POA: Diagnosis not present

## 2023-09-09 DIAGNOSIS — Z299 Encounter for prophylactic measures, unspecified: Secondary | ICD-10-CM | POA: Diagnosis not present

## 2023-09-09 DIAGNOSIS — I152 Hypertension secondary to endocrine disorders: Secondary | ICD-10-CM | POA: Diagnosis not present

## 2023-09-09 DIAGNOSIS — I10 Essential (primary) hypertension: Secondary | ICD-10-CM | POA: Diagnosis not present

## 2023-09-09 DIAGNOSIS — Z6830 Body mass index (BMI) 30.0-30.9, adult: Secondary | ICD-10-CM | POA: Diagnosis not present

## 2023-09-17 DIAGNOSIS — E291 Testicular hypofunction: Secondary | ICD-10-CM | POA: Diagnosis not present

## 2023-10-11 ENCOUNTER — Other Ambulatory Visit: Payer: Self-pay

## 2023-10-11 DIAGNOSIS — L4050 Arthropathic psoriasis, unspecified: Secondary | ICD-10-CM

## 2023-10-11 MED ORDER — HUMIRA (2 PEN) 40 MG/0.4ML ~~LOC~~ AJKT
40.0000 mg | AUTO-INJECTOR | SUBCUTANEOUS | 2 refills | Status: DC
Start: 2023-10-11 — End: 2024-01-25

## 2023-10-11 NOTE — Telephone Encounter (Signed)
 Last Fill: 07/01/2023  Labs: 07/02/2023 CBC WNL   TB Gold: 08/20/2023 TB Gold negative.   Next Visit: 11/25/2023  Last Visit: 08/20/2023  OZ:HYQMVHQIO arthropathy   Current Dose per office note 08/20/2023:  Humira  40 mg sq injections every 14 days   Patient advised that labs need to be updated, stated he would go to [facility] this week  Okay to refill Humira ?

## 2023-10-11 NOTE — Telephone Encounter (Signed)
Unable to sign prescription.

## 2023-10-12 ENCOUNTER — Other Ambulatory Visit: Payer: Self-pay | Admitting: *Deleted

## 2023-10-12 DIAGNOSIS — Z79899 Other long term (current) drug therapy: Secondary | ICD-10-CM

## 2023-10-12 LAB — CBC WITH DIFFERENTIAL/PLATELET
Absolute Lymphocytes: 2756 {cells}/uL (ref 850–3900)
Absolute Monocytes: 462 {cells}/uL (ref 200–950)
Basophils Absolute: 39 {cells}/uL (ref 0–200)
Basophils Relative: 0.7 %
Eosinophils Absolute: 72 {cells}/uL (ref 15–500)
Eosinophils Relative: 1.3 %
HCT: 44.8 % (ref 38.5–50.0)
Hemoglobin: 14.8 g/dL (ref 13.2–17.1)
MCH: 30.8 pg (ref 27.0–33.0)
MCHC: 33 g/dL (ref 32.0–36.0)
MCV: 93.3 fL (ref 80.0–100.0)
MPV: 9.8 fL (ref 7.5–12.5)
Monocytes Relative: 8.4 %
Neutro Abs: 2173 {cells}/uL (ref 1500–7800)
Neutrophils Relative %: 39.5 %
Platelets: 227 10*3/uL (ref 140–400)
RBC: 4.8 10*6/uL (ref 4.20–5.80)
RDW: 14.1 % (ref 11.0–15.0)
Total Lymphocyte: 50.1 %
WBC: 5.5 10*3/uL (ref 3.8–10.8)

## 2023-10-12 LAB — COMPREHENSIVE METABOLIC PANEL WITH GFR
AG Ratio: 1.8 (calc) (ref 1.0–2.5)
ALT: 64 U/L — ABNORMAL HIGH (ref 9–46)
AST: 43 U/L — ABNORMAL HIGH (ref 10–35)
Albumin: 4.2 g/dL (ref 3.6–5.1)
Alkaline phosphatase (APISO): 47 U/L (ref 35–144)
BUN: 20 mg/dL (ref 7–25)
CO2: 28 mmol/L (ref 20–32)
Calcium: 9.1 mg/dL (ref 8.6–10.3)
Chloride: 101 mmol/L (ref 98–110)
Creat: 0.86 mg/dL (ref 0.70–1.35)
Globulin: 2.4 g/dL (ref 1.9–3.7)
Glucose, Bld: 179 mg/dL — ABNORMAL HIGH (ref 65–99)
Potassium: 4.1 mmol/L (ref 3.5–5.3)
Sodium: 137 mmol/L (ref 135–146)
Total Bilirubin: 0.8 mg/dL (ref 0.2–1.2)
Total Protein: 6.6 g/dL (ref 6.1–8.1)
eGFR: 94 mL/min/{1.73_m2} (ref >=60)

## 2023-10-13 ENCOUNTER — Ambulatory Visit: Payer: Self-pay | Admitting: Rheumatology

## 2023-10-13 NOTE — Progress Notes (Signed)
 Blood glucose level is elevated at 179, AST and ALT remain elevated.  CBC is normal.  Please notify patient and forward results to his PCP.  Patient had no synovitis on the examination at the last visit.  Please advise him to reduce the dose of methotrexate  to 0.4 mL subcu weekly.

## 2023-10-13 NOTE — Telephone Encounter (Signed)
-----   Message from [facility] sent at 10/13/2023  7:58 AM EDT ----- Blood glucose level is elevated at 179, AST and ALT remain elevated.  CBC is normal.  Please notify patient and forward results to his PCP.  Patient had no synovitis on the examination at the last visit.  Please advise him to reduce the dose of metho trexate to 0.4 mL subcu weekly.

## 2023-10-25 DIAGNOSIS — E291 Testicular hypofunction: Secondary | ICD-10-CM | POA: Diagnosis not present

## 2023-10-26 NOTE — Progress Notes (Unsigned)
 Office Visit Note  Patient: Brandon Frazier             Date of Birth: 03-02-1956           MRN: [id number]             PCP: Orlena Bitters, MD Referring: Orlena Bitters, MD Visit Date: 10/27/2023 Occupation: @GUAROCC @  Subjective:  Pain in hands  History of Present Illness: Brandon Frazier is a 68 y.o. male with psoriatic arthritis, psoriasis and osteoarthritis.  He returns today after his last visit in March 2025.  He has been taking Humira  and methotrexate  on a regular basis.  He states for the last few months he has been noticing some discomfort in his left hand.  He has noticed a knot on his right and left second MCP joints and and ring finger.  He also notices prominence over the [facility] joint.  He states he has a stiffness lasting for about 30 minutes in the morning and then the pain eases off.  He has not noticed any joint swelling.  None of the other joints are painful.  He denies having psoriasis flare.  Some stiffness in his left SI joint off-and-on.    Activities of Daily Living:  Patient reports morning stiffness for 30 minutes.   Patient Denies nocturnal pain.  Difficulty dressing/grooming: Denies Difficulty climbing stairs: Denies Difficulty getting out of chair: Denies Difficulty using hands for taps, buttons, cutlery, and/or writing: Denies  Review of Systems  Constitutional:  Negative for fatigue.  HENT:  Negative for mouth sores and mouth dryness.   Eyes:  Negative for dryness.  Respiratory:  Negative for shortness of breath.   Cardiovascular:  Negative for chest pain and palpitations.  Gastrointestinal:  Negative for blood in stool, constipation and diarrhea.  Endocrine: Negative for increased urination.  Genitourinary:  Negative for involuntary urination.  Musculoskeletal:  Positive for joint pain, joint pain, joint swelling, myalgias, morning stiffness, muscle tenderness and myalgias. Negative for gait problem and muscle weakness.  Skin:  Negative for color change,  rash and sensitivity to sunlight.  Allergic/Immunologic: Negative for susceptible to infections.  Neurological:  Negative for dizziness and headaches.  Hematological:  Negative for swollen glands.  Psychiatric/Behavioral:  Positive for sleep disturbance. Negative for depressed mood. The patient is not nervous/anxious.     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   Psoriasis 12/10/2010   Knee pain, bilateral 10/30/2010   Hip pain, bilateral 10/30/2010   Leg length discrepancy 10/30/2010    Past Medical History:  Diagnosis Date   Anterior myocardial infarction (HCC) 1995   Anxiety    CAD (coronary artery disease)    Angioplasty of LAD 1995   Essential hypertension    GERD (gastroesophageal reflux disease)    Psoriatic arthritis (HCC)    Type 2 diabetes mellitus (HCC)     Family History  Problem Relation Age of Onset   GER disease Mother    Heart disease Mother    Liver cancer Father    Arthritis Son    Psoriasis  Son    Psoriasis Daughter    Past Surgical History:  Procedure Laterality Date   CARDIAC CATHETERIZATION     1995, 2001   COLONOSCOPY     COLONOSCOPY  07/04/2020   EYE SURGERY Bilateral    lasik   INSERTION OF MESH N/A 04/23/2016   Procedure: INSERTION OF MESH;  Surgeon: Juanita Norlander, MD;  Location: [facility] OR;  Service: General;  Laterality: N/A;   KNEE ARTHROPLASTY     left knee arthroscopy     1990, 1997   MEDIAL PARTIAL KNEE REPLACEMENT Right 05/2019   PARTIAL KNEE ARTHROPLASTY Left 02/16/2014   Procedure: LEFT KNEE UNI  ARTHROPLASTY;  Surgeon: Neville Barbone, MD;  Location: [facility];  Service: Orthopedics;  Laterality: Left;   SKIN BIOPSY  06/2023   UMBILICAL HERNIA REPAIR N/A 04/23/2016   Procedure: LAPAROSCOPIC UMBILICAL HERNIA;  Surgeon: Juanita Norlander, MD;  Location: [facility] OR;  Service: General;  Laterality: N/A;   Social History   Social History Narrative   Not on file   Immunization History  Administered Date(s) Administered   Influenza,inj,Quad PF,6+ Mos 03/19/2017, 03/24/2018   Influenza-Unspecified 03/18/2022   Rsv, Bivalent, Protein Subunit Rsvpref,pf Pattricia Bores) 03/18/2022   Tdap 05/14/2018   Zoster Recombinant(Shingrix) 10/27/2021     Objective: Vital Signs: BP 131/83 (BP Location: Left Arm, Patient Position: Sitting, Cuff Size: Normal)   Pulse 62   Resp 14   Ht 5\' 8"  (1.727 m)   Wt 187 lb 9.6 oz (85.1 kg)   BMI 28.52 kg/m    Physical Exam Vitals and nursing note reviewed.  Constitutional:      Appearance: He is well-developed.  HENT:     Head: Normocephalic and atraumatic.  Eyes:     Conjunctiva/sclera: Conjunctivae normal.     Pupils: Pupils are equal, round, and reactive to light.  Cardiovascular:     Rate and Rhythm: Normal rate and regular rhythm.     Heart sounds: Normal heart sounds.  Pulmonary:     Effort: Pulmonary effort is normal.     Breath sounds: Normal breath sounds.  Abdominal:     General: Bowel sounds are normal.     Palpations: Abdomen is soft.  Musculoskeletal:     Cervical back: Normal range of motion and neck supple.  Skin:    General: Skin is warm and dry.     Capillary Refill: Capillary refill takes less than 2 seconds.  Neurological:     Mental Status: He is alert and oriented to person, place, and time.  Psychiatric:        Behavior: Behavior normal.      Musculoskeletal Exam: Gait good range of motion of the cervical spine.  He had some discomfort with range of motion of the lumbar spine.  No SI joint tenderness was noted.   Shoulders, elbows, wrists, MCPs PIPs and DIPs with good range of motion.  Left CMC thickening was noted.  He had mild tenderness over the left CMC joint.  Bilateral PIP and DIP thickening was noted.  No synovitis was noted over MCPs PIPs or DIPs.  Hip joints and knee joints in good range of motion.  There was no tenderness over ankles or MTPs.  No plantar fasciitis or Achilles tendinitis was noted.  CDAI Exam: CDAI Score: -- Patient Global: --; Provider Global: -- Swollen: --; Tender: -- Joint Exam 10/27/2023   No joint exam has been documented for this visit   There is currently no information documented on  the homunculus. Go to the Rheumatology activity and complete the homunculus joint exam.  Investigation: No additional findings.  Imaging: No results found.  Recent Labs: Lab Results  Component Value Date   WBC 5.5 10/12/2023   HGB 14.8 10/12/2023   PLT 227 10/12/2023   NA 137 10/12/2023   K 4.1 10/12/2023   CL 101 10/12/2023   CO2 28 10/12/2023   GLUCOSE 179 (H) 10/12/2023   BUN 20 10/12/2023   CREATININE 0.86 10/12/2023   BILITOT 0.8 10/12/2023   AST 43 (H) 10/12/2023   ALT 64 (H) 10/12/2023   PROT 6.6 10/12/2023   CALCIUM 9.1 10/12/2023   GFRAA 104 11/07/2020   QFTBGOLDPLUS NEGATIVE 08/20/2023    Speciality Comments: No specialty comments available.  Procedures:  No procedures performed Allergies: Aspirin and Codeine   Assessment / Plan:     Visit Diagnoses: Psoriatic arthropathy (HCC)-he has not had a flare of psoriatic arthritis since the last visit.  He continues to have some stiffness in his hands.  He is concerned about some bony prominence in his hands which appears to be due to osteoarthritis.  Joint protection and muscle strengthening was discussed.  I reduced his dose of methotrexate  on Oct 12, 2023 after elevated LFTs were noted.  He has been on methotrexate  0.4 mL subcu weekly.  He has not experienced any flare on reduced dose of methotrexate .  We  discussed discontinue methotrexate  for now until his LFTs normalize.  Patient voiced understanding.  Psoriasis-he has not had any flares of psoriasis.  High risk medication use - Humira  40 mg sq injections every 14 days, MTX 0.4 ml sq once weekly, and folic acid  2 mg daily.  Oct 12, 2023 CBC was normal, CMP shows AST 43 and ALT 64.  Will recheck labs in 3 months.  TB Gold was negative on August 20, 2023.  Annual TB Gold was advised.  A handout immunization was placed in the AVS.  He was advised to hold Humira  if he develops an infection resume after infection resolves.  Annual skin examination to screen for skin cancer was advised.  Use of sunscreen and sun protection was discussed.  Elevated LFTs-will recheck labs in 3 months.  Dietary modifications and weight loss was discussed at length.  Pain in both hands -he has been having increased his stiffness in his hands send he has noticed some bony prominence.  No synovitis was noted on the examination.  Left CMC thickening and PIP and DIP prominence was noted.  Plan: XR Hand 2 View Right, XR Hand 2 View Left.  X-rays obtained today showed inflammatory arthritis and osteoarthritis overlap without any significant radiographic progression except for increased left CMC narrowing.  X-ray findings were reviewed with the patient.  He has been having left CMC discomfort.  Joint protection muscle strengthening was discussed.  A handout on hand exercises was given.  Primary osteoarthritis of both hands-he continues to have the stiffness and discomfort.  A handout on hand exercises was given.  A prescription for left CMC brace was given.  Status post left partial knee replacement-he had good range of motion without any warmth swelling or effusion.  Status post right partial knee replacement-no warmth swelling or effusion was noted.  He denies any discomfort.  Other medical problems are listed as follows:  History of diabetes mellitus  History of  hypertension-blood pressure was normal at 131/83.  History of hyperlipidemia - followed by his PCP.  Increased risk of heart disease with psoriatic  arthritis was discussed.  Need for regular exercise and dietary modifications were discussed.  History of coronary artery disease  Vitamin D  deficiency  Orders: Orders Placed This Encounter  Procedures   XR Hand 2 View Right   XR Hand 2 View Left   No orders of the defined types were placed in this encounter.    Follow-Up Instructions: Return in about 5 months (around 03/28/2024) for Psoriatic arthritis.   Nicholas Bari, MD  Note - This record has been created using Animal nutritionist.  Chart creation errors have been sought, but may not always  have been located. Such creation errors do not reflect on  the standard of medical care.

## 2023-10-27 ENCOUNTER — Encounter: Payer: Self-pay | Admitting: Rheumatology

## 2023-10-27 ENCOUNTER — Ambulatory Visit

## 2023-10-27 ENCOUNTER — Ambulatory Visit: Attending: Rheumatology | Admitting: Rheumatology

## 2023-10-27 ENCOUNTER — Ambulatory Visit (INDEPENDENT_AMBULATORY_CARE_PROVIDER_SITE_OTHER)

## 2023-10-27 VITALS — BP 131/83 | HR 62 | Resp 14 | Ht 68.0 in | Wt 187.6 lb

## 2023-10-27 DIAGNOSIS — Z8639 Personal history of other endocrine, nutritional and metabolic disease: Secondary | ICD-10-CM

## 2023-10-27 DIAGNOSIS — Z96651 Presence of right artificial knee joint: Secondary | ICD-10-CM | POA: Diagnosis not present

## 2023-10-27 DIAGNOSIS — L409 Psoriasis, unspecified: Secondary | ICD-10-CM

## 2023-10-27 DIAGNOSIS — L4050 Arthropathic psoriasis, unspecified: Secondary | ICD-10-CM

## 2023-10-27 DIAGNOSIS — M19041 Primary osteoarthritis, right hand: Secondary | ICD-10-CM

## 2023-10-27 DIAGNOSIS — Z96652 Presence of left artificial knee joint: Secondary | ICD-10-CM

## 2023-10-27 DIAGNOSIS — E559 Vitamin D deficiency, unspecified: Secondary | ICD-10-CM

## 2023-10-27 DIAGNOSIS — R7989 Other specified abnormal findings of blood chemistry: Secondary | ICD-10-CM | POA: Diagnosis not present

## 2023-10-27 DIAGNOSIS — M79641 Pain in right hand: Secondary | ICD-10-CM | POA: Diagnosis not present

## 2023-10-27 DIAGNOSIS — M79642 Pain in left hand: Secondary | ICD-10-CM | POA: Diagnosis not present

## 2023-10-27 DIAGNOSIS — Z79899 Other long term (current) drug therapy: Secondary | ICD-10-CM | POA: Diagnosis not present

## 2023-10-27 DIAGNOSIS — M19042 Primary osteoarthritis, left hand: Secondary | ICD-10-CM

## 2023-10-27 DIAGNOSIS — Z8679 Personal history of other diseases of the circulatory system: Secondary | ICD-10-CM

## 2023-10-27 NOTE — Patient Instructions (Addendum)
 Standing Labs We placed an order today for your standing lab work.   Please have your standing labs drawn in August and every 3 months  Please have your labs drawn 2 weeks prior to your appointment so that the provider can discuss your lab results at your appointment, if possible.  Please note that you may see your imaging and lab results in MyChart before we have reviewed them. We will contact you once all results are reviewed. Please allow our office up to 72 hours to thoroughly review all of the results before contacting the office for clarification of your results.  WALK-IN LAB HOURS  Monday through Thursday from 8:00 am -12:30 pm and 1:00 pm-4:00 pm and Friday from 8:00 am-12:00 pm.  Patients with office visits requiring labs will be seen before walk-in labs.  You may encounter longer than normal wait times. Please allow additional time. Wait times may be shorter on  Monday and Thursday afternoons.  We do not book appointments for walk-in labs. We appreciate your patience and understanding with our staff.   Labs are drawn by [facility]. Please bring your co-pay at the time of your lab draw.  You may receive a bill from [facility] for your lab work.  Please note if you are on Hydroxychloroquine and and an order has been placed for a Hydroxychloroquine level,  you will need to have it drawn 4 hours or more after your last dose.  If you wish to have your labs drawn at another location, please call the office 24 hours in advance so we can fax the orders.  The office is located at [address], [place], [place] [address]   If you have any questions regarding directions or hours of operation,  please call [phone].   As a reminder, please drink plenty of water prior to coming for your lab work. Thanks!   Vaccines You are taking a medication(s) that can suppress your immune system.  The following immunizations are recommended: Flu annually Covid-19  Td/Tdap (tetanus,  diphtheria, pertussis) every 10 years Pneumonia (Prevnar 15 then Pneumovax 23 at least 1 year apart.  Alternatively, can take Prevnar 20 without needing additional dose) Shingrix: 2 doses from 4 weeks to 6 months apart  Please check with your PCP to make sure you are up to date.   If you have signs or symptoms of an infection or start antibiotics: First, call your PCP for workup of your infection. Hold your medication through the infection, until you complete your antibiotics, and until symptoms resolve if you take the following: Injectable medication (Actemra, Benlysta, Cimzia, Cosentyx, Enbrel, Humira , Kevzara, Orencia, Remicade, Simponi, Stelara, Taltz, Tremfya) Methotrexate  Leflunomide (Arava) Mycophenolate (Cellcept) Xeljanz, Olumiant, or Rinvoq   Please get an annual examination while you are on Humira .  Please use sunscreen and sun protection. Hand Exercises Hand exercises can be helpful for almost anyone. They can strengthen your hands and improve flexibility and movement. The exercises can also increase blood flow to the hands. These results can make your work and daily tasks easier for you. Hand exercises can be especially helpful for people who have joint pain from arthritis or nerve damage from using their hands over and over. These exercises can also help people who injure a hand. Exercises Most of these hand exercises are gentle stretching and motion exercises. It is usually safe to do them often throughout the day. Warming up your hands before exercise may help reduce stiffness. You can do this with gentle  massage or by placing your hands in warm water for 10-15 minutes. It is normal to feel some stretching, pulling, tightness, or mild discomfort when you begin new exercises. In time, this will improve. Remember to always be careful and stop right away if you feel sudden, very bad pain or your pain gets worse. You want to get better and be safe. Ask your health care provider  which exercises are safe for you. Do exercises exactly as told by your provider and adjust them as told. Do not begin these exercises until told by your provider. Knuckle bend or "claw" fist  Stand or sit with your arm, hand, and all five fingers pointed straight up. Make sure to keep your wrist straight. Gently bend your fingers down toward your palm until the tips of your fingers are touching your palm. Keep your big knuckle straight and only bend the small knuckles in your fingers. Hold this position for 10 seconds. Straighten your fingers back to your starting position. Repeat this exercise 5-10 times with each hand. Full finger fist  Stand or sit with your arm, hand, and all five fingers pointed straight up. Make sure to keep your wrist straight. Gently bend your fingers into your palm until the tips of your fingers are touching the middle of your palm. Hold this position for 10 seconds. Extend your fingers back to your starting position, stretching every joint fully. Repeat this exercise 5-10 times with each hand. Straight fist  Stand or sit with your arm, hand, and all five fingers pointed straight up. Make sure to keep your wrist straight. Gently bend your fingers at the big knuckle, where your fingers meet your hand, and at the middle knuckle. Keep the knuckle at the tips of your fingers straight and try to touch the bottom of your palm. Hold this position for 10 seconds. Extend your fingers back to your starting position, stretching every joint fully. Repeat this exercise 5-10 times with each hand. Tabletop  Stand or sit with your arm, hand, and all five fingers pointed straight up. Make sure to keep your wrist straight. Gently bend your fingers at the big knuckle, where your fingers meet your hand, as far down as you can. Keep the small knuckles in your fingers straight. Think of forming a tabletop with your fingers. Hold this position for 10 seconds. Extend your fingers back to  your starting position, stretching every joint fully. Repeat this exercise 5-10 times with each hand. Finger spread  Place your hand flat on a table with your palm facing down. Make sure your wrist stays straight. Spread your fingers and thumb apart from each other as far as you can until you feel a gentle stretch. Hold this position for 10 seconds. Bring your fingers and thumb tight together again. Hold this position for 10 seconds. Repeat this exercise 5-10 times with each hand. Making circles  Stand or sit with your arm, hand, and all five fingers pointed straight up. Make sure to keep your wrist straight. Make a circle by touching the tip of your thumb to the tip of your index finger. Hold for 10 seconds. Then open your hand wide. Repeat this motion with your thumb and each of your fingers. Repeat this exercise 5-10 times with each hand. Thumb motion  Sit with your forearm resting on a table and your wrist straight. Your thumb should be facing up toward the ceiling. Keep your fingers relaxed as you move your thumb. Lift your thumb up as  high as you can toward the ceiling. Hold for 10 seconds. Bend your thumb across your palm as far as you can, reaching the tip of your thumb for the small finger (pinkie) side of your palm. Hold for 10 seconds. Repeat this exercise 5-10 times with each hand. Grip strengthening  Hold a stress ball or other soft ball in the middle of your hand. Slowly increase the pressure, squeezing the ball as much as you can without causing pain. Think of bringing the tips of your fingers into the middle of your palm. All of your finger joints should bend when doing this exercise. Hold your squeeze for 10 seconds, then relax. Repeat this exercise 5-10 times with each hand. Contact a health care provider if: Your hand pain or discomfort gets much worse when you do an exercise. Your hand pain or discomfort does not improve within 2 hours after you exercise. If you have  either of these problems, stop doing these exercises right away. Do not do them again unless your provider says that you can. Get help right away if: You develop sudden, severe hand pain or swelling. If this happens, stop doing these exercises right away. Do not do them again unless your provider says that you can. This information is not intended to replace advice given to you by your health care provider. Make sure you discuss any questions you have with your health care provider. Document Revised: 05/26/2022 Document Reviewed: 05/26/2022 Elsevier Patient Education  2024 [employer] Low Back Sprain or Strain Rehab Ask your health care provider which exercises are safe for you. Do exercises exactly as told by your health care provider and adjust them as directed. It is normal to feel mild stretching, pulling, tightness, or discomfort as you do these exercises. Stop right away if you feel sudden pain or your pain gets worse. Do not begin these exercises until told by your health care provider. Stretching and range-of-motion exercises These exercises warm up your muscles and joints and improve the movement and flexibility of your back. These exercises also help to relieve pain, numbness, and tingling. Lumbar rotation  Lie on your back on a firm bed or the floor with your knees bent. Straighten your arms out to your sides so each arm forms a 90-degree angle (right angle) with a side of your body. Slowly move (rotate) both of your knees to one side of your body until you feel a stretch in your lower back (lumbar). Try not to let your shoulders lift off the floor. Hold this position for __________ seconds. Tense your abdominal muscles and slowly move your knees back to the starting position. Repeat this exercise on the other side of your body. Repeat __________ times. Complete this exercise __________ times a day. Single knee to chest  Lie on your back on a firm bed or the floor with both legs  straight. Bend one of your knees. Use your hands to move your knee up toward your chest until you feel a gentle stretch in your lower back and buttock. Hold your leg in this position by holding on to the front of your knee. Keep your other leg as straight as possible. Hold this position for __________ seconds. Slowly return to the starting position. Repeat with your other leg. Repeat __________ times. Complete this exercise __________ times a day. Prone extension on elbows  Lie on your abdomen on a firm bed or the floor (prone position). Prop yourself up on your elbows. Use your arms  to help lift your chest up until you feel a gentle stretch in your abdomen and your lower back. This will place some of your body weight on your elbows. If this is uncomfortable, try stacking pillows under your chest. Your hips should stay down, against the surface that you are lying on. Keep your hip and back muscles relaxed. Hold this position for __________ seconds. Slowly relax your upper body and return to the starting position. Repeat __________ times. Complete this exercise __________ times a day. Strengthening exercises These exercises build strength and endurance in your back. Endurance is the ability to use your muscles for a long time, even after they get tired. Pelvic tilt This exercise strengthens the muscles that lie deep in the abdomen. Lie on your back on a firm bed or the floor with your legs extended. Bend your knees so they are pointing toward the ceiling and your feet are flat on the floor. Tighten your lower abdominal muscles to press your lower back against the floor. This motion will tilt your pelvis so your tailbone points up toward the ceiling instead of pointing to your feet or the floor. To help with this exercise, you may place a small towel under your lower back and try to push your back into the towel. Hold this position for __________ seconds. Let your muscles relax completely  before you repeat this exercise. Repeat __________ times. Complete this exercise __________ times a day. Alternating arm and leg raises  Get on your hands and knees on a firm surface. If you are on a hard floor, you may want to use padding, such as an exercise mat, to cushion your knees. Line up your arms and legs. Your hands should be directly below your shoulders, and your knees should be directly below your hips. Lift your left leg behind you. At the same time, raise your right arm and straighten it in front of you. Do not lift your leg higher than your hip. Do not lift your arm higher than your shoulder. Keep your abdominal and back muscles tight. Keep your hips facing the ground. Do not arch your back. Keep your balance carefully, and do not hold your breath. Hold this position for __________ seconds. Slowly return to the starting position. Repeat with your right leg and your left arm. Repeat __________ times. Complete this exercise __________ times a day. Abdominal set with straight leg raise  Lie on your back on a firm bed or the floor. Bend one of your knees and keep your other leg straight. Tense your abdominal muscles and lift your straight leg up, 4-6 inches (10-15 cm) off the ground. Keep your abdominal muscles tight and hold this position for __________ seconds. Do not hold your breath. Do not arch your back. Keep it flat against the ground. Keep your abdominal muscles tense as you slowly lower your leg back to the starting position. Repeat with your other leg. Repeat __________ times. Complete this exercise __________ times a day. Single leg lower with bent knees Lie on your back on a firm bed or the floor. Tense your abdominal muscles and lift your feet off the floor, one foot at a time, so your knees and hips are bent in 90-degree angles (right angles). Your knees should be over your hips and your lower legs should be parallel to the floor. Keeping your abdominal  muscles tense and your knee bent, slowly lower one of your legs so your toe touches the ground. Lift your leg back  up to return to the starting position. Do not hold your breath. Do not let your back arch. Keep your back flat against the ground. Repeat with your other leg. Repeat __________ times. Complete this exercise __________ times a day. Posture and body mechanics Good posture and healthy body mechanics can help to relieve stress in your body's tissues and joints. Body mechanics refers to the movements and positions of your body while you do your daily activities. Posture is part of body mechanics. Good posture means: Your spine is in its natural S-curve position (neutral). Your shoulders are pulled back slightly. Your head is not tipped forward (neutral). Follow these guidelines to improve your posture and body mechanics in your everyday activities. Standing  When standing, keep your spine neutral and your feet about hip-width apart. Keep a slight bend in your knees. Your ears, shoulders, and hips should line up. When you do a task in which you stand in one place for a long time, place one foot up on a stable object that is 2-4 inches (5-10 cm) high, such as a footstool. This helps keep your spine neutral. Sitting  When sitting, keep your spine neutral and keep your feet flat on the floor. Use a footrest, if necessary, and keep your thighs parallel to the floor. Avoid rounding your shoulders, and avoid tilting your head forward. When working at a desk or a computer, keep your desk at a height where your hands are slightly lower than your elbows. Slide your chair under your desk so you are close enough to maintain good posture. When working at a computer, place your monitor at a height where you are looking straight ahead and you do not have to tilt your head forward or downward to look at the screen. Resting When lying down and resting, avoid positions that are most painful for you. If  you have pain with activities such as sitting, bending, stooping, or squatting, lie in a position in which your body does not bend very much. For example, avoid curling up on your side with your arms and knees near your chest (fetal position). If you have pain with activities such as standing for a long time or reaching with your arms, lie with your spine in a neutral position and bend your knees slightly. Try the following positions: Lying on your side with a pillow between your knees. Lying on your back with a pillow under your knees. Lifting  When lifting objects, keep your feet at least shoulder-width apart and tighten your abdominal muscles. Bend your knees and hips and keep your spine neutral. It is important to lift using the strength of your legs, not your back. Do not lock your knees straight out. Always ask for help to lift heavy or awkward objects. This information is not intended to replace advice given to you by your health care provider. Make sure you discuss any questions you have with your health care provider. Document Revised: 09/14/2022 Document Reviewed: 07/29/2020 Elsevier Patient Education  2024 [employer].

## 2023-11-25 ENCOUNTER — Ambulatory Visit: Admitting: Physician Assistant

## 2023-11-30 ENCOUNTER — Telehealth: Payer: Self-pay

## 2023-11-30 NOTE — Telephone Encounter (Signed)
 Reached out to patient and scheduled for 12/03/2023 for bilateral thumb injection.

## 2023-11-30 NOTE — Telephone Encounter (Signed)
Okay to schedule for injection?

## 2023-11-30 NOTE — Telephone Encounter (Signed)
 Patient called the office stating he is having a lot of pain in both thumbs as well as swelling. Would like to know if he can come in for injections that was discussed in the past. He has been wearing the compression gloves to help with swelling but its not helping. Please advise.

## 2023-12-03 ENCOUNTER — Ambulatory Visit: Attending: Rheumatology | Admitting: Rheumatology

## 2023-12-03 ENCOUNTER — Ambulatory Visit

## 2023-12-03 ENCOUNTER — Other Ambulatory Visit: Payer: Self-pay | Admitting: Physician Assistant

## 2023-12-03 DIAGNOSIS — M19042 Primary osteoarthritis, left hand: Secondary | ICD-10-CM | POA: Diagnosis not present

## 2023-12-03 DIAGNOSIS — M79642 Pain in left hand: Secondary | ICD-10-CM | POA: Diagnosis not present

## 2023-12-03 DIAGNOSIS — M79641 Pain in right hand: Secondary | ICD-10-CM

## 2023-12-03 DIAGNOSIS — M19041 Primary osteoarthritis, right hand: Secondary | ICD-10-CM

## 2023-12-03 MED ORDER — LIDOCAINE HCL 1 % IJ SOLN
0.5000 mL | INTRAMUSCULAR | Status: AC | PRN
  Administered 2023-12-03: .5 mL

## 2023-12-03 MED ORDER — TRIAMCINOLONE ACETONIDE 40 MG/ML IJ SUSP
20.0000 mg | INTRAMUSCULAR | Status: AC | PRN
  Administered 2023-12-03: 20 mg via INTRA_ARTICULAR

## 2023-12-03 NOTE — Progress Notes (Signed)
   Procedure Note  Patient: Brandon Frazier             Date of Birth: [birth year]           MRN: [id number]             Visit Date: 12/03/2023  Procedures: Visit Diagnoses:  1. Pain in both hands   2. Primary osteoarthritis of both hands   He has been experiencing increased pain and discomfort in her bilateral CMC joints.  He came today to get injections and bilateral CMC joints.  Side effects of cortisone injection were discussed.  After informed consent was obtained bilateral CMC joints were injected as described below.  Small Joint Inj: bilateral thumb CMC on 12/03/2023 11:55 AM Indications: pain Details: 27 G needle, ultrasound-guided radial approach  Spinal Needle: No  Medications (Right): 0.5 mL lidocaine  1 %; 20 mg triamcinolone  acetonide 40 MG/ML Aspirate (Right): 0 mL Medications (Left): 0.5 mL lidocaine  1 %; 20 mg triamcinolone  acetonide 40 MG/ML Aspirate (Left): 0 mL Outcome: tolerated well, no immediate complications  Risk of infection, tendon injury, nerve injury, dermal atrophy, hypopigmentation were discussed. Procedure, treatment alternatives, risks and benefits explained, specific risks discussed. Consent was given by the patient. Immediately prior to procedure a time out was called to verify the correct patient, procedure, equipment, support staff and site/side marked as required. Patient was prepped and draped in the usual sterile fashion.     Patient tolerated the procedure well.  Postprocedure instructions were given.  Use of CMC brace was advised.  Maya Nash, MD

## 2023-12-03 NOTE — Telephone Encounter (Signed)
 Last Fill: 11/02/2023  Next Visit: 01/27/2024  Last Visit: 10/27/2023  Dx: Psoriatic arthropathy   Current Dose per office note on 10/27/2023: folic acid  2 mg daily   Okay to refill Folic Acid ?

## 2023-12-22 DIAGNOSIS — E119 Type 2 diabetes mellitus without complications: Secondary | ICD-10-CM | POA: Diagnosis not present

## 2023-12-22 DIAGNOSIS — I10 Essential (primary) hypertension: Secondary | ICD-10-CM | POA: Diagnosis not present

## 2023-12-22 DIAGNOSIS — Z299 Encounter for prophylactic measures, unspecified: Secondary | ICD-10-CM | POA: Diagnosis not present

## 2024-01-12 ENCOUNTER — Other Ambulatory Visit: Payer: Self-pay | Admitting: *Deleted

## 2024-01-12 DIAGNOSIS — M25562 Pain in left knee: Secondary | ICD-10-CM | POA: Diagnosis not present

## 2024-01-12 DIAGNOSIS — Z79899 Other long term (current) drug therapy: Secondary | ICD-10-CM

## 2024-01-13 ENCOUNTER — Ambulatory Visit: Payer: Self-pay | Admitting: Rheumatology

## 2024-01-13 LAB — CBC WITH DIFFERENTIAL/PLATELET
Absolute Lymphocytes: 3842 {cells}/uL (ref 850–3900)
Absolute Monocytes: 508 {cells}/uL (ref 200–950)
Basophils Absolute: 46 {cells}/uL (ref 0–200)
Basophils Relative: 0.6 %
Eosinophils Absolute: 108 {cells}/uL (ref 15–500)
Eosinophils Relative: 1.4 %
HCT: 47.7 % (ref 38.5–50.0)
Hemoglobin: 15.7 g/dL (ref 13.2–17.1)
MCH: 31.2 pg (ref 27.0–33.0)
MCHC: 32.9 g/dL (ref 32.0–36.0)
MCV: 94.8 fL (ref 80.0–100.0)
MPV: 10.1 fL (ref 7.5–12.5)
Monocytes Relative: 6.6 %
Neutro Abs: 3196 {cells}/uL (ref 1500–7800)
Neutrophils Relative %: 41.5 %
Platelets: 242 Thousand/uL (ref 140–400)
RBC: 5.03 Million/uL (ref 4.20–5.80)
RDW: 13.4 % (ref 11.0–15.0)
Total Lymphocyte: 49.9 %
WBC: 7.7 Thousand/uL (ref 3.8–10.8)

## 2024-01-13 LAB — COMPREHENSIVE METABOLIC PANEL WITH GFR
AG Ratio: 1.5 (calc) (ref 1.0–2.5)
ALT: 63 U/L — ABNORMAL HIGH (ref 9–46)
AST: 40 U/L — ABNORMAL HIGH (ref 10–35)
Albumin: 4.4 g/dL (ref 3.6–5.1)
Alkaline phosphatase (APISO): 54 U/L (ref 35–144)
BUN: 22 mg/dL (ref 7–25)
CO2: 28 mmol/L (ref 20–32)
Calcium: 9.5 mg/dL (ref 8.6–10.3)
Chloride: 99 mmol/L (ref 98–110)
Creat: 0.78 mg/dL (ref 0.70–1.28)
Globulin: 3 g/dL (ref 1.9–3.7)
Glucose, Bld: 166 mg/dL — ABNORMAL HIGH (ref 65–99)
Potassium: 4.3 mmol/L (ref 3.5–5.3)
Sodium: 135 mmol/L (ref 135–146)
Total Bilirubin: 0.8 mg/dL (ref 0.2–1.2)
Total Protein: 7.4 g/dL (ref 6.1–8.1)
eGFR: 96 mL/min/1.73m2 (ref >=60)

## 2024-01-13 NOTE — Progress Notes (Unsigned)
 Office Visit Note  Patient: Brandon Frazier             Date of Birth: 02-Dec-1955           MRN: [id number]             PCP: Rosamond Leta NOVAK, MD Referring: Rosamond Leta NOVAK, MD Visit Date: 01/27/2024 Occupation: @GUAROCC @  Subjective:  Medication monitoring  History of Present Illness: KIWAN GADSDEN is a 68 y.o. male with history of psoriatic arthritis and osteoarthritis.  Patient remains on  Humira  40 mg sq injections every 21 days.  Patient was advised to discontinue methotrexate  at his last office visit on 10/27/2023.  He has not noticed any new or worsening symptoms on Humira  as monotherapy.  He has been able to space Humira  dosing to every 21 days without any new or worsening symptoms.  He has not been experiencing any morning stiffness, nocturnal pain, or difficulty performing ADLs.  Both CMC joint pain has resolved after having cortisone injections performed on 12/03/2023.  He denies any psoriasis.  He denies any SI joint pain.  He denies any Achilles tendinitis or plantar fasciitis.  He denies taking any over-the-counter products for symptomatic relief.  He denies any recent or recurrent infections.   Activities of Daily Living:  Patient reports morning stiffness for 0 minute.   Patient Denies nocturnal pain.  Difficulty dressing/grooming: Denies Difficulty climbing stairs: Denies Difficulty getting out of chair: Denies Difficulty using hands for taps, buttons, cutlery, and/or writing: Denies  Review of Systems  Constitutional:  Negative for fatigue.  HENT:  Negative for mouth sores and mouth dryness.   Eyes:  Negative for dryness.  Respiratory:  Negative for shortness of breath.   Cardiovascular:  Negative for chest pain and palpitations.  Gastrointestinal:  Negative for blood in stool, constipation and diarrhea.  Endocrine: Negative for increased urination.  Genitourinary:  Negative for involuntary urination.  Musculoskeletal:  Negative for joint pain, gait problem, joint pain,  joint swelling, myalgias, muscle weakness, morning stiffness, muscle tenderness and myalgias.  Skin:  Negative for color change, rash, hair loss and sensitivity to sunlight.  Allergic/Immunologic: Negative for susceptible to infections.  Neurological:  Negative for dizziness and headaches.  Hematological:  Negative for swollen glands.  Psychiatric/Behavioral:  Negative for depressed mood and sleep disturbance. The patient is not nervous/anxious.     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   Psoriasis 12/10/2010   Knee pain, bilateral 10/30/2010   Hip pain, bilateral 10/30/2010   Leg length discrepancy 10/30/2010    Past Medical History:  Diagnosis Date   Anterior myocardial infarction (HCC) 1995   Anxiety    CAD (coronary artery disease)    Angioplasty of LAD 1995   Essential hypertension    GERD (gastroesophageal reflux disease)    Psoriatic arthritis (HCC)    Type 2 diabetes mellitus (HCC)     Family History  Problem Relation Age of Onset   GER disease Mother    Heart disease Mother    Liver cancer Father    Arthritis  Son    Psoriasis Son    Psoriasis Daughter    Past Surgical History:  Procedure Laterality Date   CARDIAC CATHETERIZATION     1995, 2001   COLONOSCOPY     COLONOSCOPY  07/04/2020   EYE SURGERY Bilateral    lasik   INSERTION OF MESH N/A 04/23/2016   Procedure: INSERTION OF MESH;  Surgeon: Alm Angle, MD;  Location: [facility] OR;  Service: General;  Laterality: N/A;   KNEE ARTHROPLASTY     left knee arthroscopy     1990,  1997   MEDIAL PARTIAL KNEE REPLACEMENT Right 05/2019   PARTIAL KNEE ARTHROPLASTY Left 02/16/2014   Procedure: LEFT KNEE UNI ARTHROPLASTY;  Surgeon: Fonda SHAUNNA Olmsted, MD;  Location: [facility];  Service: Orthopedics;  Laterality: Left;   SKIN BIOPSY  06/2023   UMBILICAL HERNIA REPAIR N/A 04/23/2016   Procedure: LAPAROSCOPIC UMBILICAL HERNIA;  Surgeon: Alm Angle, MD;  Location: [facility] OR;  Service: General;  Laterality: N/A;   Social History   Social History Narrative   Not on file   Immunization History  Administered Date(s) Administered    sv, Bivalent, Protein Subunit Rsvpref,pf Marlow) 03/18/2022   Influenza,inj,Quad PF,6+ Mos 03/19/2017, 03/24/2018   Influenza-Unspecified 03/18/2022   Tdap 05/14/2018   Zoster Recombinant(Shingrix) 10/27/2021     Objective: Vital Signs: BP 135/78 (BP Location: Left Arm, Patient Position: Sitting, Cuff Size: Normal)   Pulse (!) 59   Resp 14   Ht 5' 8 (1.727 m)   Wt 195 lb 6.4 oz (88.6 kg)   BMI 29.71 kg/m    Physical Exam Vitals and nursing note reviewed.  Constitutional:      Appearance: He is well-developed.  HENT:     Head: Normocephalic and atraumatic.  Eyes:     Conjunctiva/sclera: Conjunctivae normal.     Pupils: Pupils are equal, round, and reactive to light.  Cardiovascular:     Rate and Rhythm: Normal rate and regular rhythm.     Heart sounds: Normal heart sounds.  Pulmonary:     Effort: Pulmonary effort is normal.     Breath sounds: Normal breath sounds.  Abdominal:     General: Bowel sounds are normal.     Palpations: Abdomen is soft.  Musculoskeletal:     Cervical back: Normal range of motion and neck supple.  Skin:    General: Skin is warm and dry.     Capillary Refill: Capillary refill takes less than 2 seconds.  Neurological:     Mental Status: He is alert and oriented to person, place, and time.  Psychiatric:        Behavior: Behavior normal.      Musculoskeletal Exam: C-spine, thoracic  spine, lumbar spine have good range of motion.  No midline spinal tenderness.  No SI joint tenderness.  Shoulder joints, elbow joints, wrist joints, MCPs, PIPs, DIPs have good range of motion with no synovitis.  CMC joint prominence noted bilaterally.  PIP and DIP thickening noted.  Hip joints have good range of motion no groin pain.  Bilateral partial knee replacements have good range of motion no warmth or effusion.  Ankle joints have good range of motion with no tenderness or joint swelling.  No evidence of Achilles tendinitis.  CDAI Exam: CDAI Score: -- Patient Global: --; Provider Global: -- Swollen: --; Tender: -- Joint Exam 01/27/2024   No joint exam has been documented for this visit   There is currently no information documented on the homunculus. Go  to the Rheumatology activity and complete the homunculus joint exam.  Investigation: No additional findings.  Imaging: No results found.  Recent Labs: Lab Results  Component Value Date   WBC 7.7 01/12/2024   HGB 15.7 01/12/2024   PLT 242 01/12/2024   NA 135 01/12/2024   K 4.3 01/12/2024   CL 99 01/12/2024   CO2 28 01/12/2024   GLUCOSE 166 (H) 01/12/2024   BUN 22 01/12/2024   CREATININE 0.78 01/12/2024   BILITOT 0.8 01/12/2024   AST 40 (H) 01/12/2024   ALT 63 (H) 01/12/2024   PROT 7.4 01/12/2024   CALCIUM 9.5 01/12/2024   GFRAA 104 11/07/2020   QFTBGOLDPLUS NEGATIVE 08/20/2023    Speciality Comments: No specialty comments available.  Procedures:  No procedures performed Allergies: Aspirin and Codeine    Assessment / Plan:     Visit Diagnoses: Psoriatic arthropathy (HCC): No synovitis or dactylitis noted on examination today.  No evidence of Achilles tendinitis, plantar fasciitis, or SI joint pain.  No active psoriasis at this time.  No signs or symptoms of uveitis.  The patient has remained on Humira  40 mg subcu days injections once every 21 days.  He has not noticed any breakthrough symptoms as he has been spacing  the dosing of Humira .  He has been off of methotrexate  since June 2025 due to clinically doing well as well as due to an elevation of liver enzymes.  He has not noticed any new or worsening symptoms since discontinuing methotrexate .  He has been avoiding the use of over-the-counter products.  Plan to continue Humira  as monotherapy and will continue to monitor lab work closely.  He was advised to notify us  if he develops any signs or symptoms of a flare.  High risk medication use - Humira  40 mg sq injections every 21 days Discontinued methotrexate  in June 2025 due to elevated LFTs.  CBC and CMP updated on 01/12/24.  His next lab work will be due in November and every 3 months. TB gold negative on 08/20/23 No recent or recurrent infections.  Discussed the importance of holding humira  and methotrexate  if he develops signs or symptoms of an infection and to resume once the infection has completely cleared.   Psoriasis: No active psoriasis at this time.  Primary osteoarthritis of both hands: Patient had bilateral CMC joint cortisone injections performed on 12/03/2023 which provided significant relief.  No tenderness or active inflammation noted on examination today.  Elevated LFTs: AST 40 and ALT 63 on 01/04/2024.  Discontinued methotrexate  in June 2025.  Avoiding the use of over-the-counter products.  Very rarely drinks alcohol .  We will continue to monitor lab work closely.  Status post left partial knee replacement: He has occasional discomfort in the lateral compartment of the left knee.  He was recent evaluated by orthopedics and had updated x-rays which did not reveal any acute abnormalities.  No warmth or effusion noted today.  Status post right partial knee replacement: Doing well.  No warmth or effusion noted.  Other medical conditions are listed as follows:  History of diabetes mellitus  History of hypertension: Blood pressure was 135/78 today in the office.  History of  hyperlipidemia  History of coronary artery disease  Vitamin D  deficiency  Orders: No orders of the defined types were placed in this encounter.  No orders of the defined types were placed in this encounter.   Follow-Up Instructions: Return in about 5 months (around 06/28/2024) for Psoriatic arthritis.   Waddell CHRISTELLA Craze, PA-C  Note - This record has been created using [employer].  Chart creation errors have been sought, but may not always  have been located. Such creation errors do not reflect on  the standard of medical care.

## 2024-01-13 NOTE — Progress Notes (Signed)
 Glucose is elevated at 166, LFTs remain elevated.  Most likely due to combination of the statin and methotrexate .  If patient is doing well he may discontinue methotrexate .  CBC is normal.

## 2024-01-22 ENCOUNTER — Other Ambulatory Visit: Payer: Self-pay | Admitting: Physician Assistant

## 2024-01-22 DIAGNOSIS — L4050 Arthropathic psoriasis, unspecified: Secondary | ICD-10-CM

## 2024-01-25 NOTE — Telephone Encounter (Signed)
 Last Fill: 10/11/2023  Labs: 01/12/2024  Glucose is elevated at 166, LFTs remain elevated. Most likely due to combination of the statin and methotrexate . If patient is doing well he may discontinue methotrexate . CBC is normal.   TB Gold: 08/20/2023 TB Gold Negative   Next Visit: 01/27/2024  Last Visit: 10/27/2023  DX: Psoriatic arthropathy   Current Dose per office note 10/27/2023: Humira  40 mg sq injections every 14 days   Okay to refill Humira ?

## 2024-01-27 ENCOUNTER — Ambulatory Visit: Attending: Physician Assistant | Admitting: Physician Assistant

## 2024-01-27 ENCOUNTER — Encounter: Payer: Self-pay | Admitting: Physician Assistant

## 2024-01-27 VITALS — BP 135/78 | HR 59 | Resp 14 | Ht 68.0 in | Wt 195.4 lb

## 2024-01-27 DIAGNOSIS — L409 Psoriasis, unspecified: Secondary | ICD-10-CM

## 2024-01-27 DIAGNOSIS — Z8679 Personal history of other diseases of the circulatory system: Secondary | ICD-10-CM | POA: Diagnosis not present

## 2024-01-27 DIAGNOSIS — R7989 Other specified abnormal findings of blood chemistry: Secondary | ICD-10-CM

## 2024-01-27 DIAGNOSIS — Z8639 Personal history of other endocrine, nutritional and metabolic disease: Secondary | ICD-10-CM | POA: Diagnosis not present

## 2024-01-27 DIAGNOSIS — M19041 Primary osteoarthritis, right hand: Secondary | ICD-10-CM | POA: Diagnosis not present

## 2024-01-27 DIAGNOSIS — M19042 Primary osteoarthritis, left hand: Secondary | ICD-10-CM

## 2024-01-27 DIAGNOSIS — Z96652 Presence of left artificial knee joint: Secondary | ICD-10-CM | POA: Diagnosis not present

## 2024-01-27 DIAGNOSIS — L4050 Arthropathic psoriasis, unspecified: Secondary | ICD-10-CM | POA: Diagnosis not present

## 2024-01-27 DIAGNOSIS — E559 Vitamin D deficiency, unspecified: Secondary | ICD-10-CM

## 2024-01-27 DIAGNOSIS — Z79899 Other long term (current) drug therapy: Secondary | ICD-10-CM | POA: Diagnosis not present

## 2024-01-27 DIAGNOSIS — Z96651 Presence of right artificial knee joint: Secondary | ICD-10-CM

## 2024-01-27 NOTE — Patient Instructions (Signed)
 Standing Labs We placed an order today for your standing lab work.   Please have your standing labs drawn in November and every 3 months   Please have your labs drawn 2 weeks prior to your appointment so that the provider can discuss your lab results at your appointment, if possible.  Please note that you may see your imaging and lab results in MyChart before we have reviewed them. We will contact you once all results are reviewed. Please allow our office up to 72 hours to thoroughly review all of the results before contacting the office for clarification of your results.  WALK-IN LAB HOURS  Monday through Thursday from 8:00 am -12:30 pm and 1:00 pm-4:30 pm and Friday from 8:00 am-12:00 pm.  Patients with office visits requiring labs will be seen before walk-in labs.  You may encounter longer than normal wait times. Please allow additional time. Wait times may be shorter on  Monday and Thursday afternoons.  We do not book appointments for walk-in labs. We appreciate your patience and understanding with our staff.   Labs are drawn by [facility]. Please bring your co-pay at the time of your lab draw.  You may receive a bill from [facility] for your lab work.  Please note if you are on Hydroxychloroquine  and and an order has been placed for a Hydroxychloroquine  level,  you will need to have it drawn 4 hours or more after your last dose.  If you wish to have your labs drawn at another location, please call the office 24 hours in advance so we can fax the orders.  The office is located at [address], [place], [place] [address]   If you have any questions regarding directions or hours of operation,  please call [phone].   As a reminder, please drink plenty of water prior to coming for your lab work. Thanks!

## 2024-03-09 DIAGNOSIS — Z79899 Other long term (current) drug therapy: Secondary | ICD-10-CM | POA: Diagnosis not present

## 2024-03-09 DIAGNOSIS — Z1339 Encounter for screening examination for other mental health and behavioral disorders: Secondary | ICD-10-CM | POA: Diagnosis not present

## 2024-03-09 DIAGNOSIS — Z7189 Other specified counseling: Secondary | ICD-10-CM | POA: Diagnosis not present

## 2024-03-09 DIAGNOSIS — Z299 Encounter for prophylactic measures, unspecified: Secondary | ICD-10-CM | POA: Diagnosis not present

## 2024-03-09 DIAGNOSIS — E7800 Pure hypercholesterolemia, unspecified: Secondary | ICD-10-CM | POA: Diagnosis not present

## 2024-03-09 DIAGNOSIS — Z0000 Encounter for general adult medical examination without abnormal findings: Secondary | ICD-10-CM | POA: Diagnosis not present

## 2024-03-09 DIAGNOSIS — Z1331 Encounter for screening for depression: Secondary | ICD-10-CM | POA: Diagnosis not present

## 2024-03-09 DIAGNOSIS — R5383 Other fatigue: Secondary | ICD-10-CM | POA: Diagnosis not present

## 2024-03-09 DIAGNOSIS — Z125 Encounter for screening for malignant neoplasm of prostate: Secondary | ICD-10-CM | POA: Diagnosis not present

## 2024-03-09 DIAGNOSIS — E1165 Type 2 diabetes mellitus with hyperglycemia: Secondary | ICD-10-CM | POA: Diagnosis not present

## 2024-03-21 DIAGNOSIS — I25119 Atherosclerotic heart disease of native coronary artery with unspecified angina pectoris: Secondary | ICD-10-CM | POA: Diagnosis not present

## 2024-03-21 DIAGNOSIS — Z0000 Encounter for general adult medical examination without abnormal findings: Secondary | ICD-10-CM | POA: Diagnosis not present

## 2024-03-21 DIAGNOSIS — Z299 Encounter for prophylactic measures, unspecified: Secondary | ICD-10-CM | POA: Diagnosis not present

## 2024-03-21 DIAGNOSIS — E1159 Type 2 diabetes mellitus with other circulatory complications: Secondary | ICD-10-CM | POA: Diagnosis not present

## 2024-03-21 DIAGNOSIS — E1165 Type 2 diabetes mellitus with hyperglycemia: Secondary | ICD-10-CM | POA: Diagnosis not present

## 2024-04-10 DIAGNOSIS — Z7952 Long term (current) use of systemic steroids: Secondary | ICD-10-CM | POA: Diagnosis not present

## 2024-04-10 DIAGNOSIS — Z79899 Other long term (current) drug therapy: Secondary | ICD-10-CM | POA: Diagnosis not present

## 2024-05-01 ENCOUNTER — Other Ambulatory Visit: Payer: Self-pay | Admitting: *Deleted

## 2024-05-01 DIAGNOSIS — N5201 Erectile dysfunction due to arterial insufficiency: Secondary | ICD-10-CM | POA: Diagnosis not present

## 2024-05-01 DIAGNOSIS — N401 Enlarged prostate with lower urinary tract symptoms: Secondary | ICD-10-CM | POA: Diagnosis not present

## 2024-05-01 DIAGNOSIS — Z79899 Other long term (current) drug therapy: Secondary | ICD-10-CM

## 2024-05-01 DIAGNOSIS — E291 Testicular hypofunction: Secondary | ICD-10-CM | POA: Diagnosis not present

## 2024-05-02 ENCOUNTER — Ambulatory Visit: Payer: Self-pay | Admitting: Rheumatology

## 2024-05-02 LAB — COMPREHENSIVE METABOLIC PANEL WITH GFR
AG Ratio: 1.6 (calc) (ref 1.0–2.5)
ALT: 42 U/L (ref 9–46)
AST: 32 U/L (ref 10–35)
Albumin: 4.3 g/dL (ref 3.6–5.1)
Alkaline phosphatase (APISO): 49 U/L (ref 35–144)
BUN: 21 mg/dL (ref 7–25)
CO2: 29 mmol/L (ref 20–32)
Calcium: 9.3 mg/dL (ref 8.6–10.3)
Chloride: 96 mmol/L — ABNORMAL LOW (ref 98–110)
Creat: 0.82 mg/dL (ref 0.70–1.28)
Globulin: 2.7 g/dL (ref 1.9–3.7)
Glucose, Bld: 119 mg/dL — ABNORMAL HIGH (ref 65–99)
Potassium: 4 mmol/L (ref 3.5–5.3)
Sodium: 137 mmol/L (ref 135–146)
Total Bilirubin: 0.9 mg/dL (ref 0.2–1.2)
Total Protein: 7 g/dL (ref 6.1–8.1)
eGFR: 94 mL/min/1.73m2 (ref >=60)

## 2024-05-02 LAB — CBC WITH DIFFERENTIAL/PLATELET
Absolute Lymphocytes: 2284 {cells}/uL (ref 850–3900)
Absolute Monocytes: 691 {cells}/uL (ref 200–950)
Basophils Absolute: 28 {cells}/uL (ref 0–200)
Basophils Relative: 0.6 %
Eosinophils Absolute: 71 {cells}/uL (ref 15–500)
Eosinophils Relative: 1.5 %
HCT: 47.6 % (ref 39.4–51.1)
Hemoglobin: 15.7 g/dL (ref 13.2–17.1)
MCH: 29.3 pg (ref 27.0–33.0)
MCHC: 33 g/dL (ref 31.6–35.4)
MCV: 89 fL (ref 81.4–101.7)
MPV: 10.2 fL (ref 7.5–12.5)
Monocytes Relative: 14.7 %
Neutro Abs: 1626 {cells}/uL (ref 1500–7800)
Neutrophils Relative %: 34.6 %
Platelets: 219 Thousand/uL (ref 140–400)
RBC: 5.35 Million/uL (ref 4.20–5.80)
RDW: 14.6 % (ref 11.0–15.0)
Total Lymphocyte: 48.6 %
WBC: 4.7 Thousand/uL (ref 3.8–10.8)

## 2024-05-22 ENCOUNTER — Other Ambulatory Visit: Payer: Self-pay | Admitting: Physician Assistant

## 2024-05-22 DIAGNOSIS — L4050 Arthropathic psoriasis, unspecified: Secondary | ICD-10-CM

## 2024-05-22 NOTE — Telephone Encounter (Signed)
 Last Fill: 01/25/2024  Labs: 05/01/2024  Glucose 119 Chloride 96  TB Gold: 08/20/2023 TB Gold negative.   Next Visit: 07/04/2024  Last Visit: 01/27/2024  IK:Ednmpjupr arthropathy (HCC)   Current Dose per office note 01/27/2024: Humira  40 mg sq injections every 21 days   Okay to refill Humira ?

## 2024-06-16 ENCOUNTER — Ambulatory Visit: Attending: Orthopedic Surgery

## 2024-06-16 DIAGNOSIS — M6281 Muscle weakness (generalized): Secondary | ICD-10-CM | POA: Diagnosis present

## 2024-06-16 DIAGNOSIS — M542 Cervicalgia: Secondary | ICD-10-CM | POA: Diagnosis present

## 2024-06-16 NOTE — Patient Instructions (Signed)

## 2024-06-16 NOTE — Therapy (Addendum)
 " OUTPATIENT PHYSICAL THERAPY EVALUATION   Patient Name: Brandon Frazier MRN: [id number] DOB:10-15-1955, 69 y.o., male Today's Date: 06/16/2024  END OF SESSION:  PT End of Session - 06/16/24 0759     Visit Number 1    Number of Visits 16    Date for Recertification  08/11/24    Authorization Type [employer]    PT Start Time 0800    PT Stop Time 0840    PT Time Calculation (min) 40 min    Activity Tolerance Patient tolerated treatment well    Behavior During Therapy [facility] for tasks assessed/performed          Past Medical History:  Diagnosis Date   Anterior myocardial infarction (HCC) 1995   Anxiety    CAD (coronary artery disease)    Angioplasty of LAD 1995   Essential hypertension    GERD (gastroesophageal reflux disease)    Psoriatic arthritis (HCC)    Type 2 diabetes mellitus (HCC)    Past Surgical History:  Procedure Laterality Date   CARDIAC CATHETERIZATION     1995, 2001   COLONOSCOPY     COLONOSCOPY  07/04/2020   EYE SURGERY Bilateral    lasik   INSERTION OF MESH N/A 04/23/2016   Procedure: INSERTION OF MESH;  Surgeon: Alm Angle, MD;  Location: [facility] OR;  Service: General;  Laterality: N/A;   KNEE ARTHROPLASTY     left knee arthroscopy     1990, 1997   MEDIAL PARTIAL KNEE REPLACEMENT Right 05/2019   PARTIAL KNEE ARTHROPLASTY Left 02/16/2014   Procedure: LEFT KNEE UNI ARTHROPLASTY;  Surgeon: Fonda SHAUNNA Olmsted, MD;  Location: [facility];  Service: Orthopedics;  Laterality: Left;   SKIN BIOPSY  06/2023   UMBILICAL HERNIA REPAIR N/A 04/23/2016   Procedure: LAPAROSCOPIC UMBILICAL HERNIA;  Surgeon: Alm Angle, MD;  [facility] OR;  Service: General;  Laterality: N/A;   Patient Active Problem List   Diagnosis Date Noted   Onychomycosis 08/28/2019   Psoriatic arthropathy (HCC) 06/05/2016   History of hypertension 06/05/2016   History of diabetes mellitus 06/05/2016   History of coronary artery disease 06/05/2016   History of hyperlipidemia 06/05/2016    High risk medication use 06/05/2016   Status post left partial knee replacement 06/05/2016   Primary localized osteoarthrosis, lower leg 02/16/2014   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 He reports difficulty sleeping, driving, and turning his head. He denies any numbness tingling or associated weakness.   NEXT MD VISIT: February   PERTINENT HISTORY:  See above PMH  PAIN:  NPRS scale: 3/10 upon arrival Pain location: R shoulder/neck Pain description: constant, achy, dull Aggravating factors: turning head Relieving factors: rest, meds   PRECAUTIONS: ,  None  RED FLAGS: None   WEIGHT BEARING RESTRICTIONS:  No  FALLS:  Has patient fallen in last 6 months? No   OCCUPATION:  Retired; book keeping  PLOF:  Independent  PATIENT GOALS:  Abolish pain  OBJECTIVE:  Note: Objective measures were completed at Evaluation unless otherwise noted.  DIAGNOSTIC FINDINGS:  X-Ray performed at [facility]  PATIENT SURVEYS:   Patient-Specific Activity Scoring Scheme  0 represents unable to perform. 10 represents able to perform at prior level. 0 1 2 3 4 5 6 7 8 9  10 (Date and Score)   Activity Eval     1. Lying down   5    2. Driving 5     3.     4.    5.    Score 5    Total score = sum of the activity scores/number of activities Minimum detectable change (90%CI) for average score = 2 points Minimum detectable change (90%CI) for single activity score = 3 points     EDEMA:  No  POSTURE:  rounded shoulders, forward head, and increased thoracic kyphosis  GAIT: Assistive device utilized: None Level of assistance: Complete Independence Comments: No significant gait abnormality    CERVICAL ROM:   Active ROM A/PROM (deg) eval  Flexion 20  Extension 35  Right lateral flexion Limited 25%  Left lateral flexion Limited 25%  Right rotation 70  Left rotation 65   (Blank rows = not tested)   Shoulder AROM WFL in all planes UPPER EXTREMITY MMT:  MMT Right eval Left eval  Shoulder flexion 5 5  Shoulder extension    Shoulder abduction 5 4  Shoulder adduction    Shoulder extension    Shoulder internal rotation 5 5  Shoulder external rotation 4+ 4+  Middle trapezius    Lower trapezius    Elbow flexion    Elbow extension    Wrist flexion    Wrist extension    Wrist ulnar deviation    Wrist radial deviation    Wrist pronation    Wrist supination    Grip strength     (Blank rows = not tested)    SPECIAL TESTS:  Cervical Spurling's test: Negative  TTP to R Upper trapezius and Levator Scapulae (concordant pain sign)  FUNCTIONAL TESTS:   Deep Neck Flexor Test: 20sec  TREATMENT DATE:  Eval HEP creation and review with demonstration and trial set preformed, see below for details Selfcare:see education section below  Trigger Point Dry Needling Initial Treatment: Pt instructed on Dry Needling rational, procedures, and possible side effects. Pt instructed to expect mild to moderate muscle soreness later in the day and/or into the next day.  Pt instructed  in methods to reduce muscle soreness. Pt instructed to continue prescribed HEP. Because Dry Needling was performed over or adjacent to a lung field, pt was educated on S/S of pneumothorax and to seek immediate medical attention should they occur.  Patient was educated on signs and symptoms of infection and other risk factors and advised to seek medical attention should they occur.  Patient verbalized understanding of these instructions and education.  Patient Verbal Consent Given: Yes Education Handout Provided: Yes Muscles Treated: R upper trapezius and levator scapulae Electrical Stimulation Performed: No Treatment Response/Outcome: Pt tolerated session well, twitch response noted, with reports of improved motion and decrease pain. Expected soreness mentioned.        PATIENT EDUCATION: Education details: HEP, PT plan of care, selfcare Person educated: Patient Education method: Explanation, Demonstration, Verbal cues, and Handouts Education comprehension: verbalized understanding, further education recommended   HOME EXERCISE PROGRAM: Access Code: [email or web address] URL: [email or web address] Date: 06/16/2024 Prepared by: Massie Ada  Exercises - Seated Upper Trapezius Stretch  - 1 x daily - 7 x weekly - 1 sets - 3 reps - 30 sec hold - Seated Levator Scapulae Stretch  - 1 x daily - 7 x weekly - 1 sets - 3 reps - 30sec hold - Supine Deep Neck Flexor Training - Repetitions  - 1 x daily - 7 x weekly - 2 sets - 15 reps - 10s hold - Standing Bilateral Low Shoulder Row with Anchored Resistance  - 1 x daily - 7 x weekly - 3 sets - 10 reps  ASSESSMENT:  CLINICAL IMPRESSION: Patient referred to PT for cervicalgia consistent with Upper trapezius TrP. Patient will benefit from skilled PT to improve overall function and to address impairments and limitations listed below.  OBJECTIVE IMPAIRMENTS: decreased activity tolerance for ADL's, difficulty walking, decreased balance, decreased  endurance, decreased mobility, decreased ROM, decreased strength, impaired flexibility, impaired UE/Cervical use, and pain.  ACTIVITY LIMITATIONS: bending, liftting, walking, standing, cleaning, community activity, driving, reaching, carry, occupation  PERSONAL FACTORS: see above PMH  also affecting patient's functional outcome.  REHAB POTENTIAL: Excellent  CLINICAL DECISION MAKING: Stable/uncomplicated  EVALUATION COMPLEXITY: Low    GOALS: Short term PT Goals Target date: 07/14/2024   Pt will be I and compliant with HEP. Baseline: No HEP Goal status: New Pt will decrease pain by 25% overall Baseline:3 Goal status: New  Long term PT goals Target date:08/11/2024   Pt will improve ROM to [facility] to improve functional mobility Baseline:limited 25% or more Goal status: New  Pt will improve Patient specific functional scale (PSFS) to at least 7/10 to show improved function level Baseline:5 Goal status: New Pt will reduce pain to overall less than 1/10 with usual activity and work activity. Baseline:3 Goal status: New  Pt will improve DNF strength test to 30 seconds to show improved postural strength Baseline: 20sec Goal status: New  PLAN: PT FREQUENCY: 1-3 times per week   PT DURATION: 6-8 weeks  PLANNED INTERVENTIONS  97110-Therapeutic exercises, 97530- Therapeutic activity, [id number]- Neuromuscular re-education, 97535- Self Care, [id number]- Manual therapy, and Patient/Family education DN:The CPT codes for dry needling in physical therapy are: 79439:  20561  PLAN FOR NEXT SESSION: Continue postural strengthening, stretching, DN, and modalities as indicated.  Massie Ada PT, DPT 06/16/24 8:54 AM   "

## 2024-06-20 NOTE — Progress Notes (Unsigned)
 "  Office Visit Note  Patient: Brandon Frazier             Date of Birth: [birth year]           MRN: [id number]             PCP: Rosamond Leta NOVAK, MD Referring: Rosamond Leta NOVAK, MD Visit Date: 07/04/2024 Occupation: JUNA   Subjective:  No chief complaint on file.   History of Present Illness: Brandon Frazier is a 69 y.o. male ***     Activities of Daily Living:  Patient reports morning stiffness for *** {minute/hour:19697}.   Patient {ACTIONS;DENIES/REPORTS:21021675::Denies} nocturnal pain.  Difficulty dressing/grooming: {ACTIONS;DENIES/REPORTS:21021675::Denies} Difficulty climbing stairs: {ACTIONS;DENIES/REPORTS:21021675::Denies} Difficulty getting out of chair: {ACTIONS;DENIES/REPORTS:21021675::Denies} Difficulty using hands for taps, buttons, cutlery, and/or writing: {ACTIONS;DENIES/REPORTS:21021675::Denies}  No Rheumatology ROS completed.   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   Psoriasis 12/10/2010   Knee pain, bilateral 10/30/2010   Hip pain, bilateral 10/30/2010   Leg length discrepancy 10/30/2010    Past Medical History:  Diagnosis Date   Anterior myocardial infarction [facility]) 1995   Anxiety    CAD (coronary artery disease)    Angioplasty of LAD 1995   Essential hypertension    GERD (gastroesophageal reflux disease)    Psoriatic arthritis (HCC)    Type 2 diabetes mellitus (HCC)     Family History  Problem Relation Age of Onset   GER disease Mother    Heart disease Mother    Liver cancer Father    Arthritis Son     Psoriasis Son    Psoriasis Daughter    Past Surgical History:  Procedure Laterality Date   CARDIAC CATHETERIZATION     1995, 2001   COLONOSCOPY     COLONOSCOPY  07/04/2020   EYE SURGERY Bilateral    lasik   INSERTION OF MESH N/A 04/23/2016   Procedure: INSERTION OF MESH;  Surgeon: Alm Angle, MD;  Location: [facility] OR;  Service: General;  Laterality: N/A;   KNEE ARTHROPLASTY     left knee arthroscopy     1990, 1997   MEDIAL PARTIAL KNEE REPLACEMENT Right 05/2019   PARTIAL KNEE ARTHROPLASTY Left 02/16/2014   Procedure: LEFT KNEE UNI ARTHROPLASTY;  Surgeon: Fonda SHAUNNA Olmsted, MD;  Location: [facility];  Service: Orthopedics;  Laterality: Left;   SKIN BIOPSY  06/2023   UMBILICAL HERNIA REPAIR N/A 04/23/2016   Procedure: LAPAROSCOPIC UMBILICAL HERNIA;  Surgeon: Alm Angle, MD;  Location[facility] OR;  Service: General;  Laterality: N/A;   Social History[1] Social History   Social History Narrative   Not on file     Immunization History  Administered Date(s) Administered    sv, Bivalent, Protein Subunit Rsvpref,pf [place]low) 03/18/2022   Influenza,inj,Quad PF,6+ Mos 03/19/2017, 03/24/2018   Influenza-Unspecified 03/18/2022   Tdap 05/14/2018   Zoster Recombinant(Shingrix) 10/27/2021     Objective: Vital Signs: There were no vitals taken for this visit.   Physical Exam   Musculoskeletal Exam: ***  CDAI Exam: CDAI Score: -- Patient Global: --; Provider Global: -- Swollen: --; Tender: --  Joint Exam 07/04/2024   No joint exam has been documented for this visit   There is currently no information documented on the homunculus. Go to the Rheumatology activity and complete the homunculus joint exam.  Investigation: No additional findings.  Imaging: No results found.  Recent Labs: Lab Results  Component Value Date   WBC 4.7 05/01/2024   HGB 15.7 05/01/2024   PLT 219 05/01/2024   NA 137 05/01/2024   K 4.0 05/01/2024   CL 96 (L) 05/01/2024   CO2 29  05/01/2024   GLUCOSE 119 (H) 05/01/2024   BUN 21 05/01/2024   CREATININE 0.82 05/01/2024   BILITOT 0.9 05/01/2024   AST 32 05/01/2024   ALT 42 05/01/2024   PROT 7.0 05/01/2024   CALCIUM 9.3 05/01/2024   GFRAA 104 11/07/2020   QFTBGOLDPLUS NEGATIVE 08/20/2023    Speciality Comments: No specialty comments available.  Procedures:  No procedures performed Allergies: Aspirin and Codeine   Assessment / Plan:     Visit Diagnoses: No diagnosis found.  Orders: No orders of the defined types were placed in this encounter.  No orders of the defined types were placed in this encounter.   Face-to-face time spent with patient was *** minutes. Greater than 50% of time was spent in counseling and coordination of care.  Follow-Up Instructions: No follow-ups on file.   Daved JAYSON Gavel, CMA  Note - This record has been created using Animal nutritionist.  Chart creation errors have been sought, but may not always  have been located. Such creation errors do not reflect on  the standard of medical care.    [1]  Social History Tobacco Use   Smoking status: Former    Current packs/day: 0.00    Types: Cigarettes    Quit date: 06/27/1995    Years since quitting: 29.0    Passive exposure: Never   Smokeless tobacco: Never  Vaping Use   Vaping status: Never Used  Substance Use Topics   Alcohol  use: Not Currently   Drug use: No   "

## 2024-06-21 ENCOUNTER — Ambulatory Visit: Admitting: Physical Therapy

## 2024-06-21 ENCOUNTER — Encounter: Payer: Self-pay | Admitting: Physical Therapy

## 2024-06-21 DIAGNOSIS — M6281 Muscle weakness (generalized): Secondary | ICD-10-CM

## 2024-06-21 DIAGNOSIS — M542 Cervicalgia: Secondary | ICD-10-CM

## 2024-06-21 NOTE — Therapy (Signed)
 " OUTPATIENT PHYSICAL THERAPY TREATMENT   Patient Name: Brandon Frazier MRN: [id number] DOB:Feb 07, 1956, 69 y.o., male Today's Date: 06/21/2024  END OF SESSION:  PT End of Session - 06/21/24 1428     Visit Number 2    Number of Visits 16    Date for Recertification  08/11/24    Authorization Type [employer]    PT Start Time 1426    PT Stop Time 1510    PT Time Calculation (min) 44 min    Activity Tolerance Patient tolerated treatment well    Behavior During Therapy WFL for tasks assessed/performed          Past Medical History:  Diagnosis Date   Anterior myocardial infarction (HCC) 1995   Anxiety    CAD (coronary artery disease)    Angioplasty of LAD 1995   Essential hypertension    GERD (gastroesophageal reflux disease)    Psoriatic arthritis (HCC)    Type 2 diabetes mellitus (HCC)    Past Surgical History:  Procedure Laterality Date   CARDIAC CATHETERIZATION     1995, 2001   COLONOSCOPY     COLONOSCOPY  07/04/2020   EYE SURGERY Bilateral    lasik   INSERTION OF MESH N/A 04/23/2016   Procedure: INSERTION OF MESH;  Surgeon: Alm Angle, MD;  Location: [facility] OR;  Service: General;  Laterality: N/A;   KNEE ARTHROPLASTY     left knee arthroscopy     1990, 1997   MEDIAL PARTIAL KNEE REPLACEMENT Right 05/2019   PARTIAL KNEE ARTHROPLASTY Left 02/16/2014   Procedure: LEFT KNEE UNI ARTHROPLASTY;  Surgeon: Fonda SHAUNNA Olmsted, MD;  Location: [facility];  Service: Orthopedics;  Laterality: Left;   SKIN BIOPSY  06/2023   UMBILICAL HERNIA REPAIR N/A 04/23/2016   Procedure: LAPAROSCOPIC UMBILICAL HERNIA;  Surgeon: Alm Angle, MD;  Location: [facility] OR;  Service: General;  Laterality: N/A;   Patient Active Problem List   Diagnosis Date Noted   Onychomycosis 08/28/2019   Psoriatic arthropathy (HCC) 06/05/2016   History of hypertension 06/05/2016   History of diabetes mellitus 06/05/2016   History of coronary artery disease 06/05/2016   History of hyperlipidemia 06/05/2016    High risk medication use 06/05/2016   Status post left partial knee replacement 06/05/2016   Primary localized osteoarthrosis, lower leg 02/16/2014   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 Still feels it at times.   NEXT MD VISIT: February   PERTINENT HISTORY:  See above PMH  PAIN:  NPRS scale: 1/10 upon arrival Pain location: R shoulder/neck Pain description: constant, achy, dull Aggravating factors: turning head Relieving factors: rest, meds   PRECAUTIONS: ,  None  RED FLAGS: None   WEIGHT BEARING RESTRICTIONS:  No  FALLS:  Has patient fallen in last 6 months? No   OCCUPATION:  Retired; book keeping  PLOF:  Independent  PATIENT GOALS:  Abolish pain  OBJECTIVE:  Note: Objective measures were completed at Evaluation unless otherwise noted.  DIAGNOSTIC FINDINGS:  X-Ray performed at [facility]  PATIENT SURVEYS:   Patient-Specific Activity Scoring Scheme  0 represents unable to perform. 10 represents able to perform at prior level. 0 1 2 3 4 5 6 7 8 9  10 (Date and Score)   Activity Eval     1. Lying down  5    2. Driving 5     3.     4.    5.    Score 5    Total score = sum of the activity scores/number of activities Minimum detectable change (90%CI) for average score = 2 points Minimum detectable change (90%CI) for  single activity score = 3 points     EDEMA:  No  POSTURE:  rounded shoulders, forward head, and increased thoracic kyphosis  GAIT: Assistive device utilized: None Level of assistance: Complete Independence Comments: No significant gait abnormality    CERVICAL ROM:   Active ROM A/PROM (deg) eval  Flexion 20  Extension 35  Right lateral flexion Limited 25%  Left lateral flexion Limited 25%  Right rotation 70  Left rotation 65   (Blank rows = not tested)   Shoulder AROM WFL in all planes UPPER EXTREMITY MMT:  MMT Right eval Left eval  Shoulder flexion 5 5  Shoulder extension    Shoulder abduction 5 4  Shoulder adduction    Shoulder extension    Shoulder internal rotation 5 5  Shoulder external rotation 4+ 4+  Middle trapezius    Lower trapezius    Elbow flexion    Elbow extension    Wrist flexion    Wrist extension    Wrist ulnar deviation    Wrist radial deviation    Wrist pronation    Wrist supination    Grip strength     (Blank rows = not tested)    SPECIAL TESTS:  Cervical Spurling's test: Negative  TTP to R Upper trapezius and Levator Scapulae (concordant pain sign)  FUNCTIONAL TESTS:   Deep Neck Flexor Test: 20sec                                                                                                                             TREATMENT DATE:  06/21/24 Manual therapy: Trigger point release with deep pressure as well as IASTM to right upper  trap, supraspinatus Self care: showed how to use theracane as well as IASTM at home for self trigger point release Therex: Upper trap stretch 30 sec X 3 Levator stretch 30 sec X 3 Neck rotation stretch AAROM 5 sec X 10 bilat Seated cervical extension with thoracic extension arms crossed behind head 5 sec X 10 Standing bilat shoulder ER with scapular retractions 5 sec X 10  Eval HEP creation and review with demonstration and trial set preformed, see below for details Selfcare:see education  section below  Trigger Point Dry Needling Initial Treatment: Pt instructed on Dry Needling rational, procedures, and possible side effects. Pt instructed to expect mild to moderate muscle soreness later in the day and/or into the next day.  Pt instructed in methods to reduce muscle soreness. Pt instructed to continue prescribed HEP. Because Dry Needling was performed over or adjacent to a lung field, pt was educated on S/S of pneumothorax and to seek immediate medical attention should they occur.  Patient was educated on signs and symptoms of infection and other risk factors and advised to seek medical attention should they occur.  Patient verbalized understanding of these instructions and education.  Patient Verbal Consent Given: Yes Education Handout Provided: Yes Muscles Treated: R upper trapezius and levator scapulae Electrical Stimulation Performed: No Treatment Response/Outcome: Pt tolerated session well, twitch response noted, with reports of improved motion and decrease pain. Expected soreness mentioned.        PATIENT EDUCATION: Education details: HEP, PT plan of care, selfcare Person educated: Patient Education method: Explanation, Demonstration, Verbal cues, and Handouts Education comprehension: verbalized understanding, further education recommended   HOME EXERCISE PROGRAM: Access Code: [email or web address] URL: [email or web address] Date: 06/21/2024 Prepared by: Redell Moose  Exercises - Seated Upper Trapezius Stretch  - 1 x daily - 7 x weekly - 1 sets - 3 reps - 30 sec hold - Seated Levator Scapulae Stretch  - 1 x daily - 7 x weekly - 1 sets - 3 reps - 30sec hold - Supine Deep Neck Flexor Training - Repetitions  - 1 x daily - 7 x weekly - 2 sets - 15 reps - 10s hold - Standing Bilateral Low Shoulder Row with Anchored Resistance  - 1 x daily - 7 x weekly - 3 sets - 10 reps - Seated Assisted Cervical Rotation with Towel  - 2 x daily - 6 x weekly - 1 sets - 10 reps  - 5 hold - Seated Thoracic Lumbar Extension with Pectoralis Stretch  - 2 x daily - 6 x weekly - 1 sets - 10 reps - 5 sec hold - Standing Shoulder External Rotation with Resistance  - 2 x daily - 6 x weekly - 1 sets - 10 reps ASSESSMENT:  CLINICAL IMPRESSION: Patient was feeling better so did not DN today and instead worked on trigger point release with manual therapy and massage tools. I showed him how to replicate this at home. Added some additional HEP pictures to his home program today that he said felt good in session.    OBJECTIVE IMPAIRMENTS: decreased activity tolerance for ADL's, difficulty walking, decreased balance, decreased endurance, decreased mobility, decreased ROM, decreased strength, impaired flexibility, impaired UE/Cervical use, and pain.  ACTIVITY LIMITATIONS: bending, liftting, walking, standing, cleaning, community activity, driving, reaching, carry, occupation  PERSONAL FACTORS: see above PMH  also affecting patient's functional outcome.  REHAB POTENTIAL: Excellent  CLINICAL DECISION MAKING: Stable/uncomplicated  EVALUATION COMPLEXITY: Low    GOALS: Short term PT Goals Target date: 07/14/2024  Pt will be I and compliant with HEP. Baseline: No HEP Goal status: New Pt will decrease pain by 25% overall Baseline:3 Goal status: New  Long term PT goals Target date:08/11/2024   Pt will improve ROM to [facility] to improve functional mobility Baseline:limited 25% or more Goal status: New  Pt will improve Patient specific functional scale (PSFS) to at least 7/10 to show improved function level Baseline:5 Goal status: New Pt will reduce pain to overall less than 1/10 with usual activity and work activity. Baseline:3 Goal status: New  Pt will improve DNF strength test to 30 seconds to show improved postural strength Baseline: 20sec Goal status: New  PLAN: PT FREQUENCY: 1-3 times per week   PT DURATION: 6-8 weeks  PLANNED INTERVENTIONS  97110-Therapeutic  exercises, 97530- Therapeutic activity, [id number]- Neuromuscular re-education, 97535- Self Care, [id number]- Manual therapy, and Patient/Family education DN:The CPT codes for dry needling in physical therapy are: 79439:  20561   PLAN FOR NEXT SESSION: Continue postural strengthening, stretching, DN, and modalities as indicated.  Redell Moose, PT, DPT 06/21/24 2:56 PM    "

## 2024-06-28 ENCOUNTER — Encounter: Payer: Self-pay | Admitting: Physical Therapy

## 2024-06-28 ENCOUNTER — Ambulatory Visit: Admitting: Physical Therapy

## 2024-06-28 DIAGNOSIS — M6281 Muscle weakness (generalized): Secondary | ICD-10-CM

## 2024-06-28 DIAGNOSIS — M542 Cervicalgia: Secondary | ICD-10-CM

## 2024-06-28 NOTE — Therapy (Signed)
 " OUTPATIENT PHYSICAL THERAPY TREATMENT   Patient Name: Brandon Frazier MRN: [id number] DOB:[birth year], 69 y.o., male Today's Date: 06/28/2024  END OF SESSION:  PT End of Session - 06/28/24 1345     Visit Number 3    Number of Visits 16    Date for Recertification  08/11/24    Authorization Type [employer]    PT Start Time 1345    PT Stop Time 1425    PT Time Calculation (min) 40 min    Activity Tolerance Patient tolerated treatment well    Behavior During Therapy WFL for tasks assessed/performed          Past Medical History:  Diagnosis Date   Anterior myocardial infarction (HCC) 1995   Anxiety    CAD (coronary artery disease)    Angioplasty of LAD 1995   Essential hypertension    GERD (gastroesophageal reflux disease)    Psoriatic arthritis (HCC)    Type 2 diabetes mellitus (HCC)    Past Surgical History:  Procedure Laterality Date   CARDIAC CATHETERIZATION     1995, 2001   COLONOSCOPY     COLONOSCOPY  07/04/2020   EYE SURGERY Bilateral    lasik   INSERTION OF MESH N/A 04/23/2016   Procedure: INSERTION OF MESH;  Surgeon: Alm Angle, MD;  Location: [facility] OR;  Service: General;  Laterality: N/A;   KNEE ARTHROPLASTY     left knee arthroscopy     1990, 1997   MEDIAL PARTIAL KNEE REPLACEMENT Right 05/2019   PARTIAL KNEE ARTHROPLASTY Left 02/16/2014   Procedure: LEFT KNEE UNI ARTHROPLASTY;  Surgeon: Fonda SHAUNNA Olmsted, MD;  Location: [facility];  Service: Orthopedics;  Laterality: Left;   SKIN BIOPSY  06/2023   UMBILICAL HERNIA REPAIR N/A 04/23/2016   Procedure: LAPAROSCOPIC UMBILICAL HERNIA;  Surgeon: Alm Angle, MD;  Location: [facility] OR;  Service: General;  Laterality: N/A;   Patient Active Problem List   Diagnosis Date Noted   Onychomycosis 08/28/2019   Psoriatic arthropathy (HCC) 06/05/2016   History of hypertension 06/05/2016   History of diabetes mellitus 06/05/2016   History of coronary artery disease 06/05/2016   History of hyperlipidemia 06/05/2016    High risk medication use 06/05/2016   Status post left partial knee replacement 06/05/2016   Primary localized osteoarthrosis, lower leg 02/16/2014   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   NEXT MD VISIT: February   PERTINENT HISTORY:  See above PMH  PAIN:  NPRS scale: 1/10 upon arrival Pain location: R shoulder/neck Pain description: constant, achy, dull Aggravating factors: turning head Relieving factors: rest, meds   PRECAUTIONS: ,  None  RED FLAGS: None   WEIGHT BEARING RESTRICTIONS:  No  FALLS:  Has patient fallen in last 6 months? No   OCCUPATION:  Retired; book keeping  PLOF:  Independent  PATIENT GOALS:  Abolish pain  OBJECTIVE:  Note: Objective measures were completed at Evaluation unless otherwise noted.  DIAGNOSTIC FINDINGS:  X-Ray performed at [facility]  PATIENT SURVEYS:   Patient-Specific Activity Scoring Scheme  0 represents unable to perform. 10 represents able to perform at prior level. 0 1 2 3 4 5 6 7 8 9  10 (Date and Score)   Activity Eval     1. Lying down  5    2. Driving 5     3.     4.    5.    Score 5    Total score = sum of the activity scores/number of activities Minimum detectable change (90%CI) for average score  = 2 points Minimum detectable change (90%CI) for single activity score = 3 points     EDEMA:  No  POSTURE:  rounded shoulders, forward head, and increased thoracic kyphosis  GAIT: Assistive device utilized: None Level of assistance: Complete Independence Comments: No significant gait abnormality    CERVICAL ROM:   Active ROM A/PROM (deg) eval  Flexion 20  Extension 35  Right lateral flexion Limited 25%  Left lateral flexion Limited 25%  Right rotation 70  Left rotation 65   (Blank rows = not tested)   Shoulder AROM WFL in all planes UPPER EXTREMITY MMT:  MMT Right eval Left eval  Shoulder flexion 5 5  Shoulder extension    Shoulder abduction 5 4  Shoulder adduction    Shoulder extension    Shoulder internal rotation 5 5  Shoulder external rotation 4+ 4+  Middle trapezius    Lower trapezius    Elbow flexion    Elbow extension    Wrist flexion    Wrist extension    Wrist ulnar deviation    Wrist radial deviation    Wrist pronation    Wrist supination    Grip strength     (Blank rows = not tested)    SPECIAL TESTS:  Cervical Spurling's test: Negative  TTP to R Upper trapezius and Levator Scapulae (concordant pain sign)  FUNCTIONAL TESTS:   Deep Neck Flexor Test: 20sec                                                                                                                             TREATMENT DATE:  06/28/24 Manual therapy: Trigger point release  with deep pressure as well as IASTM to right upper trap, supraspinatus  Therex: Upper trap stretch 30 sec X 3 bilat Levator stretch 30 sec X 2 bilat Neck rotation stretch AAROM 5 sec X 10 bilat Seated cervical extension with thoracic extension arms crossed behind head 5 sec X 10  Theractivity UBE 5 min push/pull, switching directions half way Standing bilat shoulder ER with scapular retractions red 2 X 10 reps Standing bilat shoulder horizontal abduction red  2X10 reps  06/21/24 Manual  therapy: Trigger point release with deep pressure as well as IASTM to right upper trap, supraspinatus Self care: showed how to use theracane as well as IASTM at home for self trigger point release Therex: Upper trap stretch 30 sec X 3 Levator stretch 30 sec X 3 Neck rotation stretch AAROM 5 sec X 10 bilat Seated cervical extension with thoracic extension arms crossed behind head 5 sec X 10 Standing bilat shoulder ER with scapular retractions 5 sec X 10  Eval HEP creation and review with demonstration and trial set preformed, see below for details Selfcare:see education section below  Trigger Point Dry Needling Initial Treatment: Pt instructed on Dry Needling rational, procedures, and possible side effects. Pt instructed to expect mild to moderate muscle soreness later in the day and/or into the next day.  Pt instructed in methods to reduce muscle soreness. Pt instructed to continue prescribed HEP. Because Dry Needling was performed over or adjacent to a lung field, pt was educated on S/S of pneumothorax and to seek immediate medical attention should they occur.  Patient was educated on signs and symptoms of infection and other risk factors and advised to seek medical attention should they occur.  Patient verbalized understanding of these instructions and education.  Patient Verbal Consent Given: Yes Education Handout Provided: Yes Muscles Treated: R upper trapezius and levator scapulae Electrical Stimulation Performed: No Treatment Response/Outcome: Pt tolerated session well, twitch response noted, with reports of improved motion and decrease pain. Expected soreness mentioned.        PATIENT EDUCATION: Education details: HEP, PT plan of care, selfcare Person educated: Patient Education method: Explanation, Demonstration, Verbal cues, and Handouts Education comprehension: verbalized understanding, further education recommended   HOME EXERCISE PROGRAM: Access Code:  [email or web address] URL: [email or web address] Date: 06/21/2024 Prepared by: Redell Moose  Exercises - Seated Upper Trapezius Stretch  - 1 x daily - 7 x weekly - 1 sets - 3 reps - 30 sec hold - Seated Levator Scapulae Stretch  - 1 x daily - 7 x weekly - 1 sets - 3 reps - 30sec hold - Supine Deep Neck Flexor Training - Repetitions  - 1 x daily - 7 x weekly - 2 sets - 15 reps - 10s hold - Standing Bilateral Low Shoulder Row with Anchored Resistance  - 1 x daily - 7 x weekly - 3 sets - 10 reps - Seated Assisted Cervical Rotation with Towel  - 2 x daily - 6 x weekly - 1 sets - 10 reps - 5 hold - Seated Thoracic Lumbar Extension with Pectoralis Stretch  - 2 x daily - 6 x weekly - 1 sets - 10 reps - 5 sec hold - Standing Shoulder External Rotation with Resistance  - 2 x daily - 6 x weekly - 1 sets - 10 reps ASSESSMENT:  CLINICAL IMPRESSION: Good subjective improvements noted in pain and function and he has been doing his HEP. He has now met his short term PT goals.   OBJECTIVE IMPAIRMENTS: decreased  activity tolerance for ADL's, difficulty walking, decreased balance, decreased endurance, decreased mobility, decreased ROM, decreased strength, impaired flexibility, impaired UE/Cervical use, and pain.  ACTIVITY LIMITATIONS: bending, liftting, walking, standing, cleaning, community activity, driving, reaching, carry, occupation  PERSONAL FACTORS: see above PMH  also affecting patient's functional outcome.  REHAB POTENTIAL: Excellent  CLINICAL DECISION MAKING: Stable/uncomplicated  EVALUATION COMPLEXITY: Low    GOALS: Short term PT Goals Target date: 07/14/2024   Pt will be I and compliant with HEP. Baseline: No HEP Goal status: New Pt will decrease pain by 25% overall Baseline:3 Goal status: New  Long term PT goals Target date:08/11/2024   Pt will improve ROM to [facility] to improve functional mobility Baseline:limited 25% or more Goal status: New  Pt will improve Patient specific  functional scale (PSFS) to at least 7/10 to show improved function level Baseline:5 Goal status: New Pt will reduce pain to overall less than 1/10 with usual activity and work activity. Baseline:3 Goal status: New  Pt will improve DNF strength test to 30 seconds to show improved postural strength Baseline: 20sec Goal status: New  PLAN: PT FREQUENCY: 1-3 times per week   PT DURATION: 6-8 weeks  PLANNED INTERVENTIONS  97110-Therapeutic exercises, 97530- Therapeutic activity, [id number]- Neuromuscular re-education, 97535- Self Care, [id number]- Manual therapy, and Patient/Family education DN:The CPT codes for dry needling in physical therapy are: 79439:  [id number]   PLAN FOR NEXT SESSION: Continue postural strengthening, stretching, DN, and modalities as indicated.  Redell Moose, PT, DPT 06/28/24 1:46 PM    "

## 2024-07-04 ENCOUNTER — Ambulatory Visit: Admitting: Rheumatology

## 2024-07-04 DIAGNOSIS — L409 Psoriasis, unspecified: Secondary | ICD-10-CM

## 2024-07-04 DIAGNOSIS — L4050 Arthropathic psoriasis, unspecified: Secondary | ICD-10-CM

## 2024-07-04 DIAGNOSIS — M19041 Primary osteoarthritis, right hand: Secondary | ICD-10-CM

## 2024-07-04 DIAGNOSIS — R7989 Other specified abnormal findings of blood chemistry: Secondary | ICD-10-CM

## 2024-07-04 DIAGNOSIS — Z96652 Presence of left artificial knee joint: Secondary | ICD-10-CM

## 2024-07-04 DIAGNOSIS — Z96651 Presence of right artificial knee joint: Secondary | ICD-10-CM

## 2024-07-04 DIAGNOSIS — Z79899 Other long term (current) drug therapy: Secondary | ICD-10-CM

## 2024-07-04 DIAGNOSIS — E559 Vitamin D deficiency, unspecified: Secondary | ICD-10-CM

## 2024-07-04 DIAGNOSIS — Z8639 Personal history of other endocrine, nutritional and metabolic disease: Secondary | ICD-10-CM

## 2024-07-04 DIAGNOSIS — Z8679 Personal history of other diseases of the circulatory system: Secondary | ICD-10-CM

## 2024-07-13 ENCOUNTER — Ambulatory Visit: Admitting: Physical Therapy
